# Patient Record
Sex: Male | Born: 1940 | Race: White | Hispanic: No | Marital: Married | Smoking: Former smoker
Health system: Southern US, Community
[De-identification: ages and names within clinical notes are randomized; demographics above are authoritative.]

## PROBLEM LIST (undated history)

## (undated) DIAGNOSIS — J309 Allergic rhinitis, unspecified: Secondary | ICD-10-CM

## (undated) DIAGNOSIS — N200 Calculus of kidney: Secondary | ICD-10-CM

## (undated) DIAGNOSIS — G4700 Insomnia, unspecified: Secondary | ICD-10-CM

## (undated) DIAGNOSIS — I219 Acute myocardial infarction, unspecified: Secondary | ICD-10-CM

## (undated) DIAGNOSIS — G4730 Sleep apnea, unspecified: Secondary | ICD-10-CM

## (undated) DIAGNOSIS — D472 Monoclonal gammopathy: Secondary | ICD-10-CM

## (undated) DIAGNOSIS — I25810 Atherosclerosis of coronary artery bypass graft(s) without angina pectoris: Secondary | ICD-10-CM

## (undated) DIAGNOSIS — K219 Gastro-esophageal reflux disease without esophagitis: Secondary | ICD-10-CM

## (undated) DIAGNOSIS — T7840XA Allergy, unspecified, initial encounter: Secondary | ICD-10-CM

## (undated) DIAGNOSIS — G40909 Epilepsy, unspecified, not intractable, without status epilepticus: Secondary | ICD-10-CM

## (undated) DIAGNOSIS — M810 Age-related osteoporosis without current pathological fracture: Secondary | ICD-10-CM

## (undated) DIAGNOSIS — K2270 Barrett's esophagus without dysplasia: Secondary | ICD-10-CM

## (undated) DIAGNOSIS — Z951 Presence of aortocoronary bypass graft: Secondary | ICD-10-CM

## (undated) DIAGNOSIS — E785 Hyperlipidemia, unspecified: Secondary | ICD-10-CM

## (undated) DIAGNOSIS — H25019 Cortical age-related cataract, unspecified eye: Secondary | ICD-10-CM

## (undated) DIAGNOSIS — H409 Unspecified glaucoma: Secondary | ICD-10-CM

## (undated) DIAGNOSIS — I10 Essential (primary) hypertension: Secondary | ICD-10-CM

## (undated) HISTORY — DX: Atherosclerosis of coronary artery bypass graft(s) without angina pectoris: I25.810

## (undated) HISTORY — DX: Epilepsy, unspecified, not intractable, without status epilepticus: G40.909

## (undated) HISTORY — DX: Essential (primary) hypertension: I10

## (undated) HISTORY — PX: CARDIAC SURGERY: SHX584

## (undated) HISTORY — PX: OTHER SURGICAL HISTORY: SHX169

## (undated) HISTORY — PX: FRACTURE SURGERY: SHX138

## (undated) HISTORY — DX: Cortical age-related cataract, unspecified eye: H25.019

## (undated) HISTORY — DX: Allergic rhinitis, unspecified: J30.9

## (undated) HISTORY — PX: VASECTOMY: SHX75

## (undated) HISTORY — DX: Barrett's esophagus without dysplasia: K22.70

## (undated) HISTORY — DX: Acute myocardial infarction, unspecified: I21.9

## (undated) HISTORY — DX: Sleep apnea, unspecified: G47.30

## (undated) HISTORY — DX: Gastro-esophageal reflux disease without esophagitis: K21.9

## (undated) HISTORY — PX: CORONARY ARTERY BYPASS GRAFT: SHX141

## (undated) HISTORY — DX: Insomnia, unspecified: G47.00

## (undated) HISTORY — DX: Monoclonal gammopathy: D47.2

## (undated) HISTORY — DX: Age-related osteoporosis without current pathological fracture: M81.0

## (undated) HISTORY — PX: TONSILLECTOMY: SUR1361

## (undated) HISTORY — DX: Hyperlipidemia, unspecified: E78.5

## (undated) HISTORY — PX: ANKLE SURGERY: SHX546

## (undated) HISTORY — DX: Presence of aortocoronary bypass graft: Z95.1

## (undated) HISTORY — DX: Calculus of kidney: N20.0

## (undated) HISTORY — DX: Unspecified glaucoma: H40.9

## (undated) HISTORY — DX: Allergy, unspecified, initial encounter: T78.40XA

---

## 2006-08-28 ENCOUNTER — Ambulatory Visit: Payer: Self-pay | Admitting: Otolaryngology

## 2011-12-07 ENCOUNTER — Emergency Department: Payer: Self-pay | Admitting: Emergency Medicine

## 2011-12-11 ENCOUNTER — Ambulatory Visit: Payer: Self-pay | Admitting: Orthopedic Surgery

## 2013-06-18 DIAGNOSIS — D696 Thrombocytopenia, unspecified: Secondary | ICD-10-CM | POA: Insufficient documentation

## 2014-04-20 DIAGNOSIS — I340 Nonrheumatic mitral (valve) insufficiency: Secondary | ICD-10-CM | POA: Insufficient documentation

## 2015-01-23 NOTE — Op Note (Signed)
PATIENT NAME:  Richard Summers, Richard Summers MR#:  [id number] DATE OF BIRTH:  1940/12/26  DATE OF PROCEDURE:  12/11/2011  PREOPERATIVE DIAGNOSIS: Trimalleolar right ankle fracture with metaphyseal extension.   POSTOPERATIVE DIAGNOSIS: Trimalleolar right ankle fracture with metaphyseal extension.   PROCEDURE: Open reduction and internal fixation medial and lateral malleoli.   SURGEON: Laurene Footman, MD  ANESTHESIA: Spinal.  DESCRIPTION OF PROCEDURE: The patient was brought to the Operating Room. The wound was checked, and there was a small fracture blister laterally that was not going to interfere with the incision, and so the patient was then given a spinal anesthetic. The right leg was prepped and draped in the usual sterile fashion with a tourniquet applied to the upper thigh and a bump underneath the right buttock. The tourniquet was inflated at the start of the case. The distal fibula was exposed and subcutaneous tissue elevated. A plate was contoured from the [employer] ALPS system with three locking screws placed in the distal fibula. The proximal plate was then exposed with a proximal incision getting the plate over the proximal fibular shaft. A K-wire was used to hold it in position. After getting the plate in the appropriate position, three nonlocking screws were placed proximally and the three locking screws distally into the lateral malleolus. This gave anatomic alignment to the fibula on AP and lateral projections. An anteromedial incision was then made to the distal tibia and medial malleolus. After exposure, a distal medial plate from the ALPS system was placed subcutaneously and a nonlocking screw inserted to lock the plate down. At the distal metaphysis, approximately three nonlocking screws were inserted to hold the plate, three nonlocking screws proximally to get the plate down to the bone. Multiple locking screws were then inserted distally to provide adequate buttress to the medial and metaphyseal  fractures. Essentially anatomic alignment appeared to have been obtained on both AP and lateral projections. The wounds were thoroughly irrigated and closed with 2-0 Vicryl subcutaneously and skin staples. Xeroform, 4 x 4s, Webril and stirrup splint were applied and a new bandage applied to a previous toe laceration. Condition to the recovery room  was stable.   ESTIMATED BLOOD LOSS: Minimal.    TOURNIQUET TIME: 65 minutes at 300 mmHg.   ____________________________ Laurene Footman, MD mjm:cbb D: 12/11/2011 16:15:08 ET T: 12/11/2011 17:49:25 ET JOB#: [id number]  cc: Laurene Footman, MD, <Dictator> Laurene Footman MD ELECTRONICALLY SIGNED 12/11/2011 18:30

## 2015-04-09 DIAGNOSIS — G4733 Obstructive sleep apnea (adult) (pediatric): Secondary | ICD-10-CM | POA: Insufficient documentation

## 2015-04-09 DIAGNOSIS — R7303 Prediabetes: Secondary | ICD-10-CM | POA: Insufficient documentation

## 2017-02-14 DIAGNOSIS — M79672 Pain in left foot: Secondary | ICD-10-CM | POA: Insufficient documentation

## 2017-07-25 DIAGNOSIS — M48061 Spinal stenosis, lumbar region without neurogenic claudication: Secondary | ICD-10-CM | POA: Insufficient documentation

## 2018-06-24 ENCOUNTER — Ambulatory Visit: Admit: 2018-06-24 | Payer: Self-pay | Admitting: Internal Medicine

## 2018-06-24 SURGERY — COLONOSCOPY WITH PROPOFOL
Anesthesia: General

## 2018-10-02 ENCOUNTER — Other Ambulatory Visit
Admission: RE | Admit: 2018-10-02 | Discharge: 2018-10-02 | Disposition: A | Discharge status: Home / Self Care | Payer: Medicare Other | Source: Ambulatory Visit | Attending: Internal Medicine | Admitting: Internal Medicine

## 2018-10-02 DIAGNOSIS — Z0000 Encounter for general adult medical examination without abnormal findings: Secondary | ICD-10-CM | POA: Diagnosis present

## 2018-10-02 DIAGNOSIS — M791 Myalgia, unspecified site: Secondary | ICD-10-CM | POA: Diagnosis present

## 2018-10-02 LAB — CK: CK TOTAL: 88 U/L (ref 49–397)

## 2019-03-18 DIAGNOSIS — I25810 Atherosclerosis of coronary artery bypass graft(s) without angina pectoris: Secondary | ICD-10-CM | POA: Insufficient documentation

## 2019-04-27 DIAGNOSIS — I255 Ischemic cardiomyopathy: Secondary | ICD-10-CM | POA: Insufficient documentation

## 2019-05-28 ENCOUNTER — Other Ambulatory Visit: Payer: Self-pay

## 2019-05-29 ENCOUNTER — Encounter: Payer: Self-pay | Admitting: Oncology

## 2019-05-29 ENCOUNTER — Inpatient Hospital Stay: Payer: Medicare Other

## 2019-05-29 ENCOUNTER — Inpatient Hospital Stay: Payer: Medicare Other | Attending: Oncology | Admitting: Oncology

## 2019-05-29 ENCOUNTER — Other Ambulatory Visit: Payer: Self-pay

## 2019-05-29 VITALS — BP 163/81 | HR 56 | Temp 98.0°F | Resp 16 | Wt 169.9 lb

## 2019-05-29 DIAGNOSIS — E785 Hyperlipidemia, unspecified: Secondary | ICD-10-CM | POA: Diagnosis not present

## 2019-05-29 DIAGNOSIS — G40909 Epilepsy, unspecified, not intractable, without status epilepticus: Secondary | ICD-10-CM

## 2019-05-29 DIAGNOSIS — Z79899 Other long term (current) drug therapy: Secondary | ICD-10-CM | POA: Diagnosis not present

## 2019-05-29 DIAGNOSIS — Z951 Presence of aortocoronary bypass graft: Secondary | ICD-10-CM | POA: Diagnosis not present

## 2019-05-29 DIAGNOSIS — Z87891 Personal history of nicotine dependence: Secondary | ICD-10-CM | POA: Diagnosis not present

## 2019-05-29 DIAGNOSIS — I10 Essential (primary) hypertension: Secondary | ICD-10-CM | POA: Diagnosis not present

## 2019-05-29 DIAGNOSIS — M810 Age-related osteoporosis without current pathological fracture: Secondary | ICD-10-CM | POA: Diagnosis not present

## 2019-05-29 DIAGNOSIS — D472 Monoclonal gammopathy: Secondary | ICD-10-CM | POA: Diagnosis present

## 2019-05-29 DIAGNOSIS — Z7982 Long term (current) use of aspirin: Secondary | ICD-10-CM

## 2019-05-29 DIAGNOSIS — I2510 Atherosclerotic heart disease of native coronary artery without angina pectoris: Secondary | ICD-10-CM | POA: Insufficient documentation

## 2019-05-29 DIAGNOSIS — I252 Old myocardial infarction: Secondary | ICD-10-CM | POA: Insufficient documentation

## 2019-05-29 LAB — CBC WITH DIFFERENTIAL/PLATELET
Abs Immature Granulocytes: 0.02 10*3/uL (ref 0.00–0.07)
Basophils Absolute: 0 10*3/uL (ref 0.0–0.1)
Basophils Relative: 1 %
Eosinophils Absolute: 0.2 10*3/uL (ref 0.0–0.5)
Eosinophils Relative: 4 %
HCT: 46.2 % (ref 39.0–52.0)
Hemoglobin: 16.1 g/dL (ref 13.0–17.0)
Immature Granulocytes: 0 %
Lymphocytes Relative: 16 %
Lymphs Abs: 1 10*3/uL (ref 0.7–4.0)
MCH: 32.9 pg (ref 26.0–34.0)
MCHC: 34.8 g/dL (ref 30.0–36.0)
MCV: 94.5 fL (ref 80.0–100.0)
Monocytes Absolute: 0.6 10*3/uL (ref 0.1–1.0)
Monocytes Relative: 10 %
Neutro Abs: 4.2 10*3/uL (ref 1.7–7.7)
Neutrophils Relative %: 69 %
Platelets: 146 10*3/uL — ABNORMAL LOW (ref 150–400)
RBC: 4.89 MIL/uL (ref 4.22–5.81)
RDW: 12.1 % (ref 11.5–15.5)
WBC: 6.1 10*3/uL (ref 4.0–10.5)
nRBC: 0 % (ref 0.0–0.2)

## 2019-05-29 LAB — COMPREHENSIVE METABOLIC PANEL
ALT: 22 U/L (ref 0–44)
AST: 18 U/L (ref 15–41)
Albumin: 4.6 g/dL (ref 3.5–5.0)
Alkaline Phosphatase: 74 U/L (ref 38–126)
Anion gap: 9 (ref 5–15)
BUN: 29 mg/dL — ABNORMAL HIGH (ref 8–23)
CO2: 26 mmol/L (ref 22–32)
Calcium: 9.8 mg/dL (ref 8.9–10.3)
Chloride: 107 mmol/L (ref 98–111)
Creatinine, Ser: 0.84 mg/dL (ref 0.61–1.24)
GFR calc Af Amer: >60 mL/min (ref >60)
GFR calc non Af Amer: >60 mL/min (ref >60)
Glucose, Bld: 122 mg/dL — ABNORMAL HIGH (ref 70–99)
Potassium: 4.3 mmol/L (ref 3.5–5.1)
Sodium: 142 mmol/L (ref 135–145)
Total Bilirubin: 1.1 mg/dL (ref 0.3–1.2)
Total Protein: 7.9 g/dL (ref 6.5–8.1)

## 2019-05-29 NOTE — Progress Notes (Addendum)
Hematology/Oncology Consult note [facility] Telephone:([phone] [phone]  Patient Care Team: Harrel Lemon, MD as PCP - General (Internal Medicine)   Name of the patient: Richard Summers  [id number]  1941-03-17    Reason for referral-abnormal M protein found on SPEP   Referring physician-Dr. Manuella Ghazi  Date of visit: 05/29/19   History of presenting illness- Patient is a 78 year old male with a past medical history significant for hyperlipidemia who has been having right lower extremity weakness and was recently seen by neurology.  Patient states that he was very active up until a year ago when he used to exercise in an apartment bike for 3 hours but presently is barely able to do it for 10 minutes.  He still ambulates independently but after he stands up for a while he feels as if his legs are giving way.  He did undergo nerve conduction studies with Dr. Manuella Ghazi as well.  As a work-up of his right lower extremity weakness patient had an SPEP checked which showed a monoclonal protein of 0.6 g IgM kappa.  Of note patient has a normal CBC and a CMP in April 2020.  His calcium levels in the past have also been normal.  He has been referred to [place] for abnormal SPEP  ECOG PS- 1  Pain scale- 0   Review of systems- Review of Systems  Constitutional: Positive for malaise/fatigue. Negative for chills, fever and weight loss.  HENT: Negative for congestion, ear discharge and nosebleeds.   Eyes: Negative for blurred vision.  Respiratory: Negative for cough, hemoptysis, sputum production, shortness of breath and wheezing.   Cardiovascular: Negative for chest pain, palpitations, orthopnea and claudication.  Gastrointestinal: Negative for abdominal pain, blood in stool, constipation, diarrhea, heartburn, melena, nausea and vomiting.  Genitourinary: Negative for dysuria, flank pain, frequency, hematuria and urgency.  Musculoskeletal: Negative for back pain, joint pain  and myalgias.  Skin: Negative for rash.  Neurological: Positive for weakness (Bilateral lower extremity weakness). Negative for dizziness, tingling, focal weakness, seizures and headaches.  Endo/Heme/Allergies: Does not bruise/bleed easily.  Psychiatric/Behavioral: Negative for depression and suicidal ideas. The patient does not have insomnia.     Allergies  Allergen Reactions  . Penicillins Anaphylaxis, Hives and Other (See Comments)  . Sulfa Antibiotics Anaphylaxis, Hives and Other (See Comments)  . Ezetimibe Other (See Comments)  . Rosuvastatin Other (See Comments)  . Statins     Other reaction(s): Other (See Comments) Confusion and mania     There are no active problems to display for this patient.    Past Medical History:  Diagnosis Date  . Allergic rhinitis   . Allergy   . Barrett's esophagus   . Cataract cortical, senile   . 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 Epilepsy ([place])   . GERD (gastroesophageal reflux disease)   . Glaucoma   . Hyperlipidemia   . Hypertension   . Insomnia   . Myocardial infarction ([place])   . Nephrolithiasis   . Osteoporosis   . S/P triple vessel bypass   . Sleep apnea      Past Surgical History:  Procedure Laterality Date  . ANKLE SURGERY    . broken leg    . CARDIAC SURGERY    . CORONARY ARTERY BYPASS GRAFT     triple  . FRACTURE SURGERY     Fibula and Tibia. Plates and screws installed  . TONSILLECTOMY    . triple bypass    .  VASECTOMY      Social History   Socioeconomic History  . Marital status: Married    Spouse name: Not on file  . Number of children: Not on file  . Years of education: Not on file  . Highest education level: Not on file  Occupational History  . Not on file  Social Needs  . Financial resource strain: Not on file  . Food insecurity    Worry: Not on file    Inability: Not on file  . Transportation needs    Medical: Not on file    Non-medical: Not on file   Tobacco Use  . Smoking status: Former Smoker    Types: 64, Pipe    Quit date: 10/29/2007    Years since quitting: 11.5  . Smokeless tobacco: Never Used  Substance and Sexual Activity  . Alcohol use: Yes  . Drug use: Never  . Sexual activity: Not Currently  Lifestyle  . Physical activity    Days per week: Not on file    Minutes per session: Not on file  . Stress: Not on file  Relationships  . Social Herbalist on phone: Not on file    Gets together: Not on file    Attends religious service: Not on file    Active member of club or organization: Not on file    Attends meetings of clubs or organizations: Not on file    Relationship status: Not on file  . Intimate partner violence    Fear of current or ex partner: Not on file    Emotionally abused: Not on file    Physically abused: Not on file    Forced sexual activity: Not on file  Other Topics Concern  . Not on file  Social History Narrative  . Not on file     Family History  Problem Relation Age of Onset  . Kidney cancer Father      Current Outpatient Medications:  .  aspirin EC 81 MG tablet, Take 81 mg by mouth daily., Disp: , Rfl:  .  azelastine (ASTELIN) 0.1 % nasal spray, Place into the nose., Disp: , Rfl:  .  cholestyramine (QUESTRAN) 4 GM/DOSE powder, Take 27 grams by mouth twice a day before meals. Do not take more than 54 grams per 24 hours., Disp: , Rfl:  .  hydrochlorothiazide (HYDRODIURIL) 25 MG tablet, Take by mouth., Disp: , Rfl:  .  latanoprost (XALATAN) 0.005 % ophthalmic solution, , Disp: , Rfl:  .  levETIRAcetam (KEPPRA XR) 500 MG 24 hr tablet, Take by mouth., Disp: , Rfl:  .  Loratadine 10 MG CAPS, Take by mouth., Disp: , Rfl:  .  losartan (COZAAR) 100 MG tablet, Take by mouth., Disp: , Rfl:  .  montelukast (SINGULAIR) 10 MG tablet, Take by mouth., Disp: , Rfl:  .  niacin (NIASPAN) 500 MG CR tablet, Take by mouth., Disp: , Rfl:  .  pantoprazole (PROTONIX) 40 MG tablet, Take by mouth.,  Disp: , Rfl:  .  Vitamin D, Ergocalciferol, (DRISDOL) 1.25 MG (50000 UT) CAPS capsule, Take by mouth., Disp: , Rfl:  .  clindamycin (CLEOCIN) 150 MG capsule, as needed For dental PRN, Disp: , Rfl:  .  clotrimazole (LOTRIMIN) 1 % cream, , Disp: , Rfl:    Physical exam:  Vitals:   05/29/19 1049  BP: (!) 163/81  Pulse: (!) 56  Resp: 16  Temp: 98 F (36.7 C)  TempSrc: Tympanic  SpO2: 96%  Weight: 169 lb 14.4 oz (77.1 kg)   Physical Exam Constitutional:      General: He is not in acute distress. HENT:     Head: Normocephalic and atraumatic.  Eyes:     Pupils: Pupils are equal, round, and reactive to light.  Neck:     Musculoskeletal: Normal range of motion.  Cardiovascular:     Rate and Rhythm: Normal rate and regular rhythm.     Heart sounds: Normal heart sounds.  Pulmonary:     Effort: Pulmonary effort is normal.     Breath sounds: Normal breath sounds.  Abdominal:     General: Bowel sounds are normal.     Palpations: Abdomen is soft.  Skin:    General: Skin is warm and dry.  Neurological:     Mental Status: He is alert and oriented to person, place, and time.     Sensory: No sensory deficit.     Motor: No weakness.     Gait: Gait normal.        CMP Latest Ref Rng & Units 05/29/2019  Glucose 70 - 99 mg/dL 122(H)  BUN 8 - 23 mg/dL 29(H)  Creatinine 0.61 - 1.24 mg/dL 0.84  Sodium 135 - 145 mmol/L 142  Potassium 3.5 - 5.1 mmol/L 4.3  Chloride 98 - 111 mmol/L 107  CO2 22 - 32 mmol/L 26  Calcium 8.9 - 10.3 mg/dL 9.8  Total Protein 6.5 - 8.1 g/dL 7.9  Total Bilirubin 0.3 - 1.2 mg/dL 1.1  Alkaline Phos 38 - 126 U/L 74  AST 15 - 41 U/L 18  ALT 0 - 44 U/L 22   CBC Latest Ref Rng & Units 05/29/2019  WBC 4.0 - 10.5 K/uL 6.1  Hemoglobin 13.0 - 17.0 g/dL 16.1  Hematocrit 39.0 - 52.0 % 46.2  Platelets 150 - 400 K/uL 146(L)   Assessment and plan- Patient is a 78 y.o. male referred for abnormal SPEP  Patient noted to have a small M protein of 0.6 g IgM kappa when  SPEP was checked as a part of work-up for his bilateral lower extremity weakness.  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  I do not feel that his MGUS is related to his bilateral lower extremity weakness.  He does not have any focal neurological deficit.  He has not had any spine imaging yet and that may be worth pursuing if his symptoms persist but I will defer those decisions to Dr. Edwina Barth and Dr. Manuella Ghazi.  I do not think that his MGUS is contributing to his symptoms presently.  However to work-up his MGUS I will check a CBC with differential, CMP, myeloma panel, serum free light chains and random urine protein electrophoresis.  I will see him back in 2 weeks time to discuss the results of his blood work and further management   Thank you for this kind referral and the opportunity to participate in the care of this patient   Visit Diagnosis 1. MGUS (monoclonal gammopathy of unknown significance)     Dr. Randa Evens, MD, MPH [facility] [id number] 05/29/2019 1:37 PM

## 2019-05-30 ENCOUNTER — Encounter: Payer: Self-pay | Admitting: Oncology

## 2019-06-01 LAB — KAPPA/LAMBDA LIGHT CHAINS
Kappa free light chain: 64.9 mg/L — ABNORMAL HIGH (ref 3.3–19.4)
Kappa, lambda light chain ratio: 6.12 — ABNORMAL HIGH (ref 0.26–1.65)
Lambda free light chains: 10.6 mg/L (ref 5.7–26.3)

## 2019-06-04 ENCOUNTER — Encounter: Payer: Self-pay | Admitting: Oncology

## 2019-06-11 NOTE — Progress Notes (Signed)
Patient coming in for follow up, he mentions he did the echeck in. He is fine no complaints.

## 2019-06-12 ENCOUNTER — Other Ambulatory Visit: Payer: Self-pay

## 2019-06-12 ENCOUNTER — Inpatient Hospital Stay: Payer: Medicare Other | Attending: Oncology | Admitting: Oncology

## 2019-06-12 VITALS — BP 146/82 | HR 61 | Temp 97.0°F | Resp 16 | Ht 63.0 in | Wt 169.5 lb

## 2019-06-12 DIAGNOSIS — E785 Hyperlipidemia, unspecified: Secondary | ICD-10-CM | POA: Diagnosis not present

## 2019-06-12 DIAGNOSIS — Z7982 Long term (current) use of aspirin: Secondary | ICD-10-CM | POA: Diagnosis not present

## 2019-06-12 DIAGNOSIS — Z87891 Personal history of nicotine dependence: Secondary | ICD-10-CM | POA: Insufficient documentation

## 2019-06-12 DIAGNOSIS — D472 Monoclonal gammopathy: Secondary | ICD-10-CM | POA: Insufficient documentation

## 2019-06-12 DIAGNOSIS — Z79899 Other long term (current) drug therapy: Secondary | ICD-10-CM | POA: Diagnosis not present

## 2019-06-12 NOTE — Progress Notes (Signed)
Pt here today to get blood results and determine what might be causing his kappa/lambda to be elevated

## 2019-06-15 LAB — PROTEIN ELECTRO, RANDOM URINE
Albumin ELP, Urine: 14.4 %
Alpha-1-Globulin, U: 7.3 %
Alpha-2-Globulin, U: 16.7 %
Beta Globulin, U: 25.6 %
Gamma Globulin, U: 36 %
M Component, Ur: 14.8 % — ABNORMAL HIGH
Total Protein, Urine: 13.3 mg/dL

## 2019-06-15 NOTE — Progress Notes (Signed)
Hematology/Oncology Consult note [name]  Telephone:([phone] [phone]  Patient Care Team: Harrel Lemon, MD as PCP - General (Internal Medicine)   Name of the patient: Richard Summers  [id number]  [birth year]   Date of visit: 06/15/19  Diagnosis- IgM MGUS  Chief complaint/ Reason for visit-discuss results of blood work  Heme/Onc history: Patient is a 78 year old male with a past medical history significant for hyperlipidemia who has been having right lower extremity weakness and was recently seen by neurology.  Patient states that he was very active up until a year ago when he used to exercise in an apartment bike for 3 hours but presently is barely able to do it for 10 minutes.  He still ambulates independently but after he stands up for a while he feels as if his legs are giving way.  He did undergo nerve conduction studies with Dr. Manuella Ghazi as well.  As a work-up of his right lower extremity weakness patient had an SPEP checked which showed a monoclonal protein of 0.6 g IgM kappa.  Of note patient has a normal CBC and a CMP in April 2020.  His calcium levels in the past have also been normal.  He has been referred to [place] for abnormal SPEP   Interval history-he still has ongoing problems with bilateral lower extremity weakness.  Denies any new complaints at this time  ECOG PS- 1 Pain scale- 0   Review of systems- Review of Systems  Constitutional: Positive for malaise/fatigue. Negative for chills, fever and weight loss.  HENT: Negative for congestion, ear discharge and nosebleeds.   Eyes: Negative for blurred vision.  Respiratory: Negative for cough, hemoptysis, sputum production, shortness of breath and wheezing.   Cardiovascular: Negative for chest pain, palpitations, orthopnea and claudication.  Gastrointestinal: Negative for abdominal pain, blood in stool, constipation, diarrhea, heartburn, melena, nausea and vomiting.  Genitourinary:  Negative for dysuria, flank pain, frequency, hematuria and urgency.  Musculoskeletal: Negative for back pain, joint pain and myalgias.  Skin: Negative for rash.  Neurological: Positive for weakness (Bilateral lower extremity weakness). Negative for dizziness, tingling, focal weakness, seizures and headaches.  Endo/Heme/Allergies: Does not bruise/bleed easily.  Psychiatric/Behavioral: Negative for depression and suicidal ideas. The patient does not have insomnia.        Allergies  Allergen Reactions  . Penicillins Anaphylaxis, Hives and Other (See Comments)  . Sulfa Antibiotics Anaphylaxis, Hives and Other (See Comments)  . Ezetimibe Other (See Comments)  . Rosuvastatin Other (See Comments)  . Statins     Other reaction(s): Other (See Comments) Confusion and mania      Past Medical History:  Diagnosis Date  . Allergic rhinitis   . Allergy   . Barrett's esophagus   . Cataract cortical, senile   . 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 Epilepsy ([place])   . GERD (gastroesophageal reflux disease)   . Glaucoma   . Hyperlipidemia   . Hypertension   . Insomnia   . Myocardial infarction ([place])   . Nephrolithiasis   . Osteoporosis   . S/P triple vessel bypass   . Sleep apnea      Past Surgical History:  Procedure Laterality Date  . ANKLE SURGERY    . broken leg    . CARDIAC SURGERY    . CORONARY ARTERY BYPASS GRAFT     triple  . FRACTURE SURGERY     Fibula and Tibia. Plates and screws installed  .  TONSILLECTOMY    . triple bypass    . VASECTOMY      Social History   Socioeconomic History  . Marital status: Married    Spouse name: Not on file  . Number of children: Not on file  . Years of education: Not on file  . Highest education level: Not on file  Occupational History  . Not on file  Social Needs  . Financial resource strain: Not on file  . Food insecurity    Worry: Not on file    Inability: Not on file  .  Transportation needs    Medical: Not on file    Non-medical: Not on file  Tobacco Use  . Smoking status: Former Smoker    Types: 71, Pipe    Quit date: 10/29/2007    Years since quitting: 11.6  . Smokeless tobacco: Never Used  Substance and Sexual Activity  . Alcohol use: Yes  . Drug use: Never  . Sexual activity: Not Currently  Lifestyle  . Physical activity    Days per week: Not on file    Minutes per session: Not on file  . Stress: Not on file  Relationships  . Social Herbalist on phone: Not on file    Gets together: Not on file    Attends religious service: Not on file    Active member of club or organization: Not on file    Attends meetings of clubs or organizations: Not on file    Relationship status: Not on file  . Intimate partner violence    Fear of current or ex partner: Not on file    Emotionally abused: Not on file    Physically abused: Not on file    Forced sexual activity: Not on file  Other Topics Concern  . Not on file  Social History Narrative  . Not on file    Family History  Problem Relation Age of Onset  . Seizures Mother   . Kidney cancer Father      Current Outpatient Medications:  .  aspirin EC 81 MG tablet, Take 81 mg by mouth daily., Disp: , Rfl:  .  azelastine (ASTELIN) 0.1 % nasal spray, Place into the nose., Disp: , Rfl:  .  cholestyramine (QUESTRAN) 4 GM/DOSE powder, Take 27 grams by mouth twice a day before meals. Do not take more than 54 grams per 24 hours., Disp: , Rfl:  .  clindamycin (CLEOCIN) 150 MG capsule, as needed For dental PRN, Disp: , Rfl:  .  clotrimazole (LOTRIMIN) 1 % cream, , Disp: , Rfl:  .  hydrochlorothiazide (HYDRODIURIL) 25 MG tablet, Take by mouth., Disp: , Rfl:  .  latanoprost (XALATAN) 0.005 % ophthalmic solution, , Disp: , Rfl:  .  levETIRAcetam (KEPPRA XR) 500 MG 24 hr tablet, Take by mouth., Disp: , Rfl:  .  Loratadine 10 MG CAPS, Take by mouth., Disp: , Rfl:  .  losartan (COZAAR) 100 MG  tablet, Take by mouth., Disp: , Rfl:  .  montelukast (SINGULAIR) 10 MG tablet, Take by mouth., Disp: , Rfl:  .  niacin (NIASPAN) 500 MG CR tablet, Take by mouth., Disp: , Rfl:  .  pantoprazole (PROTONIX) 40 MG tablet, Take by mouth., Disp: , Rfl:  .  Vitamin D, Ergocalciferol, (DRISDOL) 1.25 MG (50000 UT) CAPS capsule, Take by mouth., Disp: , Rfl:   Physical exam:  Vitals:   06/12/19 1120  BP: (!) 146/82  Pulse: 61  Resp: 16  Temp: (!) 97 F (36.1 C)  TempSrc: Tympanic  Weight: 169 lb 8 oz (76.9 kg)  Height: 5' 3"  (1.6 m)   Physical Exam Constitutional:      Comments: Elderly gentleman who ambulates with a cane.  Appears in no acute distress  HENT:     Head: Normocephalic and atraumatic.  Eyes:     Pupils: Pupils are equal, round, and reactive to light.  Neck:     Musculoskeletal: Normal range of motion.  Cardiovascular:     Rate and Rhythm: Normal rate and regular rhythm.     Heart sounds: Normal heart sounds.  Pulmonary:     Effort: Pulmonary effort is normal.     Breath sounds: Normal breath sounds.  Abdominal:     General: Bowel sounds are normal.     Palpations: Abdomen is soft.  Skin:    General: Skin is warm and dry.  Neurological:     General: No focal deficit present.     Mental Status: He is alert and oriented to person, place, and time.      CMP Latest Ref Rng & Units 05/29/2019  Glucose 70 - 99 mg/dL 122(H)  BUN 8 - 23 mg/dL 29(H)  Creatinine 0.61 - 1.24 mg/dL 0.84  Sodium 135 - 145 mmol/L 142  Potassium 3.5 - 5.1 mmol/L 4.3  Chloride 98 - 111 mmol/L 107  CO2 22 - 32 mmol/L 26  Calcium 8.9 - 10.3 mg/dL 9.8  Total Protein 6.5 - 8.1 g/dL 7.9  Total Bilirubin 0.3 - 1.2 mg/dL 1.1  Alkaline Phos 38 - 126 U/L 74  AST 15 - 41 U/L 18  ALT 0 - 44 U/L 22   CBC Latest Ref Rng & Units 05/29/2019  WBC 4.0 - 10.5 K/uL 6.1  Hemoglobin 13.0 - 17.0 g/dL 16.1  Hematocrit 39.0 - 52.0 % 46.2  Platelets 150 - 400 K/uL 146(L)     Assessment and plan- Patient is  a 78 y.o. male referred for abnormal SPEP consistent with MGUS  I reviewed the results of blood work done on 05/29/2019 which did not reveal any evidence of anemia. He does not have any chronic kidney disease and his calcium levels are normal.  Serum free light chain ratio is elevated at 6.1 with elevated kappa of 64.  He was found to have a 0.6 g IgM kappa light chain on his recent SPEP done at [facility] clinic.  This is consistent with IgM MGUS.  Given that his free light chain ratio is less than 8, I will hold off on bone marrow biopsy at this time.  Patient is yet to give [place] a urine sample to assess for urine protein.  He has a small amount of M protein and I do not think that this would be contributing to his neuropathy at this time.  I will hold off on bone marrow biopsy unless there is an increase in his free light chain ratio to greater than 8.  Repeat CBC with differential, CMP, myeloma panel and free light chains in 3 months in 6 months and I will see him back in 6 months.  Discussed natural history of MGUS and risk of progression to overt multiple myeloma.  Typically IgM myeloma is rare and there may be a concern for progression to [place]'s macroglobulinemia in the future. Visit Diagnosis 1. MGUS (monoclonal gammopathy of unknown significance)      Dr. Randa Evens, MD, MPH [facility] [phone] 06/15/2019 10:59 AM

## 2019-07-06 DIAGNOSIS — D472 Monoclonal gammopathy: Secondary | ICD-10-CM | POA: Insufficient documentation

## 2019-08-06 DIAGNOSIS — C4492 Squamous cell carcinoma of skin, unspecified: Secondary | ICD-10-CM

## 2019-08-06 HISTORY — DX: Squamous cell carcinoma of skin, unspecified: C44.92

## 2019-09-10 ENCOUNTER — Other Ambulatory Visit: Payer: Self-pay

## 2019-09-11 ENCOUNTER — Inpatient Hospital Stay: Payer: Medicare Other | Attending: Oncology

## 2019-09-11 DIAGNOSIS — D472 Monoclonal gammopathy: Secondary | ICD-10-CM | POA: Insufficient documentation

## 2019-09-11 LAB — COMPREHENSIVE METABOLIC PANEL
ALT: 18 U/L (ref 0–44)
AST: 18 U/L (ref 15–41)
Albumin: 4.4 g/dL (ref 3.5–5.0)
Alkaline Phosphatase: 64 U/L (ref 38–126)
Anion gap: 9 (ref 5–15)
BUN: 31 mg/dL — ABNORMAL HIGH (ref 8–23)
CO2: 27 mmol/L (ref 22–32)
Calcium: 9.7 mg/dL (ref 8.9–10.3)
Chloride: 107 mmol/L (ref 98–111)
Creatinine, Ser: 0.77 mg/dL (ref 0.61–1.24)
GFR calc Af Amer: >60 mL/min (ref >60)
GFR calc non Af Amer: >60 mL/min (ref >60)
Glucose, Bld: 131 mg/dL — ABNORMAL HIGH (ref 70–99)
Potassium: 4.3 mmol/L (ref 3.5–5.1)
Sodium: 143 mmol/L (ref 135–145)
Total Bilirubin: 1.5 mg/dL — ABNORMAL HIGH (ref 0.3–1.2)
Total Protein: 7.5 g/dL (ref 6.5–8.1)

## 2019-09-11 LAB — CBC WITH DIFFERENTIAL/PLATELET
Abs Immature Granulocytes: 0.02 10*3/uL (ref 0.00–0.07)
Basophils Absolute: 0 10*3/uL (ref 0.0–0.1)
Basophils Relative: 1 %
Eosinophils Absolute: 0.2 10*3/uL (ref 0.0–0.5)
Eosinophils Relative: 3 %
HCT: 45.3 % (ref 39.0–52.0)
Hemoglobin: 15.4 g/dL (ref 13.0–17.0)
Immature Granulocytes: 0 %
Lymphocytes Relative: 24 %
Lymphs Abs: 1.4 10*3/uL (ref 0.7–4.0)
MCH: 32.9 pg (ref 26.0–34.0)
MCHC: 34 g/dL (ref 30.0–36.0)
MCV: 96.8 fL (ref 80.0–100.0)
Monocytes Absolute: 0.6 10*3/uL (ref 0.1–1.0)
Monocytes Relative: 11 %
Neutro Abs: 3.5 10*3/uL (ref 1.7–7.7)
Neutrophils Relative %: 61 %
Platelets: 133 10*3/uL — ABNORMAL LOW (ref 150–400)
RBC: 4.68 MIL/uL (ref 4.22–5.81)
RDW: 12.9 % (ref 11.5–15.5)
WBC: 5.8 10*3/uL (ref 4.0–10.5)
nRBC: 0 % (ref 0.0–0.2)

## 2019-09-14 LAB — KAPPA/LAMBDA LIGHT CHAINS
Kappa free light chain: 73.4 mg/L — ABNORMAL HIGH (ref 3.3–19.4)
Kappa, lambda light chain ratio: 9.06 — ABNORMAL HIGH (ref 0.26–1.65)
Lambda free light chains: 8.1 mg/L (ref 5.7–26.3)

## 2019-09-16 LAB — MULTIPLE MYELOMA PANEL, SERUM
Albumin SerPl Elph-Mcnc: 3.9 g/dL (ref 2.9–4.4)
Albumin/Glob SerPl: 1.4 (ref 0.7–1.7)
Alpha 1: 0.2 g/dL (ref 0.0–0.4)
Alpha2 Glob SerPl Elph-Mcnc: 0.7 g/dL (ref 0.4–1.0)
B-Globulin SerPl Elph-Mcnc: 0.9 g/dL (ref 0.7–1.3)
Gamma Glob SerPl Elph-Mcnc: 1.1 g/dL (ref 0.4–1.8)
Globulin, Total: 2.9 g/dL (ref 2.2–3.9)
IgA: 52 mg/dL — ABNORMAL LOW (ref 61–437)
IgG (Immunoglobin G), Serum: 608 mg/dL (ref 603–1613)
IgM (Immunoglobulin M), Srm: 774 mg/dL — ABNORMAL HIGH (ref 15–143)
M Protein SerPl Elph-Mcnc: 0.6 g/dL — ABNORMAL HIGH
Total Protein ELP: 6.8 g/dL (ref 6.0–8.5)

## 2019-11-04 ENCOUNTER — Telehealth: Payer: Self-pay | Admitting: Oncology

## 2019-11-04 NOTE — Telephone Encounter (Signed)
MD will not be in the office on 12-11-19. Lab and MD appts rescheduled for 12-18-19. Patient is aware.

## 2019-12-11 ENCOUNTER — Ambulatory Visit: Payer: Medicare Other | Admitting: Oncology

## 2019-12-11 ENCOUNTER — Other Ambulatory Visit: Payer: Medicare Other

## 2019-12-18 ENCOUNTER — Inpatient Hospital Stay: Payer: Medicare Other | Attending: Oncology

## 2019-12-18 ENCOUNTER — Inpatient Hospital Stay (HOSPITAL_BASED_OUTPATIENT_CLINIC_OR_DEPARTMENT_OTHER): Payer: Medicare Other | Admitting: Oncology

## 2019-12-18 ENCOUNTER — Encounter: Payer: Self-pay | Admitting: Oncology

## 2019-12-18 ENCOUNTER — Other Ambulatory Visit: Payer: Self-pay

## 2019-12-18 VITALS — BP 178/89 | HR 68 | Temp 98.7°F | Ht 63.0 in | Wt 169.0 lb

## 2019-12-18 DIAGNOSIS — G40319 Generalized idiopathic epilepsy and epileptic syndromes, intractable, without status epilepticus: Secondary | ICD-10-CM | POA: Insufficient documentation

## 2019-12-18 DIAGNOSIS — R768 Other specified abnormal immunological findings in serum: Secondary | ICD-10-CM

## 2019-12-18 DIAGNOSIS — Z87891 Personal history of nicotine dependence: Secondary | ICD-10-CM | POA: Insufficient documentation

## 2019-12-18 DIAGNOSIS — Z7982 Long term (current) use of aspirin: Secondary | ICD-10-CM | POA: Insufficient documentation

## 2019-12-18 DIAGNOSIS — E785 Hyperlipidemia, unspecified: Secondary | ICD-10-CM | POA: Insufficient documentation

## 2019-12-18 DIAGNOSIS — J309 Allergic rhinitis, unspecified: Secondary | ICD-10-CM | POA: Insufficient documentation

## 2019-12-18 DIAGNOSIS — D472 Monoclonal gammopathy: Secondary | ICD-10-CM

## 2019-12-18 DIAGNOSIS — H409 Unspecified glaucoma: Secondary | ICD-10-CM | POA: Insufficient documentation

## 2019-12-18 DIAGNOSIS — I10 Essential (primary) hypertension: Secondary | ICD-10-CM | POA: Insufficient documentation

## 2019-12-18 DIAGNOSIS — G4700 Insomnia, unspecified: Secondary | ICD-10-CM | POA: Insufficient documentation

## 2019-12-18 DIAGNOSIS — Z79899 Other long term (current) drug therapy: Secondary | ICD-10-CM | POA: Insufficient documentation

## 2019-12-18 DIAGNOSIS — K2270 Barrett's esophagus without dysplasia: Secondary | ICD-10-CM | POA: Insufficient documentation

## 2019-12-18 DIAGNOSIS — E782 Mixed hyperlipidemia: Secondary | ICD-10-CM | POA: Insufficient documentation

## 2019-12-18 DIAGNOSIS — M810 Age-related osteoporosis without current pathological fracture: Secondary | ICD-10-CM | POA: Insufficient documentation

## 2019-12-18 LAB — COMPREHENSIVE METABOLIC PANEL
ALT: 18 U/L (ref 0–44)
AST: 20 U/L (ref 15–41)
Albumin: 4.8 g/dL (ref 3.5–5.0)
Alkaline Phosphatase: 81 U/L (ref 38–126)
Anion gap: 10 (ref 5–15)
BUN: 26 mg/dL — ABNORMAL HIGH (ref 8–23)
CO2: 25 mmol/L (ref 22–32)
Calcium: 9.6 mg/dL (ref 8.9–10.3)
Chloride: 104 mmol/L (ref 98–111)
Creatinine, Ser: 0.96 mg/dL (ref 0.61–1.24)
GFR calc Af Amer: >60 mL/min (ref >60)
GFR calc non Af Amer: >60 mL/min (ref >60)
Glucose, Bld: 131 mg/dL — ABNORMAL HIGH (ref 70–99)
Potassium: 3.8 mmol/L (ref 3.5–5.1)
Sodium: 139 mmol/L (ref 135–145)
Total Bilirubin: 1.3 mg/dL — ABNORMAL HIGH (ref 0.3–1.2)
Total Protein: 8.2 g/dL — ABNORMAL HIGH (ref 6.5–8.1)

## 2019-12-18 LAB — CBC WITH DIFFERENTIAL/PLATELET
Abs Immature Granulocytes: 0.03 10*3/uL (ref 0.00–0.07)
Basophils Absolute: 0 10*3/uL (ref 0.0–0.1)
Basophils Relative: 1 %
Eosinophils Absolute: 0.2 10*3/uL (ref 0.0–0.5)
Eosinophils Relative: 4 %
HCT: 46.3 % (ref 39.0–52.0)
Hemoglobin: 16.1 g/dL (ref 13.0–17.0)
Immature Granulocytes: 1 %
Lymphocytes Relative: 24 %
Lymphs Abs: 1.4 10*3/uL (ref 0.7–4.0)
MCH: 33.2 pg (ref 26.0–34.0)
MCHC: 34.8 g/dL (ref 30.0–36.0)
MCV: 95.5 fL (ref 80.0–100.0)
Monocytes Absolute: 0.6 10*3/uL (ref 0.1–1.0)
Monocytes Relative: 10 %
Neutro Abs: 3.4 10*3/uL (ref 1.7–7.7)
Neutrophils Relative %: 60 %
Platelets: 137 10*3/uL — ABNORMAL LOW (ref 150–400)
RBC: 4.85 MIL/uL (ref 4.22–5.81)
RDW: 12.8 % (ref 11.5–15.5)
WBC: 5.7 10*3/uL (ref 4.0–10.5)
nRBC: 0 % (ref 0.0–0.2)

## 2019-12-18 NOTE — Progress Notes (Signed)
Patient stated that she had been doing well with no complaints. 

## 2019-12-21 NOTE — Progress Notes (Signed)
Hematology/Oncology Consult note [facility]  [phone] [phone]  Patient Care Team: Harrel Lemon, MD as PCP - General (Internal Medicine) Sindy Guadeloupe, MD as Consulting Physician (Oncology)   Name of the patient: Richard Summers  [id number]  1941-01-21   Date of visit: 12/21/19  Diagnosis- IgM MGUS  Chief complaint/ Reason for visit-routine follow-up of IgM MGUS  Heme/Onc history: Patient is a 79 year old male with a past medical history significant for hyperlipidemia who has been having right lower extremity weakness and was recently seen by neurology. Patient states that he was very active up until a year ago when he used to exercise in an apartment bike for 3 hours but presently is barely able to do it for 10 minutes. He still ambulates independently but after he stands up for a while he feels as if his legs are giving way. He did undergo nerve conduction studies with Dr. Manuella Ghazi as well. As a work-up of his right lower extremity weakness patient had an SPEP checked which showed a monoclonal protein of 0.6 g IgM kappa. Of note patient has a normal CBC and a CMP in April 2020. His calcium levels in the past have also been normal.  Results of blood work from August 2020 showed normal CBC with a mild thrombocytopenia of 146.  Serum creatinine and calcium were normal.  Serum free light chain was elevated at 6.1.  Interval history-no significant changes in his health over the last 6 months.  He is trying to get his Covid vaccine.  He cares for his wife who is essentially bedridden.  He has not had any recent falls.  ECOG PS- 1 Pain scale- 0   Review of systems- Review of Systems  Constitutional: Positive for malaise/fatigue. Negative for chills, fever and weight loss.  HENT: Negative for congestion, ear discharge and nosebleeds.   Eyes: Negative for blurred vision.  Respiratory: Negative for cough, hemoptysis, sputum  production, shortness of breath and wheezing.   Cardiovascular: Negative for chest pain, palpitations, orthopnea and claudication.  Gastrointestinal: Negative for abdominal pain, blood in stool, constipation, diarrhea, heartburn, melena, nausea and vomiting.  Genitourinary: Negative for dysuria, flank pain, frequency, hematuria and urgency.  Musculoskeletal: Negative for back pain, joint pain and myalgias.  Skin: Negative for rash.  Neurological: Negative for dizziness, tingling, focal weakness, seizures, weakness and headaches.  Endo/Heme/Allergies: Does not bruise/bleed easily.  Psychiatric/Behavioral: Negative for depression and suicidal ideas. The patient does not have insomnia.       Allergies  Allergen Reactions  . Penicillins Anaphylaxis, Hives and Other (See Comments)  . Sulfa Antibiotics Anaphylaxis, Hives and Other (See Comments)  . Ezetimibe Other (See Comments)  . Rosuvastatin Other (See Comments)  . Statins     Other reaction(s): Other (See Comments) Confusion and mania      Past Medical History:  Diagnosis Date  . Allergic rhinitis   . Allergy   . Barrett's esophagus   . Cataract cortical, senile   . 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 Epilepsy ([place])   . GERD (gastroesophageal reflux disease)   . Glaucoma   . Hyperlipidemia   . Hypertension   . Insomnia   . Myocardial infarction ([place])   . Nephrolithiasis   . Osteoporosis   . S/P triple vessel bypass   . Sleep apnea      Past Surgical History:  Procedure Laterality Date  . ANKLE SURGERY    .  broken leg    . CARDIAC SURGERY    . CORONARY ARTERY BYPASS GRAFT     triple  . FRACTURE SURGERY     Fibula and Tibia. Plates and screws installed  . TONSILLECTOMY    . triple bypass    . VASECTOMY      Social History   Socioeconomic History  . Marital status: Married    Spouse name: Not on file  . Number of children: Not on file  . Years of education: Not on  file  . Highest education level: Not on file  Occupational History  . Not on file  Tobacco Use  . Smoking status: Former Smoker    Types: 6, Pipe    Quit date: 10/29/2007    Years since quitting: 12.1  . Smokeless tobacco: Never Used  Substance and Sexual Activity  . Alcohol use: Yes  . Drug use: Never  . Sexual activity: Not Currently  Other Topics Concern  . Not on file  Social History Narrative  . Not on file   Social Determinants of Health   Financial Resource Strain:   . Difficulty of Paying Living Expenses:   Food Insecurity:   . Worried About Charity fundraiser in the Last Year:   . Arboriculturist in the Last Year:   Transportation Needs:   . Film/video editor (Medical):   Marland Kitchen Lack of Transportation (Non-Medical):   Physical Activity:   . Days of Exercise per Week:   . Minutes of Exercise per Session:   Stress:   . Feeling of Stress :   Social Connections:   . Frequency of Communication with Friends and Family:   . Frequency of Social Gatherings with Friends and Family:   . Attends Religious Services:   . Active Member of Clubs or Organizations:   . Attends Archivist Meetings:   Marland Kitchen Marital Status:   Intimate Partner Violence:   . Fear of Current or Ex-Partner:   . Emotionally Abused:   Marland Kitchen Physically Abused:   . Sexually Abused:     Family History  Problem Relation Age of Onset  . Seizures Mother   . Kidney cancer Father      Current Outpatient Medications:  .  aspirin EC 81 MG tablet, Take 81 mg by mouth daily., Disp: , Rfl:  .  azelastine (ASTELIN) 0.1 % nasal spray, Place into the nose., Disp: , Rfl:  .  cholestyramine (QUESTRAN) 4 GM/DOSE powder, Take 27 grams by mouth twice a day before meals. Do not take more than 54 grams per 24 hours., Disp: , Rfl:  .  clindamycin (CLEOCIN) 150 MG capsule, as needed For dental PRN, Disp: , Rfl:  .  hydrochlorothiazide (HYDRODIURIL) 25 MG tablet, Take by mouth., Disp: , Rfl:  .  latanoprost  (XALATAN) 0.005 % ophthalmic solution, , Disp: , Rfl:  .  levETIRAcetam (KEPPRA XR) 500 MG 24 hr tablet, Take by mouth., Disp: , Rfl:  .  Loratadine 10 MG CAPS, Take by mouth., Disp: , Rfl:  .  losartan (COZAAR) 100 MG tablet, Take by mouth., Disp: , Rfl:  .  montelukast (SINGULAIR) 10 MG tablet, Take by mouth., Disp: , Rfl:  .  niacin (NIASPAN) 500 MG CR tablet, Take by mouth., Disp: , Rfl:  .  pantoprazole (PROTONIX) 40 MG tablet, Take by mouth., Disp: , Rfl:   Physical exam:  Vitals:   12/18/19 1123  BP: (!) 178/89  Pulse: 68  Temp:  98.7 F (37.1 C)  TempSrc: Tympanic  Weight: 169 lb (76.7 kg)  Height: 5' 3" (1.6 m)   Physical Exam Constitutional:      Comments: Ambulates with a cane  HENT:     Head: Normocephalic and atraumatic.  Cardiovascular:     Rate and Rhythm: Normal rate and regular rhythm.     Heart sounds: Normal heart sounds.  Pulmonary:     Effort: Pulmonary effort is normal.     Breath sounds: Normal breath sounds.  Abdominal:     General: Bowel sounds are normal.     Palpations: Abdomen is soft.  Skin:    General: Skin is warm and dry.  Neurological:     Mental Status: He is alert and oriented to person, place, and time.      CMP Latest Ref Rng & Units 12/18/2019  Glucose 70 - 99 mg/dL 131(H)  BUN 8 - 23 mg/dL 26(H)  Creatinine 0.61 - 1.24 mg/dL 0.96  Sodium 135 - 145 mmol/L 139  Potassium 3.5 - 5.1 mmol/L 3.8  Chloride 98 - 111 mmol/L 104  CO2 22 - 32 mmol/L 25  Calcium 8.9 - 10.3 mg/dL 9.6  Total Protein 6.5 - 8.1 g/dL 8.2(H)  Total Bilirubin 0.3 - 1.2 mg/dL 1.3(H)  Alkaline Phos 38 - 126 U/L 81  AST 15 - 41 U/L 20  ALT 0 - 44 U/L 18   CBC Latest Ref Rng & Units 12/18/2019  WBC 4.0 - 10.5 K/uL 5.7  Hemoglobin 13.0 - 17.0 g/dL 16.1  Hematocrit 39.0 - 52.0 % 46.3  Platelets 150 - 400 K/uL 137(L)     Assessment and plan- Patient is a 79 y.o. male with IgM MGUS here for routine follow-up  Comparing his labs between August and December 2020  patient's free light chain ratio did increase from 6 to 9.  Serum free kappa light chain was elevated at 73.  Myeloma panel in December 2020 showed IgM kappa light chain specificity of 0.6.  Today patient CBC and CMP is unremarkable.  Myeloma panel and serum free light chain is still pending.  Given that his free light chain ratio is greater than 8 I would ideally like to proceed with a bone marrow biopsy at this time to rule out any underlying plasma cell dyscrasia such as l Walden Strom's macroglobulinemia given his IgM paraprotein..  I will wait for his labs from today to be back and we will call the patient with those results and arrange for a bone marrow biopsy at that time.  Follow-up to be determined based on bone marrow biopsy findings   Visit Diagnosis 1. MGUS (monoclonal gammopathy of unknown significance)   2. Elevated serum immunoglobulin free light chain level      Dr. Randa Evens, MD, MPH [facility] [phone] 12/21/2019 4:09 PM

## 2019-12-22 LAB — KAPPA/LAMBDA LIGHT CHAINS
Kappa free light chain: 71.5 mg/L — ABNORMAL HIGH (ref 3.3–19.4)
Kappa, lambda light chain ratio: 5.3 — ABNORMAL HIGH (ref 0.26–1.65)
Lambda free light chains: 13.5 mg/L (ref 5.7–26.3)

## 2019-12-22 LAB — MULTIPLE MYELOMA PANEL, SERUM
Albumin SerPl Elph-Mcnc: 4 g/dL (ref 2.9–4.4)
Albumin/Glob SerPl: 1.3 (ref 0.7–1.7)
Alpha 1: 0.2 g/dL (ref 0.0–0.4)
Alpha2 Glob SerPl Elph-Mcnc: 0.8 g/dL (ref 0.4–1.0)
B-Globulin SerPl Elph-Mcnc: 0.9 g/dL (ref 0.7–1.3)
Gamma Glob SerPl Elph-Mcnc: 1.2 g/dL (ref 0.4–1.8)
Globulin, Total: 3.1 g/dL (ref 2.2–3.9)
IgA: 59 mg/dL — ABNORMAL LOW (ref 61–437)
IgG (Immunoglobin G), Serum: 643 mg/dL (ref 603–1613)
IgM (Immunoglobulin M), Srm: 834 mg/dL — ABNORMAL HIGH (ref 15–143)
M Protein SerPl Elph-Mcnc: 0.7 g/dL — ABNORMAL HIGH
Total Protein ELP: 7.1 g/dL (ref 6.0–8.5)

## 2019-12-23 ENCOUNTER — Encounter: Payer: Self-pay | Admitting: Oncology

## 2019-12-24 NOTE — Telephone Encounter (Signed)
Yes that's correct. Continue to monitor. Labs in 3 and 6 months. See me in 6 months

## 2019-12-31 ENCOUNTER — Ambulatory Visit: Payer: Medicare Other | Attending: Internal Medicine

## 2019-12-31 ENCOUNTER — Ambulatory Visit: Payer: Medicare Other

## 2019-12-31 DIAGNOSIS — Z23 Encounter for immunization: Secondary | ICD-10-CM

## 2019-12-31 NOTE — Progress Notes (Signed)
   Covid-19 Vaccination Clinic  Name:  Richard Summers    MRN: [id number] DOB: 26-Oct-1940  12/31/2019  [name] was observed post Covid-19 immunization for 15 minutes without incident. He was provided with Vaccine Information Sheet and instruction to access the V-Safe system.   [name] was instructed to call 911 with any severe reactions post vaccine: Marland Kitchen Difficulty breathing  . Swelling of face and throat  . A fast heartbeat  . A bad rash all over body  . Dizziness and weakness   Immunizations Administered    Name Date Dose VIS Date Route   Pfizer COVID-19 Vaccine 12/31/2019 11:43 AM 0.3 mL 09/11/2019 Intramuscular   Manufacturer: [place]   Lot: [id number]   [place]: [id number]

## 2020-01-27 ENCOUNTER — Ambulatory Visit: Payer: Medicare Other | Attending: Internal Medicine

## 2020-01-27 DIAGNOSIS — Z23 Encounter for immunization: Secondary | ICD-10-CM

## 2020-01-27 NOTE — Progress Notes (Signed)
   Covid-19 Vaccination Clinic  Name:  Richard Summers    MRN: [id number] DOB: Apr 19, 1941  01/27/2020  [name] was observed post Covid-19 immunization for 15 minutes without incident. He was provided with Vaccine Information Sheet and instruction to access the V-Safe system.   [name] was instructed to call 911 with any severe reactions post vaccine: Marland Kitchen Difficulty breathing  . Swelling of face and throat  . A fast heartbeat  . A bad rash all over body  . Dizziness and weakness   Immunizations Administered    Name Date Dose VIS Date Route   Pfizer COVID-19 Vaccine 01/27/2020 12:03 PM 0.3 mL 11/25/2018 Intramuscular   Manufacturer: [place]   Lot: [id number]   [place]: [id number]

## 2020-02-08 ENCOUNTER — Encounter: Payer: Self-pay | Admitting: Dermatology

## 2020-02-08 ENCOUNTER — Other Ambulatory Visit: Payer: Self-pay

## 2020-02-08 ENCOUNTER — Ambulatory Visit (INDEPENDENT_AMBULATORY_CARE_PROVIDER_SITE_OTHER): Payer: Medicare Other | Admitting: Dermatology

## 2020-02-08 DIAGNOSIS — D692 Other nonthrombocytopenic purpura: Secondary | ICD-10-CM

## 2020-02-08 DIAGNOSIS — L304 Erythema intertrigo: Secondary | ICD-10-CM

## 2020-02-08 DIAGNOSIS — L570 Actinic keratosis: Secondary | ICD-10-CM

## 2020-02-08 DIAGNOSIS — L578 Other skin changes due to chronic exposure to nonionizing radiation: Secondary | ICD-10-CM | POA: Diagnosis not present

## 2020-02-08 MED ORDER — FLUCONAZOLE 200 MG PO TABS: 200.0000 mg | ORAL_TABLET | ORAL | 0 refills | Status: DC

## 2020-02-08 NOTE — Patient Instructions (Signed)

## 2020-02-08 NOTE — Progress Notes (Signed)
   Follow-Up Visit   Subjective  Richard Summers is a 79 y.o. male who presents for the following: Follow-up (AK folow up of scalp treated with LN2 x 12  11/2019) and Other (Jock itch - still has some after being treated by PCP).    The following portions of the chart were reviewed this encounter and updated as appropriate:  Tobacco  Allergies  Meds  Problems  Med Hx  Surg Hx  Fam Hx      Review of Systems:  No other skin or systemic complaints except as noted in HPI or Assessment and Plan.  Objective  Well appearing patient in no apparent distress; mood and affect are within normal limits.  A focused examination was performed including scalp. Relevant physical exam findings are noted in the Assessment and Plan.  Objective  Scalp x 15, face x 1 (16): Erythematous thin papules/macules with gritty scale.   Objective  groin: Erythema of groin crease   Assessment & Plan    Actinic Damage - diffuse scaly erythematous macules with underlying dyspigmentation - Recommend daily broad spectrum sunscreen SPF 30+ to sun-exposed areas, reapply every 2 hours as needed.  - Call for new or changing lesions.  Purpura - Violaceous macules and patches - Benign - Related to age, sun damage and/or use of blood thinners - Observe - Can use OTC arnica containing moisturizer such as Dermend Bruise Formula if desired - Call for worsening or other concerns   AK (actinic keratosis) (16) Scalp x 15, face x 1  Destruction of lesion - Scalp x 15, face x 1 Complexity: simple   Destruction method: cryotherapy   Informed consent: discussed and consent obtained   Timeout:  patient name, date of birth, surgical site, and procedure verified Lesion destroyed using liquid nitrogen: Yes   Region frozen until ice ball extended beyond lesion: Yes   Outcome: patient tolerated procedure well with no complications   Post-procedure details: wound care instructions given    Erythema  intertrigo groin  Intertrigo with Candida -   Start Iodoquinol1%/Hydrocortisone 2.5%/Niacinamide 2% cream qd. 30g 1RF sent to [facility].  fluconazole (DIFLUCAN) 200 MG tablet - groin  Return in about 3 months (around 05/10/2020).  I, Ashok Cordia, CMA, am acting as scribe for Sarina Ser, MD .  Documentation: I have reviewed the above documentation for accuracy and completeness, and I agree with the above.  Sarina Ser, MD

## 2020-02-09 ENCOUNTER — Encounter: Payer: Self-pay | Admitting: Dermatology

## 2020-03-03 ENCOUNTER — Encounter: Payer: Self-pay | Admitting: Dermatology

## 2020-03-23 ENCOUNTER — Other Ambulatory Visit: Payer: Self-pay

## 2020-03-23 DIAGNOSIS — D472 Monoclonal gammopathy: Secondary | ICD-10-CM

## 2020-03-23 DIAGNOSIS — R768 Other specified abnormal immunological findings in serum: Secondary | ICD-10-CM

## 2020-03-25 ENCOUNTER — Inpatient Hospital Stay: Payer: Medicare Other | Attending: Oncology

## 2020-03-25 ENCOUNTER — Other Ambulatory Visit: Payer: Self-pay

## 2020-03-25 DIAGNOSIS — Z79899 Other long term (current) drug therapy: Secondary | ICD-10-CM | POA: Insufficient documentation

## 2020-03-25 DIAGNOSIS — Z87891 Personal history of nicotine dependence: Secondary | ICD-10-CM | POA: Diagnosis not present

## 2020-03-25 DIAGNOSIS — R768 Other specified abnormal immunological findings in serum: Secondary | ICD-10-CM

## 2020-03-25 DIAGNOSIS — D472 Monoclonal gammopathy: Secondary | ICD-10-CM | POA: Insufficient documentation

## 2020-03-25 DIAGNOSIS — Z7982 Long term (current) use of aspirin: Secondary | ICD-10-CM | POA: Insufficient documentation

## 2020-03-25 DIAGNOSIS — I10 Essential (primary) hypertension: Secondary | ICD-10-CM | POA: Insufficient documentation

## 2020-03-25 LAB — COMPREHENSIVE METABOLIC PANEL
ALT: 18 U/L (ref 0–44)
AST: 17 U/L (ref 15–41)
Albumin: 4.4 g/dL (ref 3.5–5.0)
Alkaline Phosphatase: 70 U/L (ref 38–126)
Anion gap: 9 (ref 5–15)
BUN: 31 mg/dL — ABNORMAL HIGH (ref 8–23)
CO2: 29 mmol/L (ref 22–32)
Calcium: 9.2 mg/dL (ref 8.9–10.3)
Chloride: 104 mmol/L (ref 98–111)
Creatinine, Ser: 0.78 mg/dL (ref 0.61–1.24)
GFR calc Af Amer: >60 mL/min (ref >60)
GFR calc non Af Amer: >60 mL/min (ref >60)
Glucose, Bld: 115 mg/dL — ABNORMAL HIGH (ref 70–99)
Potassium: 3.9 mmol/L (ref 3.5–5.1)
Sodium: 142 mmol/L (ref 135–145)
Total Bilirubin: 1.4 mg/dL — ABNORMAL HIGH (ref 0.3–1.2)
Total Protein: 7.5 g/dL (ref 6.5–8.1)

## 2020-03-25 LAB — CBC WITH DIFFERENTIAL/PLATELET
Abs Immature Granulocytes: 0.03 10*3/uL (ref 0.00–0.07)
Basophils Absolute: 0 10*3/uL (ref 0.0–0.1)
Basophils Relative: 1 %
Eosinophils Absolute: 0.2 10*3/uL (ref 0.0–0.5)
Eosinophils Relative: 3 %
HCT: 43.9 % (ref 39.0–52.0)
Hemoglobin: 15.4 g/dL (ref 13.0–17.0)
Immature Granulocytes: 1 %
Lymphocytes Relative: 25 %
Lymphs Abs: 1.4 10*3/uL (ref 0.7–4.0)
MCH: 32.8 pg (ref 26.0–34.0)
MCHC: 35.1 g/dL (ref 30.0–36.0)
MCV: 93.6 fL (ref 80.0–100.0)
Monocytes Absolute: 0.6 10*3/uL (ref 0.1–1.0)
Monocytes Relative: 10 %
Neutro Abs: 3.5 10*3/uL (ref 1.7–7.7)
Neutrophils Relative %: 60 %
Platelets: 139 10*3/uL — ABNORMAL LOW (ref 150–400)
RBC: 4.69 MIL/uL (ref 4.22–5.81)
RDW: 12.2 % (ref 11.5–15.5)
WBC: 5.7 10*3/uL (ref 4.0–10.5)
nRBC: 0 % (ref 0.0–0.2)

## 2020-03-28 LAB — MULTIPLE MYELOMA PANEL, SERUM
Albumin SerPl Elph-Mcnc: 3.7 g/dL (ref 2.9–4.4)
Albumin/Glob SerPl: 1.2 (ref 0.7–1.7)
Alpha 1: 0.2 g/dL (ref 0.0–0.4)
Alpha2 Glob SerPl Elph-Mcnc: 0.7 g/dL (ref 0.4–1.0)
B-Globulin SerPl Elph-Mcnc: 0.9 g/dL (ref 0.7–1.3)
Gamma Glob SerPl Elph-Mcnc: 1.5 g/dL (ref 0.4–1.8)
Globulin, Total: 3.3 g/dL (ref 2.2–3.9)
IgA: 47 mg/dL — ABNORMAL LOW (ref 61–437)
IgG (Immunoglobin G), Serum: 565 mg/dL — ABNORMAL LOW (ref 603–1613)
IgM (Immunoglobulin M), Srm: 717 mg/dL — ABNORMAL HIGH (ref 15–143)
M Protein SerPl Elph-Mcnc: 0.7 g/dL — ABNORMAL HIGH
Total Protein ELP: 7 g/dL (ref 6.0–8.5)

## 2020-03-28 LAB — KAPPA/LAMBDA LIGHT CHAINS
Kappa free light chain: 67 mg/L — ABNORMAL HIGH (ref 3.3–19.4)
Kappa, lambda light chain ratio: 7.28 — ABNORMAL HIGH (ref 0.26–1.65)
Lambda free light chains: 9.2 mg/L (ref 5.7–26.3)

## 2020-05-19 ENCOUNTER — Ambulatory Visit: Payer: Medicare Other | Admitting: Dermatology

## 2020-05-25 ENCOUNTER — Ambulatory Visit (INDEPENDENT_AMBULATORY_CARE_PROVIDER_SITE_OTHER): Payer: Medicare Other | Admitting: Dermatology

## 2020-05-25 ENCOUNTER — Other Ambulatory Visit: Payer: Self-pay

## 2020-05-25 DIAGNOSIS — L820 Inflamed seborrheic keratosis: Secondary | ICD-10-CM | POA: Diagnosis not present

## 2020-05-25 DIAGNOSIS — L304 Erythema intertrigo: Secondary | ICD-10-CM

## 2020-05-25 DIAGNOSIS — L578 Other skin changes due to chronic exposure to nonionizing radiation: Secondary | ICD-10-CM | POA: Diagnosis not present

## 2020-05-25 DIAGNOSIS — L821 Other seborrheic keratosis: Secondary | ICD-10-CM

## 2020-05-25 DIAGNOSIS — L570 Actinic keratosis: Secondary | ICD-10-CM

## 2020-05-25 NOTE — Progress Notes (Signed)
   Follow-Up Visit   Subjective  Richard Summers is a 79 y.o. male who presents for the following: Actinic Keratosis (of the face and scalp - check for any persistent new or changing lesions).  The following portions of the chart were reviewed this encounter and updated as appropriate:  Tobacco  Allergies  Meds  Problems  Med Hx  Surg Hx  Fam Hx     Review of Systems:  No other skin or systemic complaints except as noted in HPI or Assessment and Plan.  Objective  Well appearing patient in no apparent distress; mood and affect are within normal limits.  A focused examination was performed including the face and scalp. Relevant physical exam findings are noted in the Assessment and Plan.  Objective  Scalp x 5 (5): Erythematous thin papules/macules with gritty scale.   Objective  Scalp x 2, R cheek x 1 (3): Erythematous keratotic or waxy stuck-on papule or plaque.   Objective  Groin: Mild pinkness  Assessment & Plan  AK (actinic keratosis) (5) Scalp x 5  Destruction of lesion - Scalp x 5 Complexity: simple   Destruction method: cryotherapy   Informed consent: discussed and consent obtained   Timeout:  patient name, date of birth, surgical site, and procedure verified Lesion destroyed using liquid nitrogen: Yes   Region frozen until ice ball extended beyond lesion: Yes   Outcome: patient tolerated procedure well with no complications   Post-procedure details: wound care instructions given    Inflamed seborrheic keratosis (3) Scalp x 2, R cheek x 1  Destruction of lesion - Scalp x 2, R cheek x 1 Complexity: simple   Destruction method: cryotherapy   Informed consent: discussed and consent obtained   Timeout:  patient name, date of birth, surgical site, and procedure verified Lesion destroyed using liquid nitrogen: Yes   Region frozen until ice ball extended beyond lesion: Yes   Outcome: patient tolerated procedure well with no complications   Post-procedure  details: wound care instructions given    Erythema intertrigo Groin  Continue [facility] mix QD-BID PRN flares  Other Related Medications fluconazole (DIFLUCAN) 200 MG tablet   Actinic Damage - diffuse scaly erythematous macules with underlying dyspigmentation - Recommend daily broad spectrum sunscreen SPF 30+ to sun-exposed areas, reapply every 2 hours as needed.  - Call for new or changing lesions.  Seborrheic Keratoses - Stuck-on, waxy, tan-brown papules and plaques  - Discussed benign etiology and prognosis. - Observe - Call for any changes  Return in about 6 months (around 11/25/2020).  [name], CMA, am acting as scribe for Sarina Ser, MD .  Documentation: I have reviewed the above documentation for accuracy and completeness, and I agree with the above.  Sarina Ser, MD

## 2020-06-01 ENCOUNTER — Encounter: Payer: Self-pay | Admitting: Dermatology

## 2020-06-27 ENCOUNTER — Inpatient Hospital Stay (HOSPITAL_BASED_OUTPATIENT_CLINIC_OR_DEPARTMENT_OTHER): Payer: Medicare Other | Admitting: Oncology

## 2020-06-27 ENCOUNTER — Encounter: Payer: Self-pay | Admitting: Oncology

## 2020-06-27 ENCOUNTER — Other Ambulatory Visit: Payer: Self-pay

## 2020-06-27 ENCOUNTER — Other Ambulatory Visit: Payer: Self-pay | Admitting: Oncology

## 2020-06-27 ENCOUNTER — Inpatient Hospital Stay: Payer: Medicare Other | Attending: Oncology

## 2020-06-27 VITALS — BP 162/84 | HR 68 | Temp 97.9°F | Resp 16 | Wt 162.8 lb

## 2020-06-27 DIAGNOSIS — D472 Monoclonal gammopathy: Secondary | ICD-10-CM | POA: Diagnosis present

## 2020-06-27 DIAGNOSIS — R768 Other specified abnormal immunological findings in serum: Secondary | ICD-10-CM

## 2020-06-27 DIAGNOSIS — Z7982 Long term (current) use of aspirin: Secondary | ICD-10-CM | POA: Diagnosis not present

## 2020-06-27 DIAGNOSIS — Z79899 Other long term (current) drug therapy: Secondary | ICD-10-CM | POA: Diagnosis not present

## 2020-06-27 DIAGNOSIS — E785 Hyperlipidemia, unspecified: Secondary | ICD-10-CM | POA: Diagnosis not present

## 2020-06-27 DIAGNOSIS — Z951 Presence of aortocoronary bypass graft: Secondary | ICD-10-CM | POA: Diagnosis not present

## 2020-06-27 DIAGNOSIS — Z87891 Personal history of nicotine dependence: Secondary | ICD-10-CM | POA: Diagnosis not present

## 2020-06-27 DIAGNOSIS — D696 Thrombocytopenia, unspecified: Secondary | ICD-10-CM | POA: Diagnosis not present

## 2020-06-27 LAB — CBC WITH DIFFERENTIAL/PLATELET
Abs Immature Granulocytes: 0.03 10*3/uL (ref 0.00–0.07)
Basophils Absolute: 0.1 10*3/uL (ref 0.0–0.1)
Basophils Relative: 1 %
Eosinophils Absolute: 0.3 10*3/uL (ref 0.0–0.5)
Eosinophils Relative: 4 %
HCT: 42.3 % (ref 39.0–52.0)
Hemoglobin: 15.3 g/dL (ref 13.0–17.0)
Immature Granulocytes: 0 %
Lymphocytes Relative: 22 %
Lymphs Abs: 1.5 10*3/uL (ref 0.7–4.0)
MCH: 33.7 pg (ref 26.0–34.0)
MCHC: 36.2 g/dL — ABNORMAL HIGH (ref 30.0–36.0)
MCV: 93.2 fL (ref 80.0–100.0)
Monocytes Absolute: 0.7 10*3/uL (ref 0.1–1.0)
Monocytes Relative: 10 %
Neutro Abs: 4.2 10*3/uL (ref 1.7–7.7)
Neutrophils Relative %: 63 %
Platelets: 137 10*3/uL — ABNORMAL LOW (ref 150–400)
RBC: 4.54 MIL/uL (ref 4.22–5.81)
RDW: 12.2 % (ref 11.5–15.5)
WBC: 6.7 10*3/uL (ref 4.0–10.5)
nRBC: 0 % (ref 0.0–0.2)

## 2020-06-27 LAB — COMPREHENSIVE METABOLIC PANEL
ALT: 18 U/L (ref 0–44)
AST: 18 U/L (ref 15–41)
Albumin: 4.3 g/dL (ref 3.5–5.0)
Alkaline Phosphatase: 59 U/L (ref 38–126)
Anion gap: 9 (ref 5–15)
BUN: 29 mg/dL — ABNORMAL HIGH (ref 8–23)
CO2: 25 mmol/L (ref 22–32)
Calcium: 8.9 mg/dL (ref 8.9–10.3)
Chloride: 107 mmol/L (ref 98–111)
Creatinine, Ser: 0.95 mg/dL (ref 0.61–1.24)
GFR calc Af Amer: >60 mL/min (ref >60)
GFR calc non Af Amer: >60 mL/min (ref >60)
Glucose, Bld: 111 mg/dL — ABNORMAL HIGH (ref 70–99)
Potassium: 3.7 mmol/L (ref 3.5–5.1)
Sodium: 141 mmol/L (ref 135–145)
Total Bilirubin: 1.4 mg/dL — ABNORMAL HIGH (ref 0.3–1.2)
Total Protein: 7.2 g/dL (ref 6.5–8.1)

## 2020-06-27 NOTE — Progress Notes (Signed)
mgus

## 2020-06-27 NOTE — Progress Notes (Signed)
Pt doing great, no longer has neuropathy. Eating and drinking good, no concerns with bowels. 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 No concerns today and will wait for his labs for MGUS to determine what will be plan for him in the future

## 2020-06-28 LAB — MULTIPLE MYELOMA PANEL, SERUM
Albumin SerPl Elph-Mcnc: 3.9 g/dL (ref 2.9–4.4)
Albumin/Glob SerPl: 1.4 (ref 0.7–1.7)
Alpha 1: 0.2 g/dL (ref 0.0–0.4)
Alpha2 Glob SerPl Elph-Mcnc: 0.7 g/dL (ref 0.4–1.0)
B-Globulin SerPl Elph-Mcnc: 0.9 g/dL (ref 0.7–1.3)
Gamma Glob SerPl Elph-Mcnc: 1.2 g/dL (ref 0.4–1.8)
Globulin, Total: 3 g/dL (ref 2.2–3.9)
IgA: 57 mg/dL — ABNORMAL LOW (ref 61–437)
IgG (Immunoglobin G), Serum: 592 mg/dL — ABNORMAL LOW (ref 603–1613)
IgM (Immunoglobulin M), Srm: 828 mg/dL — ABNORMAL HIGH (ref 15–143)
M Protein SerPl Elph-Mcnc: 0.7 g/dL — ABNORMAL HIGH
Total Protein ELP: 6.9 g/dL (ref 6.0–8.5)

## 2020-06-28 LAB — KAPPA/LAMBDA LIGHT CHAINS
Kappa free light chain: 76.2 mg/L — ABNORMAL HIGH (ref 3.3–19.4)
Kappa, lambda light chain ratio: 9.65 — ABNORMAL HIGH (ref 0.26–1.65)
Lambda free light chains: 7.9 mg/L (ref 5.7–26.3)

## 2020-06-29 ENCOUNTER — Telehealth: Payer: Self-pay | Admitting: *Deleted

## 2020-06-29 NOTE — Progress Notes (Signed)
Hematology/Oncology Consult note [facility]  Telephone:([phone] [phone]  Patient Care Team: Harrel Lemon, MD as PCP - General (Internal Medicine) Sindy Guadeloupe, MD as Consulting Physician (Oncology)   Name of the patient: Richard Summers  [id number]  1941-05-03   Date of visit: 06/29/20  Diagnosis- IGM MGUS  Chief complaint/ Reason for visit-routine follow-up of IgM MGUS  Heme/Onc history: Patient is a 79 year old male with a past medical history significant for hyperlipidemia who has been having right lower extremity weakness and was recently seen by neurology. Patient states that he was very active up until a year ago when he used to exercise in an apartment bike for 3 hours but presently is barely able to do it for 10 minutes. He still ambulates independently but after he stands up for a while he feels as if his legs are giving way. He did undergo nerve conduction studies with Dr. Manuella Ghazi as well. As a work-up of his right lower extremity weakness patient had an SPEP checked which showed a monoclonal protein of 0.6 g IgM kappa. Of note patient has a normal CBC and a CMP in April 2020. His calcium levels in the past have also been normal.  Results of blood work from August 2020 showed normal CBC with a mild thrombocytopenia of 146.  Serum creatinine and calcium were normal.  Serum free light chain was elevated at 6.1.  Interval history-patient reports that his neuropathy symptoms have resolved and he is feeling better.  Appetite is good although he has lost 7 pounds in the last 6 months.  Denies any new aches and pains anywhere.  ECOG PS- 1 Pain scale- 0   Review of systems- Review of Systems  Constitutional: Positive for malaise/fatigue. Negative for chills, fever and weight loss.  HENT: Negative for congestion, ear discharge and nosebleeds.   Eyes: Negative for blurred vision.  Respiratory: Negative for cough, hemoptysis, sputum  production, shortness of breath and wheezing.   Cardiovascular: Negative for chest pain, palpitations, orthopnea and claudication.  Gastrointestinal: Negative for abdominal pain, blood in stool, constipation, diarrhea, heartburn, melena, nausea and vomiting.  Genitourinary: Negative for dysuria, flank pain, frequency, hematuria and urgency.  Musculoskeletal: Negative for back pain, joint pain and myalgias.  Skin: Negative for rash.  Neurological: Negative for dizziness, tingling, focal weakness, seizures, weakness and headaches.  Endo/Heme/Allergies: Does not bruise/bleed easily.  Psychiatric/Behavioral: Negative for depression and suicidal ideas. The patient does not have insomnia.       Allergies  Allergen Reactions  . Penicillins Anaphylaxis, Hives and Other (See Comments)  . Sulfa Antibiotics Anaphylaxis, Hives and Other (See Comments)  . Ezetimibe Other (See Comments)  . Rosuvastatin Other (See Comments)  . Statins     Other reaction(s): Other (See Comments) Confusion and mania      Past Medical History:  Diagnosis Date  . Allergic rhinitis   . Allergy   . Barrett's esophagus   . Cataract cortical, senile   . 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 Epilepsy ([place])   . GERD (gastroesophageal reflux disease)   . Glaucoma   . Hyperlipidemia   . Hypertension   . Insomnia   . MGUS (monoclonal gammopathy of unknown significance)   . Myocardial infarction ([place])   . Nephrolithiasis   . Osteoporosis   . S/P triple vessel bypass   . Sleep apnea   . 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 ANKLE SURGERY    . broken leg    . CARDIAC SURGERY    . CORONARY ARTERY BYPASS GRAFT     triple  . FRACTURE SURGERY     Fibula and Tibia. Plates and screws installed  . TONSILLECTOMY    . triple bypass    . VASECTOMY      Social History   Socioeconomic History    . Marital status: Married    Spouse name: Not on file  . Number of children: Not on file  . Years of education: Not on file  . Highest education level: Not on file  Occupational History  . Not on file  Tobacco Use  . Smoking status: Former Smoker    Types: 48, Pipe    Quit date: 10/29/2007    Years since quitting: 12.6  . Smokeless tobacco: Never Used  Vaping Use  . Vaping Use: Never used  Substance and Sexual Activity  . Alcohol use: Yes    Alcohol/week: 7.0 standard drinks    Types: 7 Shots of liquor per week    Comment: vodka in evening  . Drug use: Never  . Sexual activity: Not Currently  Other Topics Concern  . Not on file  Social History Narrative  . Not on file   Social Determinants of Health   Financial Resource Strain:   . Difficulty of Paying Living Expenses: Not on file  Food Insecurity:   . Worried About Charity fundraiser in the Last Year: Not on file  . Ran Out of Food in the Last Year: Not on file  Transportation Needs:   . Lack of Transportation (Medical): Not on file  . Lack of Transportation (Non-Medical): Not on file  Physical Activity:   . Days of Exercise per Week: Not on file  . Minutes of Exercise per Session: Not on file  Stress:   . Feeling of Stress : Not on file  Social Connections:   . Frequency of Communication with Friends and Family: Not on file  . Frequency of Social Gatherings with Friends and Family: Not on file  . Attends Religious Services: Not on file  . Active Member of Clubs or Organizations: Not on file  . Attends Archivist Meetings: Not on file  . Marital Status: Not on file  Intimate Partner Violence:   . Fear of Current or Ex-Partner: Not on file  . Emotionally Abused: Not on file  . Physically Abused: Not on file  . Sexually Abused: Not on file    Family History  Problem Relation Age of Onset  . Seizures Mother   . Kidney cancer Father      Current Outpatient Medications:  .  aspirin EC 81 MG  tablet, Take 81 mg by mouth daily., Disp: , Rfl:  .  cholestyramine (QUESTRAN) 4 GM/DOSE powder, Take 27 grams by mouth twice a day before meals. Do not take more than 54 grams per 24 hours., Disp: , Rfl:  .  hydrochlorothiazide (HYDRODIURIL) 25 MG tablet, Take 25 mg by mouth daily. , Disp: , Rfl:  .  latanoprost (XALATAN) 0.005 % ophthalmic solution, Place 1 drop into both eyes at bedtime. , Disp: , Rfl:  .  levETIRAcetam (KEPPRA XR) 500 MG 24 hr tablet, Take 2,000 mg by mouth daily. , Disp: , Rfl:  .  Loratadine 10 MG CAPS, Take 10 mg by mouth daily. , Disp: ,  Rfl:  .  losartan (COZAAR) 100 MG tablet, Take 100 mg by mouth daily. , Disp: , Rfl:  .  montelukast (SINGULAIR) 10 MG tablet, Take 10 mg by mouth daily. , Disp: , Rfl:  .  niacin (NIASPAN) 500 MG CR tablet, Take 500 mg by mouth in the morning and at bedtime. , Disp: , Rfl:  .  pantoprazole (PROTONIX) 40 MG tablet, Take 40 mg by mouth daily. , Disp: , Rfl:  .  azelastine (ASTELIN) 0.1 % nasal spray, Place 1 spray into the nose daily. , Disp: , Rfl:  .  clindamycin (CLEOCIN) 150 MG capsule, as needed For dental PRN (Patient not taking: Reported on 06/27/2020), Disp: , Rfl:  .  losartan (COZAAR) 50 MG tablet, Take 50 mg by mouth 2 (two) times daily., Disp: , Rfl:   Physical exam:  Vitals:   06/27/20 1152  BP: (!) 162/84  Pulse: 68  Resp: 16  Temp: 97.9 F (36.6 C)  TempSrc: Oral  Weight: 162 lb 12.8 oz (73.8 kg)   Physical Exam Constitutional:      General: He is not in acute distress.    Comments: He is ambulating without a cane today  Cardiovascular:     Rate and Rhythm: Normal rate and regular rhythm.     Heart sounds: Normal heart sounds.  Pulmonary:     Effort: Pulmonary effort is normal.     Breath sounds: Normal breath sounds.  Abdominal:     General: Bowel sounds are normal.     Palpations: Abdomen is soft.  Skin:    General: Skin is warm and dry.  Neurological:     Mental Status: He is alert and oriented to  person, place, and time.      CMP Latest Ref Rng & Units 06/27/2020  Glucose 70 - 99 mg/dL 111(H)  BUN 8 - 23 mg/dL 29(H)  Creatinine 0.61 - 1.24 mg/dL 0.95  Sodium 135 - 145 mmol/L 141  Potassium 3.5 - 5.1 mmol/L 3.7  Chloride 98 - 111 mmol/L 107  CO2 22 - 32 mmol/L 25  Calcium 8.9 - 10.3 mg/dL 8.9  Total Protein 6.5 - 8.1 g/dL 7.2  Total Bilirubin 0.3 - 1.2 mg/dL 1.4(H)  Alkaline Phos 38 - 126 U/L 59  AST 15 - 41 U/L 18  ALT 0 - 44 U/L 18   CBC Latest Ref Rng & Units 06/27/2020  WBC 4.0 - 10.5 K/uL 6.7  Hemoglobin 13.0 - 17.0 g/dL 15.3  Hematocrit 39 - 52 % 42.3  Platelets 150 - 400 K/uL 137(L)    Assessment and plan- Patient is a 79 y.o. male with IgM MGUS here for routine follow-up  Based on his present labs patient is not anemic and he has a mild thrombocytopenia with a platelet count of 137.  CMP is otherwise unremarkable except for mildly chronically elevated total bilirubin which has remained stable overall.  Serum calcium and kidney numbers are normal.  Myeloma panel shows elevated IgM level which has fluctuated between 700-800.  M protein remained stable around 0.7.  However his serum free light chain ratio is now elevated to 9.6 and over the last 6 months it has gone up from 5.3-7 0.2-9.6 presently.  Given that the ratio is greater than 7 I would like to get a bone marrow biopsy to rule out overt IgM myeloma.  Usually IgM is suggestive of [employer].  Patient is agreeable to a bone marrow biopsy and I will see him following  that   Visit Diagnosis 1. Elevated serum immunoglobulin free light chain level   2. MGUS (monoclonal gammopathy of unknown significance)      Dr. Randa Evens, MD, MPH [facility] at [facility] [phone] 06/29/2020 3:57 PM

## 2020-06-29 NOTE — Telephone Encounter (Signed)
Called patient to let him know that his light chains 6 months ago were 5, 3 months ago was 7, and now it is 9.  Due to that increased Dr. Rao would prefer if he would get a bone marrow biopsy to determine whether or not there is any kind of cancer such as multiple myeloma.  Patient was not home and this information was left on his voicemail and I asked him to give us a call back at [phone].  

## 2020-07-14 ENCOUNTER — Encounter: Payer: Self-pay | Admitting: Oncology

## 2020-07-14 ENCOUNTER — Other Ambulatory Visit: Payer: Self-pay | Admitting: *Deleted

## 2020-07-14 DIAGNOSIS — R768 Other specified abnormal immunological findings in serum: Secondary | ICD-10-CM

## 2020-07-14 DIAGNOSIS — D472 Monoclonal gammopathy: Secondary | ICD-10-CM

## 2020-07-20 ENCOUNTER — Telehealth: Payer: Self-pay | Admitting: *Deleted

## 2020-07-20 NOTE — Telephone Encounter (Signed)
Patient called back and told us that he has everything worked out and he can make the 1025 appointment for a bone marrow biopsy.  Patient has all instructions for the procedure

## 2020-07-22 ENCOUNTER — Other Ambulatory Visit: Payer: Self-pay | Admitting: Student

## 2020-07-22 NOTE — Progress Notes (Signed)
Patient on schedule for BMB 07/25/2020. Spoke with patient on phone and made aware to be Here @ 0800, NPO after MN, and driver for discharge after procedure/recovery. Stated understanding.

## 2020-07-25 ENCOUNTER — Other Ambulatory Visit: Payer: Self-pay

## 2020-07-25 ENCOUNTER — Ambulatory Visit
Admission: RE | Admit: 2020-07-25 | Discharge: 2020-07-25 | Disposition: A | Discharge status: Home / Self Care | Payer: Medicare Other | Source: Ambulatory Visit | Attending: Oncology | Admitting: Oncology

## 2020-07-25 DIAGNOSIS — E785 Hyperlipidemia, unspecified: Secondary | ICD-10-CM | POA: Diagnosis not present

## 2020-07-25 DIAGNOSIS — Z87891 Personal history of nicotine dependence: Secondary | ICD-10-CM | POA: Insufficient documentation

## 2020-07-25 DIAGNOSIS — R768 Other specified abnormal immunological findings in serum: Secondary | ICD-10-CM

## 2020-07-25 DIAGNOSIS — Z8051 Family history of malignant neoplasm of kidney: Secondary | ICD-10-CM | POA: Insufficient documentation

## 2020-07-25 DIAGNOSIS — I10 Essential (primary) hypertension: Secondary | ICD-10-CM | POA: Insufficient documentation

## 2020-07-25 DIAGNOSIS — G40909 Epilepsy, unspecified, not intractable, without status epilepticus: Secondary | ICD-10-CM | POA: Insufficient documentation

## 2020-07-25 DIAGNOSIS — Z7982 Long term (current) use of aspirin: Secondary | ICD-10-CM | POA: Insufficient documentation

## 2020-07-25 DIAGNOSIS — C9000 Multiple myeloma not having achieved remission: Secondary | ICD-10-CM | POA: Diagnosis present

## 2020-07-25 DIAGNOSIS — I252 Old myocardial infarction: Secondary | ICD-10-CM | POA: Insufficient documentation

## 2020-07-25 DIAGNOSIS — Z85828 Personal history of other malignant neoplasm of skin: Secondary | ICD-10-CM | POA: Diagnosis not present

## 2020-07-25 DIAGNOSIS — Z951 Presence of aortocoronary bypass graft: Secondary | ICD-10-CM | POA: Insufficient documentation

## 2020-07-25 DIAGNOSIS — Z79899 Other long term (current) drug therapy: Secondary | ICD-10-CM | POA: Diagnosis not present

## 2020-07-25 DIAGNOSIS — D696 Thrombocytopenia, unspecified: Secondary | ICD-10-CM | POA: Insufficient documentation

## 2020-07-25 DIAGNOSIS — K219 Gastro-esophageal reflux disease without esophagitis: Secondary | ICD-10-CM | POA: Diagnosis not present

## 2020-07-25 DIAGNOSIS — D472 Monoclonal gammopathy: Secondary | ICD-10-CM | POA: Diagnosis not present

## 2020-07-25 DIAGNOSIS — K2270 Barrett's esophagus without dysplasia: Secondary | ICD-10-CM | POA: Diagnosis not present

## 2020-07-25 DIAGNOSIS — C9030 Solitary plasmacytoma not having achieved remission: Secondary | ICD-10-CM | POA: Insufficient documentation

## 2020-07-25 LAB — CBC WITH DIFFERENTIAL/PLATELET
Abs Immature Granulocytes: 0.02 10*3/uL (ref 0.00–0.07)
Basophils Absolute: 0 10*3/uL (ref 0.0–0.1)
Basophils Relative: 1 %
Eosinophils Absolute: 0.2 10*3/uL (ref 0.0–0.5)
Eosinophils Relative: 5 %
HCT: 41.4 % (ref 39.0–52.0)
Hemoglobin: 14.4 g/dL (ref 13.0–17.0)
Immature Granulocytes: 0 %
Lymphocytes Relative: 24 %
Lymphs Abs: 1.1 10*3/uL (ref 0.7–4.0)
MCH: 32.9 pg (ref 26.0–34.0)
MCHC: 34.8 g/dL (ref 30.0–36.0)
MCV: 94.5 fL (ref 80.0–100.0)
Monocytes Absolute: 0.6 10*3/uL (ref 0.1–1.0)
Monocytes Relative: 13 %
Neutro Abs: 2.7 10*3/uL (ref 1.7–7.7)
Neutrophils Relative %: 57 %
Platelets: 133 10*3/uL — ABNORMAL LOW (ref 150–400)
RBC: 4.38 MIL/uL (ref 4.22–5.81)
RDW: 12.3 % (ref 11.5–15.5)
WBC: 4.8 10*3/uL (ref 4.0–10.5)
nRBC: 0 % (ref 0.0–0.2)

## 2020-07-25 MED ORDER — SODIUM CHLORIDE 0.9 % IV SOLN: INTRAVENOUS | Status: DC

## 2020-07-25 MED ORDER — HEPARIN SOD (PORK) LOCK FLUSH 100 UNIT/ML IV SOLN
INTRAVENOUS | Status: AC
  Filled 2020-07-25: qty 5

## 2020-07-25 MED ORDER — FENTANYL CITRATE (PF) 100 MCG/2ML IJ SOLN
INTRAMUSCULAR | Status: AC
  Filled 2020-07-25: qty 2

## 2020-07-25 MED ORDER — FENTANYL CITRATE (PF) 100 MCG/2ML IJ SOLN
INTRAMUSCULAR | Status: AC | PRN
Start: 2020-07-25 — End: 2020-07-25
  Administered 2020-07-25: 25 ug via INTRAVENOUS
  Administered 2020-07-25: 50 ug via INTRAVENOUS

## 2020-07-25 MED ORDER — MIDAZOLAM HCL 5 MG/5ML IJ SOLN
INTRAMUSCULAR | Status: AC | PRN
  Administered 2020-07-25 (×2): 1 mg via INTRAVENOUS

## 2020-07-25 MED ORDER — MIDAZOLAM HCL 5 MG/5ML IJ SOLN
INTRAMUSCULAR | Status: AC
  Filled 2020-07-25: qty 5

## 2020-07-25 NOTE — Discharge Instructions (Signed)
Bone Marrow Aspiration and Bone Marrow Biopsy, Adult, Care After This sheet gives you information about how to care for yourself after your procedure. Your health care provider may also give you more specific instructions. If you have problems or questions, contact your health care provider. What can I expect after the procedure? After the procedure, it is common to have:  Mild pain and tenderness.  Swelling.  Bruising. Follow these instructions at home: Puncture site care   Follow instructions from your health care provider about how to take care of the puncture site. Make sure you: ? Wash your hands with soap and water before and after you change your bandage (dressing). If soap and water are not available, use hand sanitizer. ? Change your dressing as told by your health care provider.  Check your puncture site every day for signs of infection. Check for: ? More redness, swelling, or pain. ? Fluid or blood. ? Warmth. ? Pus or a bad smell. Activity  Return to your normal activities as told by your health care provider. Ask your health care provider what activities are safe for you.  Do not lift anything that is heavier than 10 lb (4.5 kg), or the limit that you are told, until your health care provider says that it is safe.  Do not drive for 24 hours if you were given a sedative during your procedure. General instructions   Take over-the-counter and prescription medicines only as told by your health care provider.  Do not take baths, swim, or use a hot tub until your health care provider approves. Ask your health care provider if you may take showers. You may only be allowed to take sponge baths.  If directed, put ice on the affected area. To do this: ? Put ice in a plastic bag. ? Place a towel between your skin and the bag. ? Leave the ice on for 20 minutes, 2-3 times a day.  Keep all follow-up visits as told by your health care provider. This is important. Contact a  health care provider if:  Your pain is not controlled with medicine.  You have a fever.  You have more redness, swelling, or pain around the puncture site.  You have fluid or blood coming from the puncture site.  Your puncture site feels warm to the touch.  You have pus or a bad smell coming from the puncture site. Summary  After the procedure, it is common to have mild pain, tenderness, swelling, and bruising.  Follow instructions from your health care provider about how to take care of the puncture site and what activities are safe for you.  Take over-the-counter and prescription medicines only as told by your health care provider.  Contact a health care provider if you have any signs of infection, such as fluid or blood coming from the puncture site. This information is not intended to replace advice given to you by your health care provider. Make sure you discuss any questions you have with your health care provider. Document Revised: 02/03/2019 Document Reviewed: 02/03/2019 Elsevier Patient Education  [place]. Moderate Conscious Sedation, Adult, Care After These instructions provide you with information about caring for yourself after your procedure. Your health care provider may also give you more specific instructions. Your treatment has been planned according to current medical practices, but problems sometimes occur. Call your health care provider if you have any problems or questions after your procedure. What can I expect after the procedure? After your procedure,  it is common:  To feel sleepy for several hours.  To feel clumsy and have poor balance for several hours.  To have poor judgment for several hours.  To vomit if you eat too soon. Follow these instructions at home: For at least 24 hours after the procedure:   Do not: ? Participate in activities where you could fall or become injured. ? Drive. ? Use heavy machinery. ? Drink alcohol. ? Take  sleeping pills or medicines that cause drowsiness. ? Make important decisions or sign legal documents. ? Take care of children on your own.  Rest. Eating and drinking  Follow the diet recommended by your health care provider.  If you vomit: ? Drink water, juice, or soup when you can drink without vomiting. ? Make sure you have little or no nausea before eating solid foods. General instructions  Have a responsible adult stay with you until you are awake and alert.  Take over-the-counter and prescription medicines only as told by your health care provider.  If you smoke, do not smoke without supervision.  Keep all follow-up visits as told by your health care provider. This is important. Contact a health care provider if:  You keep feeling nauseous or you keep vomiting.  You feel light-headed.  You develop a rash.  You have a fever. Get help right away if:  You have trouble breathing. This information is not intended to replace advice given to you by your health care provider. Make sure you discuss any questions you have with your health care provider. Document Revised: 08/30/2017 Document Reviewed: 01/07/2016 Elsevier Patient Education  2020 [employer].

## 2020-07-25 NOTE — Progress Notes (Deleted)
Pt asking to walk to the bathroom to urinate. This RN has explained to pt in depth that he has had sedating medication and per policy and his best interest of care and safety he needs to remain in bed and on the monitor. Pt advised that at 1105 am he could walk to the bathroom as he is d/c. Pt given a urinal, the bed is lowered and the rail is down should he need to urinate.

## 2020-07-25 NOTE — Progress Notes (Signed)
Pt asking to walk to the bathroom to urinate. This RN has explained to pt in depth that he has had sedating medication and per policy and his best interest of care and safety he needs to remain in bed and on the monitor. Pt advised that at 1105 am he could walk to the bathroom as he is d/c. Pt given a urinal, the bed is lowered and the rail is down should he need to urinate.

## 2020-07-25 NOTE — H&P (Signed)
Chief Complaint: Patient was seen in consultation today for an image guided bone marrow biopsy.  Referring Physician(s): Sindy Guadeloupe  Supervising Physician: Aletta Edouard  Patient Status: [facility] - Out-pt  History of Present Illness: Richard Summers is a 79 y.o. 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 Richard Summers began to have bilateral lower extremity weakness in early 2020 and was noted to have a small M protein of 0.6 g IgM kappa when SPEP was checked which was consistent with MGUS. He was referred to heme/onc for further evaluation and has been followed since 05/29/2019 by Dr. Janese Banks. 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 He has been referred to IR for an image guided bone marrow biopsy for further evaluation.  Richard Summers denies any complaints today, he tells me that he has been researching a lot about multiple myeloma and bone marrow biopsies so he feels very prepared for today. He also tells me he had heart surgery previously with a Da Vinc[employer] robot and still has a video from this procedure that he watches occasionally. He is not really interested in eating after the procedure as he usually only eats a banana for breakfast, some grapes for lunch and then a larger meal for dinner - but if he has to eat something he will try.  Past Medical History:  Diagnosis Date  . Allergic rhinitis   . Allergy   . Barrett's esophagus   . Cataract cortical, senile   . 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 Epilepsy ([place])   . GERD (gastroesophageal reflux disease)   . Glaucoma   . Hyperlipidemia   . Hypertension   . Insomnia   . MGUS (monoclonal gammopathy of unknown significance)   .  Myocardial infarction ([place])   . Nephrolithiasis   . Osteoporosis   . S/P triple vessel bypass   . Sleep apnea   . 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 ANKLE SURGERY    . broken leg    . CARDIAC SURGERY    . CORONARY ARTERY BYPASS GRAFT     triple  . FRACTURE SURGERY     Fibula and Tibia. Plates and screws installed  . TONSILLECTOMY    . triple bypass    . VASECTOMY      Allergies: Penicillins, Sulfa antibiotics, Ezetimibe, Rosuvastatin, and Statins  Medications: Prior to Admission medications   Medication Sig Start Date End Date Taking? Authorizing Provider  aspirin EC 81 MG tablet Take 81 mg by mouth daily.    [provider]  azelastine (ASTELIN) 0.1 % nasal spray Place 1 spray into the nose daily.  03/30/19 03/29/20  [provider]  cholestyramine Lucrezia Starch) 4 GM/DOSE powder Take 27 grams by mouth twice a day before meals. Do not take more than 54 grams per 24 hours. 12/01/18   [provider]  clindamycin (CLEOCIN) 150 MG capsule as needed For dental PRN Patient not taking: Reported on 06/27/2020 03/28/17   [provider]  hydrochlorothiazide (HYDRODIURIL) 25 MG tablet Take 25 mg by mouth daily.  10/06/18   [provider]  latanoprost (XALATAN) 0.005 % ophthalmic solution Place 1 drop into both eyes at bedtime.  02/23/19   [provider]  levETIRAcetam (KEPPRA XR) 500 MG 24 hr tablet Take 2,000 mg by mouth daily.     [provider]  Loratadine 10 MG CAPS Take 10 mg by mouth daily.     [provider]  losartan (COZAAR) 100 MG tablet Take 100 mg by mouth daily.  02/09/19   [provider]  losartan (COZAAR) 50 MG tablet Take 50 mg by mouth 2 (two) times daily. 11/26/19   [provider]  montelukast (SINGULAIR) 10 MG tablet Take 10 mg by mouth daily.  02/09/19   [provider]  niacin (NIASPAN) 500 MG  CR tablet Take 500 mg by mouth in the morning and at bedtime.  12/01/18 06/27/20  [provider]  pantoprazole (PROTONIX) 40 MG tablet Take 40 mg by mouth daily.  10/06/18   [provider]     Family History  Problem Relation Age of Onset  . Seizures Mother   . Kidney cancer Father     Social History   Socioeconomic History  . Marital status: Married    Spouse name: Not on file  . Number of children: Not on file  . Years of education: Not on file  . Highest education level: Not on file  Occupational History  . Not on file  Tobacco Use  . Smoking status: Former Smoker    Types: 56, Pipe    Quit date: 10/29/2007    Years since quitting: 12.7  . Smokeless tobacco: Never Used  Vaping Use  . Vaping Use: Never used  Substance and Sexual Activity  . Alcohol use: Yes    Alcohol/week: 7.0 standard drinks    Types: 7 Shots of liquor per week    Comment: vodka in evening  . Drug use: Never  . Sexual activity: Not Currently  Other Topics Concern  . Not on file  Social History Narrative  . Not on file   Social Determinants of Health   Financial Resource Strain:   . Difficulty of Paying Living Expenses: Not on file  Food Insecurity:   . Worried About Charity fundraiser in the Last Year: Not on file  . Ran Out of Food in the Last Year: Not on file  Transportation Needs:   . Lack of Transportation (Medical): Not on file  . Lack of Transportation (Non-Medical): Not on file  Physical Activity:   . Days of Exercise per Week: Not on file  . Minutes of Exercise per Session: Not on file  Stress:   . Feeling of Stress : Not on file  Social Connections:   . Frequency of Communication with Friends and Family: Not on file  . Frequency of Social Gatherings with Friends and Family: Not on file  . Attends Religious Services: Not on file  . Active Member of Clubs or Organizations: Not on file  . Attends Archivist Meetings: Not on file  . Marital Status:  Not on file     Review of Systems: A 12 point ROS discussed and pertinent positives are indicated in the HPI above.  All other systems are negative.  Review of Systems  Constitutional: Negative for appetite change, chills and fever.  Respiratory: Negative for cough and shortness of breath.   Cardiovascular: Negative for chest pain.  Gastrointestinal: Negative for abdominal pain, diarrhea, nausea and vomiting.  Musculoskeletal: Negative for back pain.  Skin:  Negative for rash.  Neurological: Negative for dizziness and headaches.  Hematological: Does not bruise/bleed easily.    Vital Signs: There were no vitals taken for this visit.  Physical Exam Vitals reviewed.  Constitutional:      General: He is not in acute distress. HENT:     Head: Normocephalic.     Mouth/Throat:     Mouth: Mucous membranes are moist.     Pharynx: Oropharynx is clear. No oropharyngeal exudate or posterior oropharyngeal erythema.  Cardiovascular:     Rate and Rhythm: Normal rate and regular rhythm.  Pulmonary:     Effort: Pulmonary effort is normal.     Breath sounds: Normal breath sounds.  Abdominal:     General: There is no distension.     Palpations: Abdomen is soft.     Tenderness: There is no abdominal tenderness.  Skin:    General: Skin is warm and dry.  Neurological:     Mental Status: He is alert and oriented to person, place, and time.  Psychiatric:        Mood and Affect: Mood normal.        Behavior: Behavior normal.        Thought Content: Thought content normal.        Judgment: Judgment normal.      MD Evaluation Airway: WNL Heart: WNL Abdomen: WNL Chest/ Lungs: WNL ASA  Classification: 2 Mallampati/Airway Score: Two   Imaging: No results found.  Labs:  CBC: Recent Labs    09/11/19 1136 12/18/19 1044 03/25/20 1136 06/27/20 1103  WBC 5.8 5.7 5.7 6.7  HGB 15.4 16.1 15.4 15.3  HCT 45.3 46.3 43.9 42.3  PLT 133* 137* 139* 137*    COAGS: No results for  input(s): INR, APTT in the last 8760 hours.  BMP: Recent Labs    09/11/19 1136 12/18/19 1044 03/25/20 1136 06/27/20 1103  NA 143 139 142 141  K 4.3 3.8 3.9 3.7  CL 107 104 104 107  CO2 _0 GLUCOSE 131* 131* 115* 111*  BUN 31* 26* 31* 29*  CALCIUM 9.7 9.6 9.2 8.9  CREATININE 0.77 0.96 0.78 0.95  GFRNONAA >60 >60 >60 >60  GFRAA >60 >60 >60 >60    LIVER FUNCTION TESTS: Recent Labs    09/11/19 1136 12/18/19 1044 03/25/20 1136 06/27/20 1103  BILITOT 1.5* 1.3* 1.4* 1.4*  AST _1 ALT _2 ALKPHOS 64 81 70 59  PROT 7.5 8.2* 7.5 7.2  ALBUMIN 4.4 4.8 4.4 4.3    TUMOR MARKERS: No results for input(s): AFPTM, CEA, CA199, CHROMGRNA in the last 8760 hours.  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  Patient has been NPO since midnight, afebrile, WBC 4.8, hgb 14.4, plt 133.  Risks and benefits of bone marrow biopsy was discussed with the patient and/or patient's family including, but not limited to bleeding, infection, damage to adjacent structures or low yield requiring additional tests.  All of the questions were answered and there is agreement to proceed.  Consent signed and in chart.   Thank you for this interesting consult.  I greatly enjoyed meeting Richard Summers and look forward to participating in their care.  A copy of this report was sent to the requesting  provider on this date.  Electronically Signed: Joaquim Nam, PA-C 07/25/2020, 8:16 AM   I spent a total of  30 Minutes in face to face in clinical consultation, greater than 50% of which was counseling/coordinating care for bone marrow biopsy.

## 2020-07-25 NOTE — Procedures (Signed)
Interventional Radiology Procedure Note  Procedure: CT guided bone marrow aspiration and biopsy  Complications: None  EBL: < 10 mL  Findings: Aspirate and core biopsy performed of bone marrow in right iliac bone.  Plan: Bedrest supine x 1 hrs  Princes Finger T. Chenise Mulvihill, M.D Pager:  [phone]   

## 2020-07-26 ENCOUNTER — Telehealth: Payer: Self-pay | Admitting: *Deleted

## 2020-07-26 NOTE — Telephone Encounter (Signed)
Patient called reporting that he is still having effects from the Versed and Fentanyl he got yesterday for his BM biopsy. He is feeling weak and tired. Any recommendations? I advised him to take it easy today which he said he is doing

## 2020-07-26 NOTE — Telephone Encounter (Signed)
Yes that's all and take plenty of fluids. It should be out of his system by now

## 2020-07-26 NOTE — Telephone Encounter (Signed)
Patient advised to drink plenty of fluids which he reports that he has already had 3 pints of water this morning. He states he will continue to push fluid and let us know if anything else happens

## 2020-07-27 LAB — SURGICAL PATHOLOGY

## 2020-08-03 ENCOUNTER — Encounter (HOSPITAL_COMMUNITY): Payer: Self-pay | Admitting: Oncology

## 2020-08-05 ENCOUNTER — Encounter (HOSPITAL_COMMUNITY): Payer: Self-pay | Admitting: Oncology

## 2020-08-07 LAB — SURGICAL PATHOLOGY

## 2020-08-12 ENCOUNTER — Encounter: Payer: Self-pay | Admitting: *Deleted

## 2020-08-12 ENCOUNTER — Encounter: Payer: Self-pay | Admitting: Oncology

## 2020-08-12 ENCOUNTER — Inpatient Hospital Stay: Payer: Medicare Other | Attending: Oncology | Admitting: Oncology

## 2020-08-12 VITALS — BP 186/90 | HR 72 | Temp 97.5°F | Resp 16 | Wt 163.0 lb

## 2020-08-12 DIAGNOSIS — Z79899 Other long term (current) drug therapy: Secondary | ICD-10-CM | POA: Diagnosis not present

## 2020-08-12 DIAGNOSIS — R768 Other specified abnormal immunological findings in serum: Secondary | ICD-10-CM

## 2020-08-12 DIAGNOSIS — E785 Hyperlipidemia, unspecified: Secondary | ICD-10-CM | POA: Diagnosis not present

## 2020-08-12 DIAGNOSIS — Z7982 Long term (current) use of aspirin: Secondary | ICD-10-CM | POA: Insufficient documentation

## 2020-08-12 DIAGNOSIS — Z87891 Personal history of nicotine dependence: Secondary | ICD-10-CM | POA: Diagnosis not present

## 2020-08-12 DIAGNOSIS — D472 Monoclonal gammopathy: Secondary | ICD-10-CM | POA: Insufficient documentation

## 2020-08-17 NOTE — Progress Notes (Signed)
Hematology/Oncology Consult note [facility]  Telephone:([phone] [phone]  Patient Care Team: Harrel Lemon, MD as PCP - General (Internal Medicine) Sindy Guadeloupe, MD as Consulting Physician (Oncology)   Name of the patient: Richard Summers  [id number]  21-Nov-1940   Date of visit: 08/17/20  Diagnosis- IgM MGUS  Chief complaint/ Reason for visit- discuss bone marrow biopsy results  Heme/Onc history: Patient is a 79 year old male with a past medical history significant for hyperlipidemia who has been having right lower extremity weakness and was recently seen by neurology. Patient states that he was very active up until a year ago when he used to exercise in an apartment bike for 3 hours but presently is barely able to do it for 10 minutes. He still ambulates independently but after he stands up for a while he feels as if his legs are giving way. He did undergo nerve conduction studies with Dr. Manuella Ghazi as well. As a work-up of his right lower extremity weakness patient had an SPEP checked which showed a monoclonal protein of 0.6 g IgM kappa. Of note patient has a normal CBC and a CMP in April 2020. His calcium levels in the past have also been normal.  Results of blood work from August 2020 showed normal CBC with a mild thrombocytopenia of 146. Serum creatinine and calcium were normal. Serum free light chain was elevated at 6.1.  Bone marrow biopsy showed a normocellular bone marrow with mild monotypic plasmacytosis 1% on aspirate and 5% by IHC.  Abnormal male karyotype with loss of Y chromosome which could be seen in older males FISH panel for myeloma was normal  Interval history-patient reports feeling well and denies any complaints at this time other than fatigue which is mild  ECOG PS- 1 Pain scale- 0   Review of systems- Review of Systems  Constitutional: Positive for malaise/fatigue. Negative for chills, fever and weight loss.    HENT: Negative for congestion, ear discharge and nosebleeds.   Eyes: Negative for blurred vision.  Respiratory: Negative for cough, hemoptysis, sputum production, shortness of breath and wheezing.   Cardiovascular: Negative for chest pain, palpitations, orthopnea and claudication.  Gastrointestinal: Negative for abdominal pain, blood in stool, constipation, diarrhea, heartburn, melena, nausea and vomiting.  Genitourinary: Negative for dysuria, flank pain, frequency, hematuria and urgency.  Musculoskeletal: Negative for back pain, joint pain and myalgias.  Skin: Negative for rash.  Neurological: Negative for dizziness, tingling, focal weakness, seizures, weakness and headaches.  Endo/Heme/Allergies: Does not bruise/bleed easily.  Psychiatric/Behavioral: Negative for depression and suicidal ideas. The patient does not have insomnia.      Allergies  Allergen Reactions  . Penicillins Anaphylaxis, Hives and Other (See Comments)  . Sulfa Antibiotics Anaphylaxis, Hives and Other (See Comments)  . Ezetimibe Other (See Comments)  . Rosuvastatin Other (See Comments)  . Statins     Other reaction(s): Other (See Comments) Confusion and mania      Past Medical History:  Diagnosis Date  . Allergic rhinitis   . Allergy   . Barrett's esophagus   . Cataract cortical, senile   . 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 Epilepsy ([place])   . GERD (gastroesophageal reflux disease)   . Glaucoma   . Hyperlipidemia   . Hypertension   . Insomnia   . MGUS (monoclonal gammopathy of unknown significance)   . Myocardial infarction ([place])   . Nephrolithiasis   . Osteoporosis   .  S/P triple vessel bypass   . Sleep apnea   . 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 ANKLE SURGERY    . broken leg    . CARDIAC SURGERY    . CORONARY ARTERY BYPASS GRAFT     triple  . FRACTURE SURGERY      Fibula and Tibia. Plates and screws installed  . TONSILLECTOMY    . triple bypass    . VASECTOMY      Social History   Socioeconomic History  . Marital status: Married    Spouse name: Not on file  . Number of children: Not on file  . Years of education: Not on file  . Highest education level: Not on file  Occupational History  . Not on file  Tobacco Use  . Smoking status: Former Smoker    Types: 83, Pipe    Quit date: 10/29/2007    Years since quitting: 12.8  . Smokeless tobacco: Never Used  Vaping Use  . Vaping Use: Never used  Substance and Sexual Activity  . Alcohol use: Yes    Alcohol/week: 7.0 standard drinks    Types: 7 Shots of liquor per week    Comment: daily  . Drug use: Never  . Sexual activity: Not Currently  Other Topics Concern  . Not on file  Social History Narrative  . Not on file   Social Determinants of Health   Financial Resource Strain:   . Difficulty of Paying Living Expenses: Not on file  Food Insecurity:   . Worried About Charity fundraiser in the Last Year: Not on file  . Ran Out of Food in the Last Year: Not on file  Transportation Needs:   . Lack of Transportation (Medical): Not on file  . Lack of Transportation (Non-Medical): Not on file  Physical Activity:   . Days of Exercise per Week: Not on file  . Minutes of Exercise per Session: Not on file  Stress:   . Feeling of Stress : Not on file  Social Connections:   . Frequency of Communication with Friends and Family: Not on file  . Frequency of Social Gatherings with Friends and Family: Not on file  . Attends Religious Services: Not on file  . Active Member of Clubs or Organizations: Not on file  . Attends Archivist Meetings: Not on file  . Marital Status: Not on file  Intimate Partner Violence:   . Fear of Current or Ex-Partner: Not on file  . Emotionally Abused: Not on file  . Physically Abused: Not on file  . Sexually Abused: Not on file    Family History   Problem Relation Age of Onset  . Seizures Mother   . Kidney cancer Father      Current Outpatient Medications:  .  aspirin EC 81 MG tablet, Take 81 mg by mouth daily., Disp: , Rfl:  .  cholestyramine (QUESTRAN) 4 GM/DOSE powder, Take 27 grams by mouth twice a day before meals. Do not take more than 54 grams per 24 hours., Disp: , Rfl:  .  clindamycin (CLEOCIN) 150 MG capsule, as needed For dental PRN, Disp: , Rfl:  .  hydrochlorothiazide (HYDRODIURIL) 25 MG tablet, Take 25 mg by mouth daily. , Disp: , Rfl:  .  latanoprost (XALATAN) 0.005 % ophthalmic solution, Place 1 drop into both eyes at bedtime. , Disp: , Rfl:  .  levETIRAcetam (KEPPRA XR) 500 MG 24 hr tablet, Take 2,000 mg by mouth daily. , Disp: , Rfl:  .  Loratadine 10 MG CAPS, Take 10 mg by mouth daily. , Disp: , Rfl:  .  losartan (COZAAR) 100 MG tablet, Take 100 mg by mouth daily. , Disp: , Rfl:  .  losartan (COZAAR) 50 MG tablet, Take 50 mg by mouth 2 (two) times daily., Disp: , Rfl:  .  montelukast (SINGULAIR) 10 MG tablet, Take 10 mg by mouth daily. , Disp: , Rfl:  .  pantoprazole (PROTONIX) 40 MG tablet, Take 40 mg by mouth daily. , Disp: , Rfl:  .  SYSTANE COMPLETE 0.6 % SOLN, Apply to eye., Disp: , Rfl:  .  VASCEPA 0.5 g CAPS, Take by mouth., Disp: , Rfl:  .  azelastine (ASTELIN) 0.1 % nasal spray, Place 1 spray into the nose daily. , Disp: , Rfl:  .  niacin (NIASPAN) 500 MG CR tablet, Take 500 mg by mouth in the morning and at bedtime. , Disp: , Rfl:   Physical exam:  Vitals:   08/12/20 1121  BP: (!) 186/90  Pulse: 72  Resp: 16  Temp: (!) 97.5 F (36.4 C)  TempSrc: Tympanic  SpO2: 96%  Weight: 163 lb (73.9 kg)   Physical Exam Constitutional:      General: He is not in acute distress. Eyes:     Pupils: Pupils are equal, round, and reactive to light.  Cardiovascular:     Rate and Rhythm: Normal rate and regular rhythm.  Pulmonary:     Effort: Pulmonary effort is normal.  Skin:    General: Skin is warm and  dry.  Neurological:     Mental Status: He is alert and oriented to person, place, and time.      CMP Latest Ref Rng & Units 06/27/2020  Glucose 70 - 99 mg/dL 111(H)  BUN 8 - 23 mg/dL 29(H)  Creatinine 0.61 - 1.24 mg/dL 0.95  Sodium 135 - 145 mmol/L 141  Potassium 3.5 - 5.1 mmol/L 3.7  Chloride 98 - 111 mmol/L 107  CO2 22 - 32 mmol/L 25  Calcium 8.9 - 10.3 mg/dL 8.9  Total Protein 6.5 - 8.1 g/dL 7.2  Total Bilirubin 0.3 - 1.2 mg/dL 1.4(H)  Alkaline Phos 38 - 126 U/L 59  AST 15 - 41 U/L 18  ALT 0 - 44 U/L 18   CBC Latest Ref Rng & Units 07/25/2020  WBC 4.0 - 10.5 K/uL 4.8  Hemoglobin 13.0 - 17.0 g/dL 14.4  Hematocrit 39 - 52 % 41.4  Platelets 150 - 400 K/uL 133(L)    No images are attached to the encounter.  CT BONE MARROW BIOPSY & ASPIRATION  Result Date: 07/25/2020 CLINICAL DATA:  Monoclonal gammopathy of unknown significance and need for bone marrow biopsy. EXAM: CT GUIDED BONE MARROW ASPIRATION AND BIOPSY ANESTHESIA/SEDATION: Versed 2.0 mg IV, Fentanyl 75 mcg IV Total Moderate Sedation Time:   16 minutes. The patient's level of consciousness and physiologic status were continuously monitored during the procedure by Radiology nursing. PROCEDURE: The procedure risks, benefits, and alternatives were explained to the patient. Questions regarding the procedure were encouraged and answered. The patient understands and consents to the procedure. A time out was performed prior to initiating the procedure. The right gluteal region was prepped with chlorhexidine. Sterile gown and sterile gloves were used for the procedure. Local anesthesia was provided with 1% Lidocaine. Under CT guidance, an 11 gauge On Control bone cutting needle was  advanced from a posterior approach into the right iliac bone. Needle positioning was confirmed with CT. Initial non heparinized and heparinized aspirate samples were obtained of bone marrow. Core biopsy was performed via the On Control drill needle.  COMPLICATIONS: None FINDINGS: Inspection of initial aspirate did reveal visible particles. Intact core biopsy sample was obtained. IMPRESSION: CT guided bone marrow biopsy of right posterior iliac bone with both aspirate and core samples obtained. Electronically Signed   By: Aletta Edouard M.D.   On: 07/25/2020 10:24     Assessment and plan- Patient is a 79 y.o. 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  However bone marrow biopsy shows mild monotypic plasmacytosis 1% on aspirin 5% by IHC.  Patient therefore does not have any evidence of smoldering multiple myeloma or overt multiple myeloma.  He has IgM MGUS which can be monitored conservatively without any intervention at this time.  I will see him back in 6 months with CBC with differential CMP myeloma panel and serum free light chains   Visit Diagnosis 1. Elevated serum immunoglobulin free light chain level   2. MGUS (monoclonal gammopathy of unknown significance)      Dr. Randa Evens, MD, MPH [facility] [phone] 08/17/2020 11:40 AM

## 2020-09-26 ENCOUNTER — Other Ambulatory Visit: Payer: Medicare Other

## 2020-11-16 ENCOUNTER — Other Ambulatory Visit: Payer: Self-pay

## 2020-11-16 ENCOUNTER — Encounter: Payer: Self-pay | Admitting: Urology

## 2020-11-16 ENCOUNTER — Ambulatory Visit (INDEPENDENT_AMBULATORY_CARE_PROVIDER_SITE_OTHER): Payer: Medicare Other | Admitting: Urology

## 2020-11-16 VITALS — BP 142/80 | HR 72 | Ht 69.0 in | Wt 163.0 lb

## 2020-11-16 DIAGNOSIS — R351 Nocturia: Secondary | ICD-10-CM

## 2020-11-16 LAB — BLADDER SCAN AMB NON-IMAGING: Scan Result: 13

## 2020-11-16 MED ORDER — TROSPIUM CHLORIDE 20 MG PO TABS: 20.0000 mg | ORAL_TABLET | Freq: Two times a day (BID) | ORAL | 0 refills | Status: DC

## 2020-11-16 NOTE — Progress Notes (Signed)
11/16/2020 9:30 AM   Richard Summers 1940-11-17 [id number]  Referring provider: Baxter Hire, MD [place],  [place] [address]  Chief Complaint  Patient presents with  . Urinary Frequency    HPI: Richard Summers is a 80 y.o. male referred for urinary frequency.   Previously no bothersome lower urinary tract symptoms  4-5 weeks ago he began to experience nocturia x2 which has been bothersome  Has limited nighttime fluids  No bothersome daytime LUTS  Denies dysuria, gross hematuria  No flank, abdominal or pelvic pain  Does have sleep apnea on CPAP  Urinalysis November 2021 was normal   PMH: Past Medical History:  Diagnosis Date  . Allergic rhinitis   . Allergy   . Barrett's esophagus   . Cataract cortical, senile   . 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 Epilepsy ([place])   . GERD (gastroesophageal reflux disease)   . Glaucoma   . Hyperlipidemia   . Hypertension   . Insomnia   . MGUS (monoclonal gammopathy of unknown significance)   . Myocardial infarction ([place])   . Nephrolithiasis   . Osteoporosis   . S/P triple vessel bypass   . Sleep apnea   . 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 ANKLE SURGERY    . broken leg    . CARDIAC SURGERY    . CORONARY ARTERY BYPASS GRAFT     triple  . FRACTURE SURGERY     Fibula and Tibia. Plates and screws installed  . TONSILLECTOMY    . triple bypass    . VASECTOMY      Home Medications:  Allergies as of 11/16/2020      Reactions   Penicillins Anaphylaxis, Hives, Other (See Comments)   Sulfa Antibiotics Anaphylaxis, Hives, Other (See Comments)   Ezetimibe Other (See Comments)   Rosuvastatin Other (See Comments)   Statins    Other reaction(s): Other (See Comments) Confusion and mania      Medication List       Accurate as of November 16, 2020  9:30  AM. If you have any questions, ask your nurse or doctor.        aspirin EC 81 MG tablet Take 81 mg by mouth daily.   azelastine 0.1 % nasal spray Commonly known as: ASTELIN Place 1 spray into the nose daily.   cholestyramine 4 GM/DOSE powder Commonly known as: QUESTRAN Take 27 grams by mouth twice a day before meals. Do not take more than 54 grams per 24 hours.   clindamycin 150 MG capsule Commonly known as: CLEOCIN as needed For dental PRN   hydrochlorothiazide 25 MG tablet Commonly known as: HYDRODIURIL Take 25 mg by mouth daily.   latanoprost 0.005 % ophthalmic solution Commonly known as: XALATAN Place 1 drop into both eyes at bedtime.   levETIRAcetam 500 MG 24 hr tablet Commonly known as: KEPPRA XR Take 2,000 mg by mouth daily.   Loratadine 10 MG Caps Take 10 mg by mouth daily.   losartan 100 MG tablet Commonly known as: COZAAR Take 100 mg by mouth daily.   losartan 50 MG tablet Commonly known as: COZAAR Take 50 mg by mouth 2 (two) times daily.   montelukast 10 MG tablet Commonly known as: SINGULAIR Take 10 mg by mouth daily.   niacin 500 MG CR tablet Commonly known as: NIASPAN  Take 500 mg by mouth in the morning and at bedtime.   pantoprazole 40 MG tablet Commonly known as: PROTONIX Take 40 mg by mouth daily.   Systane Complete 0.6 % Soln Generic drug: Propylene Glycol Apply to eye.   Vascepa 0.5 g Caps Generic drug: Icosapent Ethyl Take by mouth.       Allergies:  Allergies  Allergen Reactions  . Penicillins Anaphylaxis, Hives and Other (See Comments)  . Sulfa Antibiotics Anaphylaxis, Hives and Other (See Comments)  . Ezetimibe Other (See Comments)  . Rosuvastatin Other (See Comments)  . Statins     Other reaction(s): Other (See Comments) Confusion and mania     Family History: Family History  Problem Relation Age of Onset  . Seizures Mother   . Kidney cancer Father     Social History:  reports that he quit smoking about 13  years ago. His smoking use included cigars and pipe. He has never used smokeless tobacco. He reports current alcohol use of about 7.0 standard drinks of alcohol per week. He reports that he does not use drugs.   Physical Exam: BP (!) 142/80   Pulse 72   Ht 5\' 9"  (1.753 m)   Wt 163 lb (73.9 kg)   BMI 24.07 kg/m   Constitutional:  Alert and oriented, No acute distress. HEENT: [place] AT, moist mucus membranes.  Trachea midline, no masses. Cardiovascular: No clubbing, cyanosis, or edema. Respiratory: Normal respiratory effort, no increased work of breathing. GU: Prostate 40 g, smooth without nodules    Assessment & Plan:    1.  Nocturia  Bladder scan PVR 13 mL  We discussed nocturia can be a difficult problem to treat  He is on CPAP though his machine recently broke and states he cannot get a replacement for 1 year  Was interested in a trial of medical management and Rx trospium 20 mg 1 hour prior to bedtime was sent to pharmacy  Will call back regarding efficacy   Abbie Sons, [place] [address] [place], [place] [address] [phone]

## 2020-11-28 ENCOUNTER — Other Ambulatory Visit: Payer: Self-pay

## 2020-11-28 ENCOUNTER — Ambulatory Visit (INDEPENDENT_AMBULATORY_CARE_PROVIDER_SITE_OTHER): Payer: Medicare Other | Admitting: Dermatology

## 2020-11-28 DIAGNOSIS — L570 Actinic keratosis: Secondary | ICD-10-CM | POA: Diagnosis not present

## 2020-11-28 DIAGNOSIS — L578 Other skin changes due to chronic exposure to nonionizing radiation: Secondary | ICD-10-CM

## 2020-11-28 NOTE — Progress Notes (Signed)
   Follow-Up Visit   Subjective  Richard Summers is a 80 y.o. male who presents for the following: Follow-up (Patient here today for 6 month follow up for actinic keratosis on scalp. ).  The following portions of the chart were reviewed this encounter and updated as appropriate:  Tobacco  Allergies  Meds  Problems  Med Hx  Surg Hx  Fam Hx      Objective  Well appearing patient in no apparent distress; mood and affect are within normal limits.  A focused examination was performed including scalp, face . Relevant physical exam findings are noted in the Assessment and Plan.  Objective  Scalp x 5 (5): Erythematous thin papules/macules with gritty scale.   Assessment & Plan    Actinic Damage - chronic, secondary to cumulative UV radiation exposure/sun exposure over time - diffuse scaly erythematous macules with underlying dyspigmentation - Recommend daily broad spectrum sunscreen SPF 30+ to sun-exposed areas, reapply every 2 hours as needed.  - Call for new or changing lesions.  Actinic keratosis Scalp x 5 Hyperkeratotic  Prior to procedure, discussed risks of blister formation, small wound, skin dyspigmentation, or rare scar following cryotherapy.  Destruction of lesion - Scalp x 5 Complexity: simple   Destruction method: cryotherapy   Informed consent: discussed and consent obtained   Timeout:  patient name, date of birth, surgical site, and procedure verified Lesion destroyed using liquid nitrogen: Yes   Region frozen until ice ball extended beyond lesion: Yes   Outcome: patient tolerated procedure well with no complications   Post-procedure details: wound care instructions given    Return in about 3 months (around 02/25/2021) for ak followup.  IRuthell Rummage, CMA, am acting as scribe for Sarina Ser, MD.  Documentation: I have reviewed the above documentation for accuracy and completeness, and I agree with the above.  Sarina Ser, MD

## 2020-11-28 NOTE — Patient Instructions (Signed)
Cryotherapy Aftercare  . Wash gently with soap and water everyday.   . Apply Vaseline and Band-Aid daily until healed.  

## 2020-11-29 ENCOUNTER — Encounter: Payer: Self-pay | Admitting: Dermatology

## 2020-12-09 ENCOUNTER — Other Ambulatory Visit: Payer: Self-pay | Admitting: *Deleted

## 2020-12-09 ENCOUNTER — Encounter: Payer: Self-pay | Admitting: Urology

## 2020-12-09 MED ORDER — TROSPIUM CHLORIDE 20 MG PO TABS
20.0000 mg | ORAL_TABLET | Freq: Two times a day (BID) | ORAL | 2 refills | Status: DC
Start: 2020-12-09 — End: 2021-02-13

## 2020-12-27 ENCOUNTER — Ambulatory Visit: Payer: Medicare Other | Admitting: Oncology

## 2020-12-27 ENCOUNTER — Other Ambulatory Visit: Payer: Medicare Other

## 2021-02-06 ENCOUNTER — Inpatient Hospital Stay: Payer: Medicare Other

## 2021-02-08 ENCOUNTER — Other Ambulatory Visit: Payer: Self-pay

## 2021-02-08 ENCOUNTER — Inpatient Hospital Stay: Payer: Medicare Other | Attending: Oncology

## 2021-02-08 ENCOUNTER — Encounter: Payer: Self-pay | Admitting: Oncology

## 2021-02-08 DIAGNOSIS — R768 Other specified abnormal immunological findings in serum: Secondary | ICD-10-CM

## 2021-02-08 DIAGNOSIS — D472 Monoclonal gammopathy: Secondary | ICD-10-CM

## 2021-02-08 DIAGNOSIS — D696 Thrombocytopenia, unspecified: Secondary | ICD-10-CM | POA: Insufficient documentation

## 2021-02-08 DIAGNOSIS — Z79899 Other long term (current) drug therapy: Secondary | ICD-10-CM | POA: Diagnosis not present

## 2021-02-08 DIAGNOSIS — Z87891 Personal history of nicotine dependence: Secondary | ICD-10-CM | POA: Diagnosis not present

## 2021-02-08 LAB — CBC WITH DIFFERENTIAL/PLATELET
Abs Immature Granulocytes: 0.04 10*3/uL (ref 0.00–0.07)
Basophils Absolute: 0 10*3/uL (ref 0.0–0.1)
Basophils Relative: 1 %
Eosinophils Absolute: 0.2 10*3/uL (ref 0.0–0.5)
Eosinophils Relative: 3 %
HCT: 45.7 % (ref 39.0–52.0)
Hemoglobin: 16 g/dL (ref 13.0–17.0)
Immature Granulocytes: 1 %
Lymphocytes Relative: 24 %
Lymphs Abs: 1.4 10*3/uL (ref 0.7–4.0)
MCH: 32.5 pg (ref 26.0–34.0)
MCHC: 35 g/dL (ref 30.0–36.0)
MCV: 92.9 fL (ref 80.0–100.0)
Monocytes Absolute: 0.7 10*3/uL (ref 0.1–1.0)
Monocytes Relative: 12 %
Neutro Abs: 3.6 10*3/uL (ref 1.7–7.7)
Neutrophils Relative %: 59 %
Platelets: 144 10*3/uL — ABNORMAL LOW (ref 150–400)
RBC: 4.92 MIL/uL (ref 4.22–5.81)
RDW: 12.1 % (ref 11.5–15.5)
WBC: 6 10*3/uL (ref 4.0–10.5)
nRBC: 0 % (ref 0.0–0.2)

## 2021-02-08 LAB — COMPREHENSIVE METABOLIC PANEL
ALT: 18 U/L (ref 0–44)
AST: 19 U/L (ref 15–41)
Albumin: 4.3 g/dL (ref 3.5–5.0)
Alkaline Phosphatase: 58 U/L (ref 38–126)
Anion gap: 10 (ref 5–15)
BUN: 32 mg/dL — ABNORMAL HIGH (ref 8–23)
CO2: 27 mmol/L (ref 22–32)
Calcium: 9.1 mg/dL (ref 8.9–10.3)
Chloride: 103 mmol/L (ref 98–111)
Creatinine, Ser: 0.93 mg/dL (ref 0.61–1.24)
GFR, Estimated: >60 mL/min (ref >60)
Glucose, Bld: 116 mg/dL — ABNORMAL HIGH (ref 70–99)
Potassium: 3.9 mmol/L (ref 3.5–5.1)
Sodium: 140 mmol/L (ref 135–145)
Total Bilirubin: 1.2 mg/dL (ref 0.3–1.2)
Total Protein: 7.4 g/dL (ref 6.5–8.1)

## 2021-02-09 LAB — KAPPA/LAMBDA LIGHT CHAINS
Kappa free light chain: 80.1 mg/L — ABNORMAL HIGH (ref 3.3–19.4)
Kappa, lambda light chain ratio: 8.43 — ABNORMAL HIGH (ref 0.26–1.65)
Lambda free light chains: 9.5 mg/L (ref 5.7–26.3)

## 2021-02-13 ENCOUNTER — Encounter: Payer: Self-pay | Admitting: Oncology

## 2021-02-13 ENCOUNTER — Inpatient Hospital Stay (HOSPITAL_BASED_OUTPATIENT_CLINIC_OR_DEPARTMENT_OTHER): Payer: Medicare Other | Admitting: Oncology

## 2021-02-13 VITALS — BP 150/82 | HR 70 | Temp 99.0°F | Resp 16 | Ht 69.0 in | Wt 173.5 lb

## 2021-02-13 DIAGNOSIS — D472 Monoclonal gammopathy: Secondary | ICD-10-CM | POA: Diagnosis not present

## 2021-02-13 LAB — MULTIPLE MYELOMA PANEL, SERUM
Albumin SerPl Elph-Mcnc: 3.9 g/dL (ref 2.9–4.4)
Albumin/Glob SerPl: 1.3 (ref 0.7–1.7)
Alpha 1: 0.2 g/dL (ref 0.0–0.4)
Alpha2 Glob SerPl Elph-Mcnc: 0.7 g/dL (ref 0.4–1.0)
B-Globulin SerPl Elph-Mcnc: 0.9 g/dL (ref 0.7–1.3)
Gamma Glob SerPl Elph-Mcnc: 1.3 g/dL (ref 0.4–1.8)
Globulin, Total: 3.1 g/dL (ref 2.2–3.9)
IgA: 60 mg/dL — ABNORMAL LOW (ref 61–437)
IgG (Immunoglobin G), Serum: 652 mg/dL (ref 603–1613)
IgM (Immunoglobulin M), Srm: 820 mg/dL — ABNORMAL HIGH (ref 15–143)
M Protein SerPl Elph-Mcnc: 0.7 g/dL — ABNORMAL HIGH
Total Protein ELP: 7 g/dL (ref 6.0–8.5)

## 2021-02-13 NOTE — Progress Notes (Signed)
Hematology/Oncology Consult note [facility]  [phone] [phone]  Patient Care Team: Harrel Lemon, MD as PCP - General (Internal Medicine) Sindy Guadeloupe, MD as Consulting Physician (Oncology)   Name of the patient: Richard Summers  [id number]  10/03/1940   Date of visit: 02/13/21  Diagnosis-IgM kappa MGUS  Chief complaint/ Reason for visit-routine follow-up of MGUS  Heme/Onc history: Patient is a 80 year old male with a past medical history significant for hyperlipidemia who has been having right lower extremity weakness and was recently seen by neurology. Patient states that he was very active up until a year ago when he used to exercise in an apartment bike for 3 hours but presently is barely able to do it for 10 minutes. He still ambulates independently but after he stands up for a while he feels as if his legs are giving way. He did undergo nerve conduction studies with Dr. Manuella Ghazi as well. As a work-up of his right lower extremity weakness patient had an SPEP checked which showed a monoclonal protein of 0.6 g IgM kappa. Of note patient has a normal CBC and a CMP in April 2020. His calcium levels in the past have also been normal.  Results of blood work from August 2020 showed normal CBC with a mild thrombocytopenia of 146. Serum creatinine and calcium were normal. Serum free light chain was elevated at 6.1.  Bone marrow biopsy showed a normocellular bone marrow with mild monotypic plasmacytosis 1% on aspirate and 5% by IHC.  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  Has some baseline fatigue but otherwise denies other complaints at this time.  ECOG PS- 1 Pain scale- 0   Review of systems- Review of Systems  Constitutional: Positive for malaise/fatigue. Negative for chills, fever and  weight loss.  HENT: Negative for congestion, ear discharge and nosebleeds.   Eyes: Negative for blurred vision.  Respiratory: Negative for cough, hemoptysis, sputum production, shortness of breath and wheezing.   Cardiovascular: Negative for chest pain, palpitations, orthopnea and claudication.  Gastrointestinal: Negative for abdominal pain, blood in stool, constipation, diarrhea, heartburn, melena, nausea and vomiting.  Genitourinary: Negative for dysuria, flank pain, frequency, hematuria and urgency.  Musculoskeletal: Negative for back pain, joint pain and myalgias.  Skin: Negative for rash.  Neurological: Negative for dizziness, tingling, focal weakness, seizures, weakness and headaches.  Endo/Heme/Allergies: Does not bruise/bleed easily.  Psychiatric/Behavioral: Negative for depression and suicidal ideas. The patient does not have insomnia.       Allergies  Allergen Reactions  . Penicillins Anaphylaxis, Hives and Other (See Comments)  . Sulfa Antibiotics Anaphylaxis, Hives and Other (See Comments)  . Ezetimibe Other (See Comments)  . Rosuvastatin Other (See Comments)  . Statins     Other reaction(s): Other (See Comments) Confusion and mania      Past Medical History:  Diagnosis Date  . Allergic rhinitis   . Allergy   . Barrett's esophagus   . Cataract cortical, senile   . 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 Epilepsy ([place])   . GERD (gastroesophageal reflux disease)   . Glaucoma   . Hyperlipidemia   . Hypertension   . Insomnia   . MGUS (monoclonal gammopathy of unknown significance)   . Myocardial infarction ([place])   . Nephrolithiasis   . Osteoporosis   .  S/P triple vessel bypass   . Sleep apnea   . 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 ANKLE SURGERY    . broken leg    . CARDIAC SURGERY    . CORONARY ARTERY BYPASS GRAFT     triple  .  FRACTURE SURGERY     Fibula and Tibia. Plates and screws installed  . TONSILLECTOMY    . triple bypass    . VASECTOMY      Social History   Socioeconomic History  . Marital status: Married    Spouse name: Not on file  . Number of children: Not on file  . Years of education: Not on file  . Highest education level: Not on file  Occupational History  . Not on file  Tobacco Use  . Smoking status: Former Smoker    Types: 41, Pipe    Quit date: 10/29/2007    Years since quitting: 13.3  . Smokeless tobacco: Never Used  Vaping Use  . Vaping Use: Never used  Substance and Sexual Activity  . Alcohol use: Yes    Alcohol/week: 7.0 standard drinks    Types: 7 Shots of liquor per week    Comment: daily  . Drug use: Never  . Sexual activity: Not Currently  Other Topics Concern  . Not on file  Social History Narrative  . Not on file   Social Determinants of Health   Financial Resource Strain: Not on file  Food Insecurity: Not on file  Transportation Needs: Not on file  Physical Activity: Not on file  Stress: Not on file  Social Connections: Not on file  Intimate Partner Violence: Not on file    Family History  Problem Relation Age of Onset  . Seizures Mother   . Kidney cancer Father      Current Outpatient Medications:  .  azelastine (ASTELIN) 0.1 % nasal spray, Place 1 spray into the nose daily. , Disp: , Rfl:  .  hydrochlorothiazide (HYDRODIURIL) 25 MG tablet, Take 25 mg by mouth daily. , Disp: , Rfl:  .  latanoprost (XALATAN) 0.005 % ophthalmic solution, Place 1 drop into both eyes at bedtime. , Disp: , Rfl:  .  levETIRAcetam (KEPPRA XR) 500 MG 24 hr tablet, Take 2,000 mg by mouth daily. , Disp: , Rfl:  .  Loratadine 10 MG CAPS, Take 10 mg by mouth daily. , Disp: , Rfl:  .  losartan (COZAAR) 100 MG tablet, Take 100 mg by mouth daily. , Disp: , Rfl:  .  montelukast (SINGULAIR) 10 MG tablet, Take 10 mg by mouth daily. , Disp: , Rfl:  .  pantoprazole (PROTONIX) 40 MG  tablet, Take 40 mg by mouth daily. , Disp: , Rfl:  .  SYSTANE COMPLETE 0.6 % SOLN, Apply 1 drop to eye at bedtime., Disp: , Rfl:  .  clindamycin (CLEOCIN) 150 MG capsule, as needed For dental PRN (Patient not taking: Reported on 02/13/2021), Disp: , Rfl:  .  VASCEPA 0.5 g CAPS, Take 2 capsules by mouth in the morning and at bedtime., Disp: , Rfl:   Physical exam:  Vitals:   02/13/21 1124  BP: (S) (!) 150/82  Pulse: 70  Resp: 16  Temp: 99 F (37.2 C)  TempSrc: Oral  Weight: 173 lb 8 oz (78.7 kg)  Height: 5' 9"  (1.753 m)   Physical Exam Constitutional:      General:  He is not in acute distress. Cardiovascular:     Rate and Rhythm: Normal rate and regular rhythm.     Heart sounds: Normal heart sounds.  Pulmonary:     Effort: Pulmonary effort is normal.     Breath sounds: Normal breath sounds.  Abdominal:     General: Bowel sounds are normal.     Palpations: Abdomen is soft.  Skin:    General: Skin is warm and dry.  Neurological:     Mental Status: He is alert and oriented to person, place, and time.      CMP Latest Ref Rng & Units 02/08/2021  Glucose 70 - 99 mg/dL 116(H)  BUN 8 - 23 mg/dL 32(H)  Creatinine 0.61 - 1.24 mg/dL 0.93  Sodium 135 - 145 mmol/L 140  Potassium 3.5 - 5.1 mmol/L 3.9  Chloride 98 - 111 mmol/L 103  CO2 22 - 32 mmol/L 27  Calcium 8.9 - 10.3 mg/dL 9.1  Total Protein 6.5 - 8.1 g/dL 7.4  Total Bilirubin 0.3 - 1.2 mg/dL 1.2  Alkaline Phos 38 - 126 U/L 58  AST 15 - 41 U/L 19  ALT 0 - 44 U/L 18   CBC Latest Ref Rng & Units 02/08/2021  WBC 4.0 - 10.5 K/uL 6.0  Hemoglobin 13.0 - 17.0 g/dL 16.0  Hematocrit 39.0 - 52.0 % 45.7  Platelets 150 - 400 K/uL 144(L)      Assessment and plan- Patient is a 80 y.o. male with IgM kappa MGUS here for routine follow-up  Myeloma panels are presently pending.  His free light chain ratio has remained stable fluctuating between 5-9 over the last 2 years.  Bone marrow biopsy in the past did not show any overt  progression to multiple myeloma.  No evidence of anemia or hypercalcemia or CKD.  He has mild baseline thrombocytopenia which is essentially stable.  Overall there presently appears to be no concern for progression to overt multiple myeloma.  Repeat CBC with differential CMP myeloma panel and serum free light chains in 6 months in 1 year and I will see him back in 1 year   Visit Diagnosis 1. MGUS (monoclonal gammopathy of unknown significance)      Dr. Randa Evens, MD, MPH [facility] at [facility] [phone] 02/13/2021 1:11 PM

## 2021-02-13 NOTE — Progress Notes (Signed)
Pt has no concerns today. He hopes that he will not need to come back for 1 year

## 2021-02-20 ENCOUNTER — Ambulatory Visit (INDEPENDENT_AMBULATORY_CARE_PROVIDER_SITE_OTHER): Payer: Medicare Other | Admitting: Dermatology

## 2021-02-20 ENCOUNTER — Other Ambulatory Visit: Payer: Self-pay

## 2021-02-20 DIAGNOSIS — L821 Other seborrheic keratosis: Secondary | ICD-10-CM

## 2021-02-20 DIAGNOSIS — L820 Inflamed seborrheic keratosis: Secondary | ICD-10-CM | POA: Diagnosis not present

## 2021-02-20 DIAGNOSIS — L578 Other skin changes due to chronic exposure to nonionizing radiation: Secondary | ICD-10-CM

## 2021-02-20 DIAGNOSIS — L570 Actinic keratosis: Secondary | ICD-10-CM | POA: Diagnosis not present

## 2021-02-20 DIAGNOSIS — L219 Seborrheic dermatitis, unspecified: Secondary | ICD-10-CM

## 2021-02-20 NOTE — Patient Instructions (Signed)

## 2021-02-20 NOTE — Progress Notes (Signed)
   Follow-Up Visit   Subjective  Richard Summers is a 80 y.o. male who presents for the following: Actinic Keratosis (Of the scalp x 5 - patient thinks that lesions have resolved because he hasn't felt anything scaly on his scalp).  The following portions of the chart were reviewed this encounter and updated as appropriate:   Tobacco  Allergies  Meds  Problems  Med Hx  Surg Hx  Fam Hx     Review of Systems:  No other skin or systemic complaints except as noted in HPI or Assessment and Plan.  Objective  Well appearing patient in no apparent distress; mood and affect are within normal limits.  A focused examination was performed including the scalp. Relevant physical exam findings are noted in the Assessment and Plan.  Objective  Face and scalp: Pink patches with greasy scale.   Objective  Scalp x 1: Erythematous thin papules/macules with gritty scale.   Objective  Scalp x 2: Erythematous keratotic or waxy stuck-on papule or plaque.   Assessment & Plan  Seborrheic dermatitis Face and scalp  Seborrheic Dermatitis  -  is a chronic persistent rash characterized by pinkness and scaling most commonly of the mid face but also can occur on the scalp (dandruff), ears; mid chest and mid back. It tends to be exacerbated by stress and cooler weather.  People who have neurologic disease may experience new onset or exacerbation of existing seborrheic dermatitis.  The condition is not curable but treatable and can be controlled.  Not bothersome to patient - he declines tx today. May use ketoconazole cream alternating with hydrocortisone 2 and half percent cream in the future if desired.  AK (actinic keratosis) Scalp x 1  Destruction of lesion - Scalp x 1 Complexity: simple   Destruction method: cryotherapy   Informed consent: discussed and consent obtained   Timeout:  patient name, date of birth, surgical site, and procedure verified Lesion destroyed using liquid nitrogen: Yes    Region frozen until ice ball extended beyond lesion: Yes   Outcome: patient tolerated procedure well with no complications   Post-procedure details: wound care instructions given    Inflamed seborrheic keratosis Scalp x 2  Destruction of lesion - Scalp x 2 Complexity: simple   Destruction method: cryotherapy   Informed consent: discussed and consent obtained   Timeout:  patient name, date of birth, surgical site, and procedure verified Lesion destroyed using liquid nitrogen: Yes   Region frozen until ice ball extended beyond lesion: Yes   Outcome: patient tolerated procedure well with no complications   Post-procedure details: wound care instructions given     Actinic Damage - chronic, secondary to cumulative UV radiation exposure/sun exposure over time - diffuse scaly erythematous macules with underlying dyspigmentation - Recommend daily broad spectrum sunscreen SPF 30+ to sun-exposed areas, reapply every 2 hours as needed.  - Recommend staying in the shade or wearing long sleeves, sun glasses (UVA+UVB protection) and wide brim hats (4-inch brim around the entire circumference of the hat). - Call for new or changing lesions.  Seborrheic Keratoses - Stuck-on, waxy, tan-brown papules and/or plaques  - Benign-appearing - Discussed benign etiology and prognosis. - Observe - Call for any changes  Return in about 1 year (around 02/20/2022).  [name], CMA, am acting as scribe for Sarina Ser, MD .  Documentation: I have reviewed the above documentation for accuracy and completeness, and I agree with the above.  Sarina Ser, MD

## 2021-02-25 ENCOUNTER — Encounter: Payer: Self-pay | Admitting: Dermatology

## 2021-03-31 ENCOUNTER — Other Ambulatory Visit: Payer: Self-pay | Admitting: Neurology

## 2021-03-31 ENCOUNTER — Other Ambulatory Visit (HOSPITAL_COMMUNITY): Payer: Self-pay | Admitting: Neurology

## 2021-03-31 DIAGNOSIS — R413 Other amnesia: Secondary | ICD-10-CM

## 2021-03-31 DIAGNOSIS — M6281 Muscle weakness (generalized): Secondary | ICD-10-CM

## 2021-04-11 ENCOUNTER — Emergency Department (HOSPITAL_COMMUNITY): Payer: Medicare Other

## 2021-04-11 ENCOUNTER — Encounter (HOSPITAL_COMMUNITY): Payer: Self-pay | Admitting: Neurology

## 2021-04-11 ENCOUNTER — Other Ambulatory Visit: Payer: Self-pay

## 2021-04-11 ENCOUNTER — Inpatient Hospital Stay (HOSPITAL_COMMUNITY)
Admission: EM | Admit: 2021-04-11 | Discharge: 2021-05-01 | DRG: 064 | Disposition: E | Payer: Medicare Other | Attending: Neurology | Admitting: Neurology

## 2021-04-11 DIAGNOSIS — J9611 Chronic respiratory failure with hypoxia: Secondary | ICD-10-CM | POA: Diagnosis not present

## 2021-04-11 DIAGNOSIS — N179 Acute kidney failure, unspecified: Secondary | ICD-10-CM | POA: Diagnosis present

## 2021-04-11 DIAGNOSIS — R414 Neurologic neglect syndrome: Secondary | ICD-10-CM | POA: Diagnosis present

## 2021-04-11 DIAGNOSIS — I340 Nonrheumatic mitral (valve) insufficiency: Secondary | ICD-10-CM | POA: Diagnosis present

## 2021-04-11 DIAGNOSIS — G935 Compression of brain: Secondary | ICD-10-CM | POA: Diagnosis not present

## 2021-04-11 DIAGNOSIS — Z6826 Body mass index (BMI) 26.0-26.9, adult: Secondary | ICD-10-CM

## 2021-04-11 DIAGNOSIS — E876 Hypokalemia: Secondary | ICD-10-CM | POA: Diagnosis present

## 2021-04-11 DIAGNOSIS — R739 Hyperglycemia, unspecified: Secondary | ICD-10-CM | POA: Diagnosis present

## 2021-04-11 DIAGNOSIS — I25810 Atherosclerosis of coronary artery bypass graft(s) without angina pectoris: Secondary | ICD-10-CM | POA: Diagnosis present

## 2021-04-11 DIAGNOSIS — I480 Paroxysmal atrial fibrillation: Secondary | ICD-10-CM | POA: Diagnosis not present

## 2021-04-11 DIAGNOSIS — Z888 Allergy status to other drugs, medicaments and biological substances status: Secondary | ICD-10-CM

## 2021-04-11 DIAGNOSIS — E878 Other disorders of electrolyte and fluid balance, not elsewhere classified: Secondary | ICD-10-CM | POA: Diagnosis present

## 2021-04-11 DIAGNOSIS — R1312 Dysphagia, oropharyngeal phase: Secondary | ICD-10-CM | POA: Diagnosis present

## 2021-04-11 DIAGNOSIS — I610 Nontraumatic intracerebral hemorrhage in hemisphere, subcortical: Secondary | ICD-10-CM | POA: Diagnosis not present

## 2021-04-11 DIAGNOSIS — I10 Essential (primary) hypertension: Secondary | ICD-10-CM

## 2021-04-11 DIAGNOSIS — G8194 Hemiplegia, unspecified affecting left nondominant side: Secondary | ICD-10-CM | POA: Diagnosis present

## 2021-04-11 DIAGNOSIS — H409 Unspecified glaucoma: Secondary | ICD-10-CM | POA: Diagnosis present

## 2021-04-11 DIAGNOSIS — Z87898 Personal history of other specified conditions: Secondary | ICD-10-CM | POA: Diagnosis not present

## 2021-04-11 DIAGNOSIS — E669 Obesity, unspecified: Secondary | ICD-10-CM | POA: Diagnosis present

## 2021-04-11 DIAGNOSIS — H518 Other specified disorders of binocular movement: Secondary | ICD-10-CM | POA: Diagnosis present

## 2021-04-11 DIAGNOSIS — G931 Anoxic brain damage, not elsewhere classified: Secondary | ICD-10-CM | POA: Diagnosis present

## 2021-04-11 DIAGNOSIS — J9601 Acute respiratory failure with hypoxia: Secondary | ICD-10-CM | POA: Diagnosis present

## 2021-04-11 DIAGNOSIS — R6521 Severe sepsis with septic shock: Secondary | ICD-10-CM | POA: Diagnosis not present

## 2021-04-11 DIAGNOSIS — Z79899 Other long term (current) drug therapy: Secondary | ICD-10-CM

## 2021-04-11 DIAGNOSIS — R29716 NIHSS score 16: Secondary | ICD-10-CM | POA: Diagnosis present

## 2021-04-11 DIAGNOSIS — Z87891 Personal history of nicotine dependence: Secondary | ICD-10-CM

## 2021-04-11 DIAGNOSIS — A419 Sepsis, unspecified organism: Secondary | ICD-10-CM | POA: Diagnosis not present

## 2021-04-11 DIAGNOSIS — I255 Ischemic cardiomyopathy: Secondary | ICD-10-CM | POA: Diagnosis present

## 2021-04-11 DIAGNOSIS — I161 Hypertensive emergency: Secondary | ICD-10-CM | POA: Diagnosis present

## 2021-04-11 DIAGNOSIS — G4733 Obstructive sleep apnea (adult) (pediatric): Secondary | ICD-10-CM

## 2021-04-11 DIAGNOSIS — K219 Gastro-esophageal reflux disease without esophagitis: Secondary | ICD-10-CM | POA: Diagnosis present

## 2021-04-11 DIAGNOSIS — Z66 Do not resuscitate: Secondary | ICD-10-CM | POA: Diagnosis present

## 2021-04-11 DIAGNOSIS — R0609 Other forms of dyspnea: Secondary | ICD-10-CM | POA: Diagnosis not present

## 2021-04-11 DIAGNOSIS — E440 Moderate protein-calorie malnutrition: Secondary | ICD-10-CM | POA: Insufficient documentation

## 2021-04-11 DIAGNOSIS — E7800 Pure hypercholesterolemia, unspecified: Secondary | ICD-10-CM | POA: Diagnosis not present

## 2021-04-11 DIAGNOSIS — Z515 Encounter for palliative care: Secondary | ICD-10-CM | POA: Diagnosis not present

## 2021-04-11 DIAGNOSIS — G936 Cerebral edema: Secondary | ICD-10-CM | POA: Diagnosis present

## 2021-04-11 DIAGNOSIS — I5022 Chronic systolic (congestive) heart failure: Secondary | ICD-10-CM | POA: Diagnosis present

## 2021-04-11 DIAGNOSIS — I619 Nontraumatic intracerebral hemorrhage, unspecified: Secondary | ICD-10-CM

## 2021-04-11 DIAGNOSIS — K2270 Barrett's esophagus without dysplasia: Secondary | ICD-10-CM | POA: Diagnosis present

## 2021-04-11 DIAGNOSIS — Z7189 Other specified counseling: Secondary | ICD-10-CM | POA: Diagnosis not present

## 2021-04-11 DIAGNOSIS — Z978 Presence of other specified devices: Secondary | ICD-10-CM

## 2021-04-11 DIAGNOSIS — R29810 Facial weakness: Secondary | ICD-10-CM | POA: Diagnosis present

## 2021-04-11 DIAGNOSIS — G40909 Epilepsy, unspecified, not intractable, without status epilepticus: Secondary | ICD-10-CM | POA: Diagnosis present

## 2021-04-11 DIAGNOSIS — J690 Pneumonitis due to inhalation of food and vomit: Secondary | ICD-10-CM | POA: Diagnosis not present

## 2021-04-11 DIAGNOSIS — R471 Dysarthria and anarthria: Secondary | ICD-10-CM | POA: Diagnosis present

## 2021-04-11 DIAGNOSIS — J309 Allergic rhinitis, unspecified: Secondary | ICD-10-CM | POA: Diagnosis present

## 2021-04-11 DIAGNOSIS — R4701 Aphasia: Secondary | ICD-10-CM | POA: Diagnosis present

## 2021-04-11 DIAGNOSIS — I4819 Other persistent atrial fibrillation: Secondary | ICD-10-CM | POA: Diagnosis present

## 2021-04-11 DIAGNOSIS — Z9989 Dependence on other enabling machines and devices: Secondary | ICD-10-CM

## 2021-04-11 DIAGNOSIS — G40409 Other generalized epilepsy and epileptic syndromes, not intractable, without status epilepticus: Secondary | ICD-10-CM | POA: Diagnosis present

## 2021-04-11 DIAGNOSIS — I110 Hypertensive heart disease with heart failure: Secondary | ICD-10-CM | POA: Diagnosis present

## 2021-04-11 DIAGNOSIS — I6389 Other cerebral infarction: Secondary | ICD-10-CM | POA: Diagnosis not present

## 2021-04-11 DIAGNOSIS — R339 Retention of urine, unspecified: Secondary | ICD-10-CM | POA: Diagnosis present

## 2021-04-11 DIAGNOSIS — M810 Age-related osteoporosis without current pathological fracture: Secondary | ICD-10-CM | POA: Diagnosis present

## 2021-04-11 DIAGNOSIS — E872 Acidosis: Secondary | ICD-10-CM | POA: Diagnosis present

## 2021-04-11 DIAGNOSIS — Z882 Allergy status to sulfonamides status: Secondary | ICD-10-CM

## 2021-04-11 DIAGNOSIS — T17908A Unspecified foreign body in respiratory tract, part unspecified causing other injury, initial encounter: Secondary | ICD-10-CM | POA: Diagnosis not present

## 2021-04-11 DIAGNOSIS — E785 Hyperlipidemia, unspecified: Secondary | ICD-10-CM | POA: Diagnosis present

## 2021-04-11 DIAGNOSIS — I959 Hypotension, unspecified: Secondary | ICD-10-CM | POA: Diagnosis not present

## 2021-04-11 DIAGNOSIS — R001 Bradycardia, unspecified: Secondary | ICD-10-CM | POA: Diagnosis present

## 2021-04-11 DIAGNOSIS — W182XXA Fall in (into) shower or empty bathtub, initial encounter: Secondary | ICD-10-CM | POA: Diagnosis present

## 2021-04-11 DIAGNOSIS — Z20822 Contact with and (suspected) exposure to covid-19: Secondary | ICD-10-CM | POA: Diagnosis present

## 2021-04-11 DIAGNOSIS — R0902 Hypoxemia: Secondary | ICD-10-CM | POA: Diagnosis not present

## 2021-04-11 DIAGNOSIS — G4700 Insomnia, unspecified: Secondary | ICD-10-CM | POA: Diagnosis present

## 2021-04-11 DIAGNOSIS — Z85828 Personal history of other malignant neoplasm of skin: Secondary | ICD-10-CM

## 2021-04-11 DIAGNOSIS — Z880 Allergy status to penicillin: Secondary | ICD-10-CM

## 2021-04-11 DIAGNOSIS — J189 Pneumonia, unspecified organism: Secondary | ICD-10-CM

## 2021-04-11 DIAGNOSIS — D472 Monoclonal gammopathy: Secondary | ICD-10-CM | POA: Diagnosis present

## 2021-04-11 DIAGNOSIS — I252 Old myocardial infarction: Secondary | ICD-10-CM

## 2021-04-11 LAB — COMPREHENSIVE METABOLIC PANEL
ALT: 20 U/L (ref 0–44)
AST: 22 U/L (ref 15–41)
Albumin: 4.1 g/dL (ref 3.5–5.0)
Alkaline Phosphatase: 59 U/L (ref 38–126)
Anion gap: 11 (ref 5–15)
BUN: 27 mg/dL — ABNORMAL HIGH (ref 8–23)
CO2: 24 mmol/L (ref 22–32)
Calcium: 9.1 mg/dL (ref 8.9–10.3)
Chloride: 105 mmol/L (ref 98–111)
Creatinine, Ser: 1.02 mg/dL (ref 0.61–1.24)
GFR, Estimated: >60 mL/min (ref >60)
Glucose, Bld: 176 mg/dL — ABNORMAL HIGH (ref 70–99)
Potassium: 3.2 mmol/L — ABNORMAL LOW (ref 3.5–5.1)
Sodium: 140 mmol/L (ref 135–145)
Total Bilirubin: 1.8 mg/dL — ABNORMAL HIGH (ref 0.3–1.2)
Total Protein: 7 g/dL (ref 6.5–8.1)

## 2021-04-11 LAB — DIFFERENTIAL
Abs Immature Granulocytes: 0.04 10*3/uL (ref 0.00–0.07)
Basophils Absolute: 0 10*3/uL (ref 0.0–0.1)
Basophils Relative: 1 %
Eosinophils Absolute: 0.1 10*3/uL (ref 0.0–0.5)
Eosinophils Relative: 2 %
Immature Granulocytes: 1 %
Lymphocytes Relative: 8 %
Lymphs Abs: 0.6 10*3/uL — ABNORMAL LOW (ref 0.7–4.0)
Monocytes Absolute: 0.5 10*3/uL (ref 0.1–1.0)
Monocytes Relative: 7 %
Neutro Abs: 6.2 10*3/uL (ref 1.7–7.7)
Neutrophils Relative %: 81 %

## 2021-04-11 LAB — CBC
HCT: 47.4 % (ref 39.0–52.0)
Hemoglobin: 16 g/dL (ref 13.0–17.0)
MCH: 31.5 pg (ref 26.0–34.0)
MCHC: 33.8 g/dL (ref 30.0–36.0)
MCV: 93.3 fL (ref 80.0–100.0)
Platelets: 160 10*3/uL (ref 150–400)
RBC: 5.08 MIL/uL (ref 4.22–5.81)
RDW: 12.6 % (ref 11.5–15.5)
WBC: 7.5 10*3/uL (ref 4.0–10.5)
nRBC: 0 % (ref 0.0–0.2)

## 2021-04-11 LAB — I-STAT CHEM 8, ED
BUN: 29 mg/dL — ABNORMAL HIGH (ref 8–23)
Calcium, Ion: 1.11 mmol/L — ABNORMAL LOW (ref 1.15–1.40)
Chloride: 105 mmol/L (ref 98–111)
Creatinine, Ser: 0.9 mg/dL (ref 0.61–1.24)
Glucose, Bld: 173 mg/dL — ABNORMAL HIGH (ref 70–99)
HCT: 47 % (ref 39.0–52.0)
Hemoglobin: 16 g/dL (ref 13.0–17.0)
Potassium: 3.1 mmol/L — ABNORMAL LOW (ref 3.5–5.1)
Sodium: 141 mmol/L (ref 135–145)
TCO2: 23 mmol/L (ref 22–32)

## 2021-04-11 LAB — RESP PANEL BY RT-PCR (FLU A&B, COVID) ARPGX2
Influenza A by PCR: NEGATIVE
Influenza B by PCR: NEGATIVE
SARS Coronavirus 2 by RT PCR: NEGATIVE

## 2021-04-11 LAB — PROTIME-INR
INR: 1 (ref 0.8–1.2)
Prothrombin Time: 12.9 seconds (ref 11.4–15.2)

## 2021-04-11 LAB — CBG MONITORING, ED
Glucose-Capillary: 188 mg/dL — ABNORMAL HIGH (ref 70–99)
Glucose-Capillary: 132 mg/dL — ABNORMAL HIGH (ref 70–99)

## 2021-04-11 LAB — APTT: aPTT: 27 seconds (ref 24–36)

## 2021-04-11 MED ORDER — CLEVIDIPINE BUTYRATE 0.5 MG/ML IV EMUL
0.0000 mg/h | INTRAVENOUS | Status: DC
  Administered 2021-04-11: 1 mg/h via INTRAVENOUS
  Administered 2021-04-12: 10 mg/h via INTRAVENOUS
  Administered 2021-04-12: 2 mg/h via INTRAVENOUS
  Administered 2021-04-12: 11 mg/h via INTRAVENOUS
  Administered 2021-04-12: 12 mg/h via INTRAVENOUS
  Administered 2021-04-13: 19 mg/h via INTRAVENOUS
  Administered 2021-04-13: 11 mg/h via INTRAVENOUS
  Administered 2021-04-13: 18 mg/h via INTRAVENOUS
  Administered 2021-04-13: 15 mg/h via INTRAVENOUS
  Administered 2021-04-13: 21 mg/h via INTRAVENOUS
  Administered 2021-04-13: 11 mg/h via INTRAVENOUS
  Administered 2021-04-14: 8 mg/h via INTRAVENOUS
  Filled 2021-04-11 (×2): qty 50
  Filled 2021-04-11: qty 200
  Filled 2021-04-11: qty 50
  Filled 2021-04-11: qty 100
  Filled 2021-04-11: qty 50
  Filled 2021-04-11: qty 100
  Filled 2021-04-11: qty 50
  Filled 2021-04-11 (×2): qty 100
  Filled 2021-04-11: qty 200
  Filled 2021-04-11: qty 100
  Filled 2021-04-11: qty 200
  Filled 2021-04-11: qty 100

## 2021-04-11 MED ORDER — LABETALOL HCL 5 MG/ML IV SOLN
INTRAVENOUS | Status: AC
  Filled 2021-04-11: qty 4

## 2021-04-11 MED ORDER — PANTOPRAZOLE SODIUM 40 MG IV SOLR
40.0000 mg | Freq: Every day | INTRAVENOUS | Status: DC
  Administered 2021-04-11 – 2021-04-12 (×2): 40 mg via INTRAVENOUS
  Filled 2021-04-11 (×2): qty 40

## 2021-04-11 MED ORDER — STROKE: EARLY STAGES OF RECOVERY BOOK: Freq: Once | Status: AC

## 2021-04-11 MED ORDER — SENNOSIDES-DOCUSATE SODIUM 8.6-50 MG PO TABS
1.0000 | ORAL_TABLET | Freq: Two times a day (BID) | ORAL | Status: DC
  Administered 2021-04-12 – 2021-04-13 (×2): 1 via ORAL
  Filled 2021-04-11 (×3): qty 1

## 2021-04-11 MED ORDER — LEVETIRACETAM IN NACL 1000 MG/100ML IV SOLN
1000.0000 mg | Freq: Two times a day (BID) | INTRAVENOUS | Status: DC
  Administered 2021-04-11 – 2021-04-12 (×2): 1000 mg via INTRAVENOUS
  Filled 2021-04-11 (×2): qty 100

## 2021-04-11 MED ORDER — ACETAMINOPHEN 160 MG/5ML PO SOLN
650.0000 mg | ORAL | Status: DC | PRN
  Administered 2021-04-15 – 2021-04-18 (×10): 650 mg
  Filled 2021-04-11 (×10): qty 20.3

## 2021-04-11 MED ORDER — LOSARTAN POTASSIUM 50 MG PO TABS
100.0000 mg | ORAL_TABLET | Freq: Every day | ORAL | Status: DC
  Administered 2021-04-12 – 2021-04-13 (×2): 100 mg via ORAL
  Filled 2021-04-11 (×2): qty 2

## 2021-04-11 MED ORDER — ACETAMINOPHEN 325 MG PO TABS
650.0000 mg | ORAL_TABLET | ORAL | Status: DC | PRN
  Administered 2021-04-13: 650 mg via ORAL
  Filled 2021-04-11: qty 2

## 2021-04-11 MED ORDER — SODIUM CHLORIDE 0.9 % IV SOLN: INTRAVENOUS | Status: DC

## 2021-04-11 MED ORDER — POTASSIUM CHLORIDE 10 MEQ/100ML IV SOLN
10.0000 meq | INTRAVENOUS | Status: AC
  Administered 2021-04-11 – 2021-04-12 (×3): 10 meq via INTRAVENOUS
  Filled 2021-04-11 (×3): qty 100

## 2021-04-11 MED ORDER — SODIUM CHLORIDE 0.9% FLUSH
3.0000 mL | Freq: Once | INTRAVENOUS | Status: DC
Start: 2021-04-11 — End: 2021-04-20

## 2021-04-11 MED ORDER — ACETAMINOPHEN 650 MG RE SUPP
650.0000 mg | RECTAL | Status: DC | PRN
  Administered 2021-04-13: 650 mg via RECTAL
  Filled 2021-04-11: qty 1

## 2021-04-11 MED ORDER — INSULIN ASPART 100 UNIT/ML IJ SOLN
0.0000 [IU] | INTRAMUSCULAR | Status: DC
  Administered 2021-04-11 – 2021-04-12 (×7): 1 [IU] via SUBCUTANEOUS
  Administered 2021-04-13: 2 [IU] via SUBCUTANEOUS
  Administered 2021-04-13: 5 [IU] via SUBCUTANEOUS
  Administered 2021-04-13: 1 [IU] via SUBCUTANEOUS
  Administered 2021-04-13: 3 [IU] via SUBCUTANEOUS
  Administered 2021-04-13: 5 [IU] via SUBCUTANEOUS
  Administered 2021-04-14: 1 [IU] via SUBCUTANEOUS
  Administered 2021-04-14 (×2): 2 [IU] via SUBCUTANEOUS
  Administered 2021-04-14 (×2): 1 [IU] via SUBCUTANEOUS
  Administered 2021-04-15 (×3): 2 [IU] via SUBCUTANEOUS
  Administered 2021-04-15: 1 [IU] via SUBCUTANEOUS
  Administered 2021-04-15: 2 [IU] via SUBCUTANEOUS
  Administered 2021-04-16: 3 [IU] via SUBCUTANEOUS
  Administered 2021-04-16: 2 [IU] via SUBCUTANEOUS
  Administered 2021-04-16: 5 [IU] via SUBCUTANEOUS
  Administered 2021-04-16: 3 [IU] via SUBCUTANEOUS
  Administered 2021-04-16: 2 [IU] via SUBCUTANEOUS
  Administered 2021-04-17: 3 [IU] via SUBCUTANEOUS
  Administered 2021-04-17: 2 [IU] via SUBCUTANEOUS
  Administered 2021-04-17 (×4): 3 [IU] via SUBCUTANEOUS
  Administered 2021-04-18: 1 [IU] via SUBCUTANEOUS
  Administered 2021-04-18: 3 [IU] via SUBCUTANEOUS
  Administered 2021-04-18: 2 [IU] via SUBCUTANEOUS
  Administered 2021-04-18 (×2): 3 [IU] via SUBCUTANEOUS
  Administered 2021-04-18: 2 [IU] via SUBCUTANEOUS
  Administered 2021-04-19: 1 [IU] via SUBCUTANEOUS

## 2021-04-11 MED ORDER — LABETALOL HCL 5 MG/ML IV SOLN: 10.0000 mg | Freq: Once | INTRAVENOUS | Status: DC

## 2021-04-11 MED ORDER — HYDROCHLOROTHIAZIDE 25 MG PO TABS
25.0000 mg | ORAL_TABLET | Freq: Every day | ORAL | Status: DC
  Administered 2021-04-12 – 2021-04-13 (×2): 25 mg via ORAL
  Filled 2021-04-11 (×2): qty 1

## 2021-04-11 NOTE — ED Triage Notes (Signed)
1756 Patient to ED from home by EMS for evaluation of stroke like symptoms. Last known well time 1700. Patient is A/O on arrival with left side neglect.

## 2021-04-11 NOTE — ED Notes (Signed)
O2 at 2L/[place] applied for RA O2 sat 91%.

## 2021-04-11 NOTE — ED Notes (Signed)
Patient is resting with eyes closed but responds to voice. Speech is slurred but patient responds appropriately. Patient continues to cough and aspirate on his oral secretions, gurgling noted when patient talks. Cough is weaker than initially noted.

## 2021-04-11 NOTE — ED Provider Notes (Signed)
[place] EMERGENCY DEPARTMENT Provider Note   CSN: [id number] Arrival date & time: 04/03/2021  1754  An emergency department physician performed an initial assessment on this suspected stroke patient at 1756.  History Chief Complaint  Patient presents with   Altered Mental Status    Code stroke    Richard Summers is a 80 y.o. male.  Presenting to ER as code stroke.  History limited due to acuity, altered mental status.  Reportedly last known well was 5 PM.  Patient found to have profound left-sided neglect.  Is alert.  No prior history of stroke, not on blood thinners.  Level 5 caveat.  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   right crown scalp    Patient Active Problem List   Diagnosis Date Noted   ICH (intracerebral hemorrhage) ([place]) 04/20/2021   Chronic allergic rhinitis 12/18/2019   Essential hypertension 12/18/2019   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[place]) 12/18/2019   Barrett's esophagus 12/18/2019   Glaucoma 12/18/2019   Insomnia 12/18/2019   Mixed hypercholesterolemia and hypertriglyceridemia 12/18/2019   Osteoporosis 12/18/2019   MGUS (monoclonal gammopathy of unknown significance) 07/06/2019   Ischemic cardiomyopathy 04/27/2019   Coronary artery disease involving coronary bypass graft of native heart 03/18/2019   Spinal stenosis of lumbar region 07/25/2017   Pain in left foot 02/14/2017   OSA on CPAP 04/09/2015    Prediabetes 04/09/2015   MR (mitral regurgitation) 04/20/2014   Thrombocytopenia ([place]) 06/18/2013    Past Surgical History:  Procedure Laterality Date   ANKLE SURGERY     broken leg     CARDIAC SURGERY     CORONARY ARTERY BYPASS GRAFT     triple   FRACTURE SURGERY     Fibula and Tibia. Plates and screws installed   TONSILLECTOMY     triple bypass     VASECTOMY         Family History  Problem Relation Age of Onset   Seizures Mother    Kidney cancer Father     Social History   Tobacco Use   Smoking status: Former    Pack years: 0.00    Types: 19, Pipe    Quit date: 10/29/2007    Years since quitting: 13.4   Smokeless tobacco: Never  Vaping Use   Vaping Use: Never used  Substance Use Topics   Alcohol use: Yes    Alcohol/week: 7.0 standard drinks    Types: 7 Shots of liquor per week    Comment: daily   Drug use: Never    Home Medications Prior to Admission medications   Medication Sig Start Date End Date Taking? Authorizing Provider  azelastine (ASTELIN) 0.1 % nasal spray Place 1 spray into the nose daily.  03/30/19 02/13/21  [provider]  clindamycin (CLEOCIN) 150 MG capsule  03/28/17   [provider]  hydrochlorothiazide (HYDRODIURIL) 25 MG tablet Take 25 mg by mouth daily.  10/06/18   [provider]  latanoprost (XALATAN) 0.005 % ophthalmic solution Place 1 drop into both eyes at bedtime.  02/23/19   [provider]  levETIRAcetam (KEPPRA XR) 500 MG 24 hr tablet Take 2,000 mg by mouth daily.     [provider]  Loratadine 10 MG CAPS Take 10 mg by mouth daily.     [provider]  losartan (COZAAR) 100 MG tablet Take 100 mg by mouth daily.  02/09/19   [provider]  montelukast (SINGULAIR) 10 MG tablet Take 10 mg by mouth daily.  02/09/19   [provider]  pantoprazole (PROTONIX) 40 MG tablet Take 40 mg by mouth daily.  10/06/18   [provider]  SYSTANE COMPLETE 0.6 % SOLN  Apply 1 drop to eye at bedtime. 04/14/20   [provider]  VASCEPA 0.5 g CAPS Take 2 capsules by mouth in the morning and at bedtime. 06/04/20   [provider]    Allergies    Penicillins, Sulfa antibiotics, Ezetimibe, Rosuvastatin, and Statins  Review of Systems   Review of Systems  Unable to perform ROS: Acuity of condition   Physical Exam Updated Vital Signs BP (!) 144/84   Pulse 64   Temp 97.6 F (36.4 C) (Oral)   Resp 16   Ht 5\' 6"  (1.676 m)   Wt 74.8 kg   SpO2 94%   BMI 26.62 kg/m   Physical Exam Constitutional:      Comments: Altered, right gaze preference, left-sided neglect  HENT:     Head: Normocephalic.     Mouth/Throat:     Mouth: Mucous membranes are moist.  Eyes:     Pupils: Pupils are equal, round, and reactive to light.     Comments: There is right gaze preference  Cardiovascular:     Rate and Rhythm: Normal rate.     Pulses: Normal pulses.  Pulmonary:     Effort: Pulmonary effort is normal. No respiratory distress.  Abdominal:     General: Abdomen is flat.     Palpations: Abdomen is soft.  Musculoskeletal:        General: No deformity or signs of injury.  Skin:    General: Skin is warm and dry.  Neurological:     Comments: Patient is alert, somewhat confused, obvious left-sided neglect, significant weakness in left arm, left leg, does have a right gaze preference    ED Results / Procedures / Treatments   Labs (all labs ordered are listed, but only abnormal results are displayed) Labs Reviewed  DIFFERENTIAL - Abnormal; Notable for the following components:      Result Value   Lymphs Abs 0.6 (*)    All other components within normal limits  COMPREHENSIVE METABOLIC PANEL - Abnormal; Notable for the following components:   Potassium 3.2 (*)    Glucose, Bld 176 (*)    BUN 27 (*)    Total Bilirubin 1.8 (*)    All other components within normal limits  I-STAT CHEM 8, ED - Abnormal; Notable for the following components:    Potassium 3.1 (*)    BUN 29 (*)    Glucose, Bld 173 (*)    Calcium, Ion 1.11 (*)    All other components within normal limits  CBG MONITORING, ED - Abnormal; Notable for the following components:   Glucose-Capillary 188 (*)    All other components within normal limits  PROTIME-INR  APTT  CBC    EKG EKG Interpretation  Date/Time:  Tuesday April 11 2021 18:12:25 EDT Ventricular Rate:  76 PR Interval:    QRS Duration: 132 QT Interval:  439 QTC Calculation: 494 R Axis:   12 Text Interpretation: Atrial fibrillation LVH with secondary repolarization abnormality Inferior infarct, old Anterior Q waves, possibly due to LVH Confirmed by Madalyn Rob [phone]) on 04/08/2021 8:13:56 PM  Radiology CT HEAD CODE STROKE WO CONTRAST  Result Date: 04/08/2021 CLINICAL DATA:  Code stroke. EXAM: CT HEAD WITHOUT CONTRAST TECHNIQUE: Contiguous axial images were obtained from the base of the skull through the vertex without intravenous contrast. COMPARISON:  None. FINDINGS: Brain: Acute parenchymal hemorrhage is present centered in the right basal ganglia region with extension into the corona radiata superiorly and temporal lobe inferiorly. Mild surrounding edema. Regional mass effect is present with partial effacement of the right lateral ventricle. No substantial midline shift. No intraventricular extension. No hydrocephalus. Gray-white differentiation is preserved. 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 Additional patchy low-attenuation in the supratentorial white matter probably reflects chronic microvascular ischemic changes. Vascular: There is intracranial atherosclerotic calcification at the skull base. Skull: Unremarkable. Sinuses/Orbits: Ethmoid dominant mucosal thickening. Orbits are unremarkable. Other: Mastoid air cells are clear. IMPRESSION: Acute parenchymal hemorrhage centered in the right basal ganglia region with mild edema and regional mass effect. No intraventricular extension.  These results were communicated to Dr. Curly Shores at 6:11 pm on 04/30/2021 by text page via the [facility] messaging system. Electronically Signed   By: Macy Mis M.D.   On: 04/30/2021 18:15    Procedures .Critical Care  Date/Time: 03/31/2021 8:13 PM Performed by: Lucrezia Starch, MD Authorized by: Lucrezia Starch, MD   Critical care provider statement:    Critical care time (minutes):  49   Critical care was necessary to treat or prevent imminent or life-threatening deterioration of the following conditions:  CNS failure or compromise   Critical care was time spent personally by me on the following activities:  Discussions with consultants, evaluation of patient's response to treatment, examination of patient, ordering and performing treatments and interventions, ordering and review of laboratory studies, ordering and review of radiographic studies, pulse oximetry, re-evaluation of patient's condition, obtaining history from patient or surrogate and review of old charts   Medications Ordered in ED Medications  sodium chloride flush (NS) 0.9 % injection 3 mL (has no administration in time range)  labetalol (NORMODYNE) 5 MG/ML injection (has no administration in time range)  clevidipine (CLEVIPREX) infusion 0.5 mg/mL (1 mg/hr Intravenous New Bag/Given 04/29/2021 1857)    ED Course  I have reviewed the triage vital signs and the nursing notes.  Pertinent labs & imaging results that were available during my care of the patient were reviewed by me and considered in my medical decision making (see chart for details).    MDM Rules/Calculators/A&P                          80 year old male presented as a stroke alert.  Concern for left-sided weakness.  On arrival to ER patient was alert, following commands, answering some basic questions but confused.  Obvious left-sided neglect, left-sided weakness in arm and leg.  CT concerning for acute parenchymal hemorrhage in the right basal ganglia with  mild edema and regional mass-effect.  Dr. Parke Simmers will admit patient.  Given labetalol for BP control.  At present patient is able to protect airway.  Will be admitted to ICU.  Final Clinical Impression(s) / ED Diagnoses Final diagnoses:  Hemorrhagic stroke [facility])    Rx / DC Orders ED Discharge Orders     None        Lucrezia Starch, MD 04/25/2021 2014

## 2021-04-11 NOTE — H&P (Signed)
Neurology H&P  CC: Left sided weakness  History is obtained from: EMS and chart review, wife able to provide limited history   HPI: Richard Summers is a 80 y.o. 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  Reportedly he had cooked dinner for his wife around 5 PM.  She then heard him fall while he was taking a shower and activated EMS who found him cold and wet, half out of the shower, unable to move his left side, unable to recognize his left side, unable to see on the left, unaware that he was having any problems.  Code stroke was activated and head CT on arrival showed hemorrhage.  EMS noted blood pressures in the 130s over 70s, heart rates in the 70s, satting 95% on room air.  Updating the family is fairly difficult.  Wife reported that she is incapacitated and immediately handed the phone to a neighbor who explained that the patient is cared for by her husband due to on oxygen requirement and significant physical debility although she reported that mentally the patient is intact as far as she is aware.  Due to arrival of another code stroke patient, discussion was brief but wife confirmed the patient's CODE STATUS was full code  LKW: 5 PM tPA given?: No, due to [place] Premorbid modified rankin scale: 0-1     0 - No symptoms.     1 - No significant disability. Able to carry out all usual activities, despite some symptoms.  ICH Score: 1  Time performed: 6:15 PM GCS: 13-15 is 0 points Infratentorial: No.. If yes, 1 point -- 0  Volume: >30cc is 1 point -- 1 Age: 80 y.o.. >80 is 1 point -- 0  Intraventricular extension is 1 point -- 0   A Score of 1 points has a 30 day mortality of 13%. Stroke. 2001 Apr;32(4):891-7.  ROS: Unable to obtain due to altered mental status.   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 Plates and screws installed   TONSILLECTOMY     triple bypass     VASECTOMY       Family History  Problem Relation Age of Onset   Seizures Mother    Kidney cancer Father    Social History:  reports that he quit smoking about 13 years ago. His smoking use included cigars and pipe. He has never used smokeless tobacco. He reports current alcohol use of about 7.0 standard drinks of  alcohol per week. He reports that he does not use drugs.   Exam: Current vital signs: BP 130/78   Pulse 68   Temp 97.6 F (36.4 C) (Oral)   Resp 18   Ht 5\' 6"  (1.676 m)   Wt 74.8 kg   SpO2 91%   BMI 26.62 kg/m  Vital signs in last 24 hours: Temp:  [97.6 F (36.4 C)] 97.6 F (36.4 C) (07/12 1821) Pulse Rate:  [60-68] 68 (07/12 1946) Resp:  [16-24] 18 (07/12 1946) BP: (130-152)/(78-89) 130/78 (07/12 1946) SpO2:  [91 %-99 %] 91 % (07/12 1946) Weight:  [74.8 kg] 74.8 kg (07/12 1821)   Physical Exam  Constitutional: Appears well-developed and well-nourished.  Psych: Affect calm, inappropriately unconcerned Eyes: No scleral injection HENT: No oropharyngeal obstruction.  MSK: no joint deformities.  Cardiovascular: Irregularly irregular Respiratory: Effort normal, non-labored breathing, though he  has a wet cough GI: Soft.  No distension. There is no tenderness.  Skin: Warm dry and intact visible skin  Neuro: Mental Status: Patient is awake, alert, oriented to person, place, reports the month is June and that he is 80 years old initially (later correctly reports that he is 24) Left-sided neglect that is profound (does not recognize his own hand) Cranial Nerves: II: Visual Fields are notable for a left hemianopia. Pupils are equal, round, and reactive to light, but intermittently left pupil is slightly larger than the right III,IV, VI: Right gaze preference, cannot cross past midline to the left V: Facial sensation is symmetric to eyelash brush VII: Facial movement is notable for a left lower facial droop VIII: hearing is intact to voice XII: tongue is midline without atrophy or fasciculations.  Sensory/motor: Tone is low on the left side.  Triple flexion of the left lower extremity, withdrawal of left upper extremity.  No drift of the right upper extremity or right leg on NIH stroke scale testing.  Patient is markedly less responsive to stimulation on the left than the right Plantar reflexes: Toes are downgoing the right and upgoing on the left Cerebellar: FNF and HKS are intact on the right  NIHSS total 16 Score breakdown: 2 points for incorrectly answering age and month, one-point for gaze deviation to the right, 2 points for complete hemianopia on the left, 2 points for left facial droop, 3 points for left arm weakness, 3 points for left leg weakness, 1 points for sensory loss on the left, one-point for mild aphasia (some mild difficulty with repetition), one-point for mild dysarthria, 2 points for complete neglect of the left side  I have reviewed labs in epic and the results pertinent to this consultation are: Creatinine 1.02, glucose 173, BUN mildly increased to 27, CBC within normal limits, PT INR and PTT within normal limits Mild hypokalemia to 3.2  Basic Metabolic  Panel: Recent Labs  Lab 04/24/2021 1756 04/20/2021 1804  NA 140 141  K 3.2* 3.1*  CL 105 105  CO2 24  --   GLUCOSE 176* 173*  BUN 27* 29*  CREATININE 1.02 0.90  CALCIUM 9.1  --     CBC: Recent Labs  Lab 04/03/2021 1756 04/17/2021 1804  WBC 7.5  --   NEUTROABS 6.2  --   HGB 16.0 16.0  HCT 47.4 47.0  MCV 93.3  --   PLT 160  --     Coagulation Studies: Recent Labs    04/29/2021 1756  LABPROT 12.9  INR 1.0    No results found for: HGBA1C  No results  found for: CHOL, HDL, LDLCALC, LDLDIRECT, TRIG, CHOLHDL    EKG personally reviewed, agree with the ED attending read of atrial fibrillation  I have reviewed the images obtained:  Head CT with acute intraparenchymal hemorrhage centered in the right basal ganglia without intraventricular extension, approximately 45 cc in size, mild mass-effect  Impression: Acute intraparenchymal hemorrhage in a location typical for hypertensive hemorrhage.  However given new onset atrial fibrillation possibility of hemorrhagic conversion of an ischemic stroke could be considered as well   Plan: Subcortical ICH, nontraumatic Acuity: Acute Laterality: Right basal ganglia Current suspected etiology: Hypertensive hemorrhage Treatment: Blood pressure control, no reversal indicated given patient not on anticoagulation -Admit to neuro ICU, stroke team -ICH Score: 1 -ICH Volume: Approximately 40-45 cc -BP control goal SYS<140 -PT/OT/ST  -neuromonitoring  CNS Cerebral edema -Monitor sodium, if there is a decline in examination or worsening symptoms hypertonic's would be indicated -Close neuro monitoring  Dysarthria Dysphagia following ICH  -NPO until cleared by speech -ST -Advance diet as tolerated -May need PEG -Chest x-ray 7/13 a.m.  Toxic encephalopathy Anoxic encephalopathy -Correct metabolic causes -Monitor  Hemiplegia and hemiparesis following nontraumatic intracerebral hemorrhage affecting left non-dominant side  -Continue  PT/OT/ST  RESP Currently protecting his airway but high risk for aspiration pneumonia given his wet cough -Continue to monitor carefully, may need intubation if his examination declines  CV  Essential (primary) hypertension -Aggressive BP control, goal SBP <140, Cleviprex ordered given patient intermittently bradycardic to the 50s labetalol should be avoided, have not ordered hydralazine secondary to national shortages with hospital supplies at a critical low level -Titrate oral agents, continuing home HCTZ and losartan timed to start tomorrow, but regimen may need to be adjusted once full medication reconciliation is completed  New onset atrial -fibrillation -Holding beta-blockers at this time given patient is intermittently bradycardic into the 50s -Repeat CT in 2 weeks for consideration of starting anticoagulation if CT is stable.  -Consider cardiology consult in the morning  GI/GU No active issues -Gentle hydration (sodium chloride at 50 cc an hour) -avoid nephrotoxic agents -renal consult  HEME No active issues  ENDO No known history of diabetes but patient slightly hyperglycemic on arrival (blood glucose 173) -SSI -Start oral meds -goal HgbA1c < 7  Fluid/Electrolyte Disorders Hypo Potassium  -Replete for potassium of 3.2 with 10 mEq IV potassium x3 doses -Repeat labs -Trend  ID Possible Aspiration PNA -CXR -NPO -Monitor  Nutrition E66.9 Obesity (BMI 26.6) -diet consult  Prophylaxis DVT: SCDs GI: Senna Bowel: Protonix  Dispo: Pending clinical stabilization Diet: NPO until cleared by speech  Code Status: Full Code   THE FOLLOWING WERE PRESENT ON ADMISSION: CNS -  Acute Ischemic Stroke, seizure history without active seizures, mild unspecified encephalopathy, Cerebral Edema, ICH, Hemiparesis,  Respiratory - Probable Aspiration Pneumonia, Cardiovascular -new onset atrial fibrillation Renal -hypokalemia Cancer -history of skin cancer  Lesleigh Noe  MD-PhD [facility] [phone]  Total critical care time: 45 minutes   Critical care time was exclusive of separately billable procedures and treating other patients.   Critical care was necessary to treat or prevent imminent or life-threatening deterioration.   Critical care was time spent personally by me on the following activities: development of treatment plan with patient and/or surrogate as well as nursing, discussions with consultants/primary team, evaluation of patient's response to treatment, examination of patient, obtaining history from patient or surrogate, ordering and performing treatments and interventions, ordering and review of laboratory studies, ordering and review of radiographic studies, and re-evaluation  of patient's condition as needed, as documented above.

## 2021-04-11 NOTE — ED Notes (Signed)
Patient gets choked on his oral secretions. Tolerates oral suction with [place]. Patient's speech continues to be slurred.

## 2021-04-11 NOTE — Code Documentation (Signed)
Pt is a 80 yr old man  with history of epilepsy HTN, MI s/p bypass, and OSA who was last known to be well tonight at 1700 per wife. He then got in the shower, and the wife heard him fall. She could not help him get up due to left sided weakness, so EMS was activated. Pt arrived [facility] at 1754. He was hemiplegic on left with a fixed rt gaze. (NIHSS 23, see flowsheet for times and details). He was cleared by EDP, blood drawn, CBG obtained at bridge. Pt was taken to CT at 1800. Per neurologist, NCCT was positive for a Right Basal Ganglia hemorrhage. Pt not candidate for IV thrombolytic as hemorrhage. Pt not elegible for NIR and hemorrhage. Pt returned to room 23. His BP will be kept under 140. He will need q 1 hr vital signs and neuro checks. Bedside handoff with Greta RN complete.

## 2021-04-12 ENCOUNTER — Encounter (HOSPITAL_COMMUNITY): Payer: Self-pay | Admitting: Neurology

## 2021-04-12 ENCOUNTER — Inpatient Hospital Stay (HOSPITAL_COMMUNITY): Payer: Medicare Other

## 2021-04-12 DIAGNOSIS — I480 Paroxysmal atrial fibrillation: Secondary | ICD-10-CM | POA: Diagnosis not present

## 2021-04-12 DIAGNOSIS — I610 Nontraumatic intracerebral hemorrhage in hemisphere, subcortical: Secondary | ICD-10-CM | POA: Diagnosis not present

## 2021-04-12 DIAGNOSIS — G936 Cerebral edema: Secondary | ICD-10-CM

## 2021-04-12 DIAGNOSIS — I6389 Other cerebral infarction: Secondary | ICD-10-CM

## 2021-04-12 DIAGNOSIS — I161 Hypertensive emergency: Secondary | ICD-10-CM

## 2021-04-12 DIAGNOSIS — Z87898 Personal history of other specified conditions: Secondary | ICD-10-CM

## 2021-04-12 LAB — URINALYSIS, ROUTINE W REFLEX MICROSCOPIC
Bilirubin Urine: NEGATIVE
Glucose, UA: NEGATIVE mg/dL
Hgb urine dipstick: NEGATIVE
Ketones, ur: 20 mg/dL — AB
Leukocytes,Ua: NEGATIVE
Nitrite: NEGATIVE
Protein, ur: NEGATIVE mg/dL
Specific Gravity, Urine: 1.019 (ref 1.005–1.030)
pH: 7 (ref 5.0–8.0)

## 2021-04-12 LAB — GLUCOSE, CAPILLARY
Glucose-Capillary: 132 mg/dL — ABNORMAL HIGH (ref 70–99)
Glucose-Capillary: 132 mg/dL — ABNORMAL HIGH (ref 70–99)
Glucose-Capillary: 150 mg/dL — ABNORMAL HIGH (ref 70–99)
Glucose-Capillary: 133 mg/dL — ABNORMAL HIGH (ref 70–99)
Glucose-Capillary: 133 mg/dL — ABNORMAL HIGH (ref 70–99)
Glucose-Capillary: 137 mg/dL — ABNORMAL HIGH (ref 70–99)
Glucose-Capillary: 143 mg/dL — ABNORMAL HIGH (ref 70–99)

## 2021-04-12 LAB — CBC
HCT: 47.7 % (ref 39.0–52.0)
Hemoglobin: 16.6 g/dL (ref 13.0–17.0)
MCH: 31.7 pg (ref 26.0–34.0)
MCHC: 34.8 g/dL (ref 30.0–36.0)
MCV: 91.2 fL (ref 80.0–100.0)
Platelets: 174 10*3/uL (ref 150–400)
RBC: 5.23 MIL/uL (ref 4.22–5.81)
RDW: 12.5 % (ref 11.5–15.5)
WBC: 8.5 10*3/uL (ref 4.0–10.5)
nRBC: 0 % (ref 0.0–0.2)

## 2021-04-12 LAB — BASIC METABOLIC PANEL
Anion gap: 10 (ref 5–15)
BUN: 15 mg/dL (ref 8–23)
CO2: 24 mmol/L (ref 22–32)
Calcium: 8.8 mg/dL — ABNORMAL LOW (ref 8.9–10.3)
Chloride: 103 mmol/L (ref 98–111)
Creatinine, Ser: 0.75 mg/dL (ref 0.61–1.24)
GFR, Estimated: >60 mL/min (ref >60)
Glucose, Bld: 130 mg/dL — ABNORMAL HIGH (ref 70–99)
Potassium: 3.4 mmol/L — ABNORMAL LOW (ref 3.5–5.1)
Sodium: 137 mmol/L (ref 135–145)

## 2021-04-12 LAB — SODIUM
Sodium: 141 mmol/L (ref 135–145)
Sodium: 145 mmol/L (ref 135–145)

## 2021-04-12 LAB — RAPID URINE DRUG SCREEN, HOSP PERFORMED
Amphetamines: NOT DETECTED
Barbiturates: NOT DETECTED
Benzodiazepines: NOT DETECTED
Cocaine: NOT DETECTED
Opiates: NOT DETECTED
Tetrahydrocannabinol: NOT DETECTED

## 2021-04-12 LAB — ECHOCARDIOGRAM COMPLETE
Height: 66 in
S' Lateral: 2.7 cm
Weight: 2638.47 oz

## 2021-04-12 LAB — MRSA NEXT GEN BY PCR, NASAL: MRSA by PCR Next Gen: NOT DETECTED

## 2021-04-12 MED ORDER — LEVETIRACETAM 500 MG PO TABS
1000.0000 mg | ORAL_TABLET | Freq: Two times a day (BID) | ORAL | Status: DC
  Administered 2021-04-12 – 2021-04-13 (×2): 1000 mg via ORAL
  Filled 2021-04-12 (×3): qty 2

## 2021-04-12 MED ORDER — ORAL CARE MOUTH RINSE
15.0000 mL | Freq: Two times a day (BID) | OROMUCOSAL | Status: DC
  Administered 2021-04-12 – 2021-04-15 (×6): 15 mL via OROMUCOSAL

## 2021-04-12 MED ORDER — SODIUM CHLORIDE 3 % IV SOLN
INTRAVENOUS | Status: DC
  Filled 2021-04-12 (×4): qty 500

## 2021-04-12 MED ORDER — HYDRALAZINE HCL 50 MG PO TABS
50.0000 mg | ORAL_TABLET | Freq: Three times a day (TID) | ORAL | Status: DC
  Administered 2021-04-12 – 2021-04-13 (×2): 50 mg via ORAL
  Filled 2021-04-12 (×2): qty 1

## 2021-04-12 MED ORDER — AMLODIPINE BESYLATE 10 MG PO TABS
10.0000 mg | ORAL_TABLET | Freq: Every day | ORAL | Status: DC
  Administered 2021-04-12 – 2021-04-13 (×2): 10 mg via ORAL
  Filled 2021-04-12 (×2): qty 1

## 2021-04-12 MED ORDER — CHLORHEXIDINE GLUCONATE CLOTH 2 % EX PADS
6.0000 | MEDICATED_PAD | Freq: Every day | CUTANEOUS | Status: DC
  Administered 2021-04-12 – 2021-04-18 (×6): 6 via TOPICAL

## 2021-04-12 MED ORDER — SODIUM CHLORIDE 3 % IV SOLN
INTRAVENOUS | Status: DC
  Filled 2021-04-12: qty 500

## 2021-04-12 MED ORDER — IOHEXOL 350 MG/ML SOLN
75.0000 mL | Freq: Once | INTRAVENOUS | Status: AC | PRN
  Administered 2021-04-12: 75 mL via INTRAVENOUS

## 2021-04-12 NOTE — ED Notes (Signed)
Report received from Rochel Brome. No new orders as of 2300 for pt. Previous nurse made aware of past orders.

## 2021-04-12 NOTE — Progress Notes (Signed)
STROKE TEAM PROGRESS NOTE   SUBJECTIVE (INTERVAL HISTORY) His RN is at the bedside.  Pt lying in bed, lethargic but oriented and following simple commands. But profound left sided weakness and neglect with left gaze. On 3% saline. Repeat CT showed slight increase hematoma, continue to keep BP goal < 140 and will do MRI in am. Still in afib.    OBJECTIVE Temp:  [97.6 F (36.4 C)-99 F (37.2 C)] 97.8 F (36.6 C) (07/13 0800) Pulse Rate:  [52-95] 80 (07/13 0900) Resp:  [16-27] 22 (07/13 0900) BP: (120-161)/(65-96) 138/80 (07/13 0900) SpO2:  [91 %-99 %] 92 % (07/13 0900) Weight:  [74.8 kg] 74.8 kg (07/12 1821)  Recent Labs  Lab 04/08/2021 2327 04/12/21 0311 04/12/21 0743 04/12/21 1016 04/12/21 1121  GLUCAP 132* 132* 132* 150* 133*   Recent Labs  Lab 04/16/2021 1756 04/28/2021 1804 04/12/21 0817  NA 140 141 137  K 3.2* 3.1* 3.4*  CL 105 105 103  CO2 24  --  24  GLUCOSE 176* 173* 130*  BUN 27* 29* 15  CREATININE 1.02 0.90 0.75  CALCIUM 9.1  --  8.8*   Recent Labs  Lab 04/08/2021 1756  AST 22  ALT 20  ALKPHOS 59  BILITOT 1.8*  PROT 7.0  ALBUMIN 4.1   Recent Labs  Lab 04/03/2021 1756 04/10/2021 1804 04/12/21 0817  WBC 7.5  --  8.5  NEUTROABS 6.2  --   --   HGB 16.0 16.0 16.6  HCT 47.4 47.0 47.7  MCV 93.3  --  91.2  PLT 160  --  174   No results for input(s): CKTOTAL, CKMB, CKMBINDEX, TROPONINI in the last 168 hours. Recent Labs    04/06/2021 1756  LABPROT 12.9  INR 1.0   Recent Labs    04/12/21 0106  COLORURINE YELLOW  LABSPEC 1.019  PHURINE 7.0  GLUCOSEU NEGATIVE  HGBUR NEGATIVE  BILIRUBINUR NEGATIVE  KETONESUR 20*  PROTEINUR NEGATIVE  NITRITE NEGATIVE  LEUKOCYTESUR NEGATIVE    No results found for: CHOL, TRIG, HDL, CHOLHDL, VLDL, LDLCALC No results found for: HGBA1C    Component Value Date/Time   LABOPIA NONE DETECTED 04/12/2021 0107   COCAINSCRNUR NONE DETECTED 04/12/2021 0107   LABBENZ NONE DETECTED 04/12/2021 0107   AMPHETMU NONE DETECTED  04/12/2021 0107   THCU NONE DETECTED 04/12/2021 0107   LABBARB NONE DETECTED 04/12/2021 0107    No results for input(s): ETH in the last 168 hours.  I have personally reviewed the radiological images below and agree with the radiology interpretations.  CT ANGIO HEAD W OR WO CONTRAST  Result Date: 04/12/2021 CLINICAL DATA:  80 year old male code stroke presentation on 04/20/2021 with right lentiform intra-axial hemorrhage. Subsequent encounter. EXAM: CT ANGIOGRAPHY HEAD AND NECK TECHNIQUE: Multidetector CT imaging of the head and neck was performed using the standard protocol during bolus administration of intravenous contrast. Multiplanar CT image reconstructions and MIPs were obtained to evaluate the vascular anatomy. Carotid stenosis measurements (when applicable) are obtained utilizing NASCET criteria, using the distal internal carotid diameter as the denominator. CONTRAST:  45mL OMNIPAQUE IOHEXOL 350 MG/ML SOLN COMPARISON:  Head CT 04/01/2021. FINDINGS: CT HEAD Brain: Calcified atherosclerosis at the skull base. Hyperdense intra-axial hemorrhage centered at the right lentiform encompasses 62 by 28 x 49 mm (AP by transverse by CC) for an estimated blood volume of 43 mL, and has mildly extended since presentation (approximately 31 mL at that time). But surrounding edema and regional mass effect have not significantly changed. Partially effaced right lateral  ventricle with no ventriculomegaly. No intraventricular or extra-axial extension of blood. No significant midline shift. Basilar cisterns remain patent. Stable gray-white matter differentiation elsewhere. 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 Calvarium and skull base: Intact, negative. Paranasal sinuses: Continued paranasal sinus opacification maximal in the ethmoids. Tympanic cavities and mastoids remain clear. Orbits: Visualized orbits and scalp soft tissues are within normal limits. Negative noncontrast deep soft tissue spaces of the  face. CTA NECK Skeleton: Right side TMJ degeneration. Widespread cervical spine degeneration. No acute or suspicious osseous lesion identified. Upper chest: Mild upper lobe lung scarring mostly on the left. Retained secretions, bubbly opacity in the visible trachea. No superior mediastinal lymphadenopathy. Other neck: Negative.  No neck mass or lymphadenopathy. Aortic arch: Extensive soft and calcified arch atherosclerosis. Three vessel arch configuration. Great vessel origins relatively spared. Right carotid system: Mild brachiocephalic artery and right CCA origin atherosclerosis without stenosis. Mildly tortuous right CCA. Mostly calcified plaque at the right carotid bifurcation and posterior right ICA bulb without stenosis. Mildly tortuous ICA to the skull base. Left carotid system: Minimal plaque at the left CCA origin without stenosis. Soft plaque in the medial vessel at the level of the larynx on series 5, image 131 without stenosis. Minor plaque at the left ICA origin without stenosis. Mildly tortuous left ICA to the skull base. Vertebral arteries: Soft and calcified plaque in the proximal right subclavian artery without stenosis. Right vertebral artery origin remains normal. Tortuous right V1 and proximal V2 segments. Patent right vertebral artery to the skull base without significant plaque or stenosis. Soft and calcified plaque in the proximal left subclavian artery without stenosis. Mild involvement of the left vertebral artery origin without stenosis. Tortuous left V1 and proximal V2 segments. Codominant left vertebral artery is patent to the skull base without significant plaque or stenosis. CTA HEAD Posterior circulation: Codominant distal vertebral arteries with mild calcified plaque mostly on the left. No significant stenosis. Patent PICA origins and vertebrobasilar junction. Patent basilar artery with mild irregularity but no significant stenosis. Patent SCA and right PCA origins. Fetal left PCA  origin. Right posterior communicating artery diminutive or absent. Bilateral P2 segments are mildly irregular without stenosis. But there is bilateral P3 segment stenosis, mild to moderate involving the inferior division on the left (series 10, image 31) and moderate to severe involving the right P3 trunk on series 10, image 26. Anterior circulation: Both ICA siphons are patent. Abundant siphon calcified plaque on both sides but no significant stenosis on the left. There is moderate stenosis of the right supraclinoid ICA on series 7, image 93. Normal left posterior communicating artery. Patent carotid termini. MCA and ACA origins are within normal limits. No convincing CTA spot sign associated with the right lentiform hemorrhage. Mildly dominant and tortuous right ACA A1 segment. Mild left A1 irregularity without stenosis. 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 Otherwise the bilateral ACA branches are within normal limits. Left MCA M1 segment and bifurcation are patent without stenosis. Left MCA branches are within normal limits. Right MCA M1 segment and bifurcation are patent without stenosis. Right MCA branches are within normal limits. Venous sinuses: Early contrast timing but grossly patent. Anatomic variants: Fetal left PCA origin. Mildly dominant right ACA A1. Review of the MIP images confirms the above findings IMPRESSION: 1. Right lentiform intra-axial hemorrhage has slightly enlarged since presentation, estimated blood volume now 43 mL (previously 31 mL). But there is no CTA  spot sign, and mild regional edema and mass effect are stable. 2. 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 There is up to Moderate stenosis of the Right supraclinoid ICA due to calcified plaque. And there is Moderate To Severe bilateral PCA P3 segment stenosis. No significant Vertebrobasilar or  cervical carotid stenosis. 3. Aortic Atherosclerosis (ICD10-I70.0). Electronically Signed   By: Genevie Ann M.D.   On: 04/12/2021 06:27   DG CHEST PORT 1 VIEW  Result Date: 04/12/2021 CLINICAL DATA:  80 year old male with intracranial hemorrhage. Hypoxia. EXAM: PORTABLE CHEST 1 VIEW COMPARISON:  Chest radiographs 08/28/2006. CTA neck today reported separately. FINDINGS: Portable AP semi upright view at 0415 hours. Mildly lower lung volumes compared to 2007. Normal cardiac size and mediastinal contours. Visualized tracheal air column is within normal limits. Allowing for portable technique the lungs are clear. No pneumothorax or pleural effusion. Chronic left anterior rib cerclage wires are stable. Paucity of bowel gas in the upper abdomen. IMPRESSION: No acute cardiopulmonary abnormality. Electronically Signed   By: Genevie Ann M.D.   On: 04/12/2021 06:29   CT HEAD CODE STROKE WO CONTRAST  Result Date: 03/31/2021 CLINICAL DATA:  Code stroke. EXAM: CT HEAD WITHOUT CONTRAST TECHNIQUE: Contiguous axial images were obtained from the base of the skull through the vertex without intravenous contrast. COMPARISON:  None. FINDINGS: Brain: Acute parenchymal hemorrhage is present centered in the right basal ganglia region with extension into the corona radiata superiorly and temporal lobe inferiorly. Mild surrounding edema. Regional mass effect is present with partial effacement of the right lateral ventricle. No substantial midline shift. No intraventricular extension. No hydrocephalus. Gray-white differentiation is preserved. 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 Additional patchy low-attenuation in the supratentorial white matter probably reflects chronic microvascular ischemic changes. Vascular: There is intracranial atherosclerotic calcification at the skull base. Skull: Unremarkable. Sinuses/Orbits: Ethmoid dominant mucosal thickening. Orbits are unremarkable. Other: Mastoid air cells are clear.  IMPRESSION: Acute parenchymal hemorrhage centered in the right basal ganglia region with mild edema and regional mass effect. No intraventricular extension. These results were communicated to Dr. Curly Shores at 6:11 pm on 04/24/2021 by text page via the [facility] messaging system. Electronically Signed   By: Macy Mis M.D.   On: 04/05/2021 18:15   CT ANGIO NECK CODE STROKE  Result Date: 04/12/2021 CLINICAL DATA:  80 year old male code stroke presentation on 04/10/2021 with right lentiform intra-axial hemorrhage. Subsequent encounter. EXAM: CT ANGIOGRAPHY HEAD AND NECK TECHNIQUE: Multidetector CT imaging of the head and neck was performed using the standard protocol during bolus administration of intravenous contrast. Multiplanar CT image reconstructions and MIPs were obtained to evaluate the vascular anatomy. Carotid stenosis measurements (when applicable) are obtained utilizing NASCET criteria, using the distal internal carotid diameter as the denominator. CONTRAST:  68mL OMNIPAQUE IOHEXOL 350 MG/ML SOLN COMPARISON:  Head CT 04/20/2021. FINDINGS: CT HEAD Brain: Calcified atherosclerosis at the skull base. Hyperdense intra-axial hemorrhage centered at the right lentiform encompasses 62 by 28 x 49 mm (AP by transverse by CC) for an estimated blood volume of 43 mL, and has mildly extended since presentation (approximately 31 mL at that time). But surrounding edema and regional mass effect have not significantly changed. Partially effaced right lateral ventricle with no ventriculomegaly. No intraventricular or extra-axial extension of blood. No significant midline shift. Basilar cisterns remain patent. Stable gray-white matter differentiation elsewhere. 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 Calvarium and skull base: Intact, negative. Paranasal sinuses: Continued paranasal  sinus opacification maximal in the ethmoids. Tympanic cavities and mastoids remain clear. Orbits: Visualized orbits and scalp soft  tissues are within normal limits. Negative noncontrast deep soft tissue spaces of the face. CTA NECK Skeleton: Right side TMJ degeneration. Widespread cervical spine degeneration. No acute or suspicious osseous lesion identified. Upper chest: Mild upper lobe lung scarring mostly on the left. Retained secretions, bubbly opacity in the visible trachea. No superior mediastinal lymphadenopathy. Other neck: Negative.  No neck mass or lymphadenopathy. Aortic arch: Extensive soft and calcified arch atherosclerosis. Three vessel arch configuration. Great vessel origins relatively spared. Right carotid system: Mild brachiocephalic artery and right CCA origin atherosclerosis without stenosis. Mildly tortuous right CCA. Mostly calcified plaque at the right carotid bifurcation and posterior right ICA bulb without stenosis. Mildly tortuous ICA to the skull base. Left carotid system: Minimal plaque at the left CCA origin without stenosis. Soft plaque in the medial vessel at the level of the larynx on series 5, image 131 without stenosis. Minor plaque at the left ICA origin without stenosis. Mildly tortuous left ICA to the skull base. Vertebral arteries: Soft and calcified plaque in the proximal right subclavian artery without stenosis. Right vertebral artery origin remains normal. Tortuous right V1 and proximal V2 segments. Patent right vertebral artery to the skull base without significant plaque or stenosis. Soft and calcified plaque in the proximal left subclavian artery without stenosis. Mild involvement of the left vertebral artery origin without stenosis. Tortuous left V1 and proximal V2 segments. Codominant left vertebral artery is patent to the skull base without significant plaque or stenosis. CTA HEAD Posterior circulation: Codominant distal vertebral arteries with mild calcified plaque mostly on the left. No significant stenosis. Patent PICA origins and vertebrobasilar junction. Patent basilar artery with mild  irregularity but no significant stenosis. Patent SCA and right PCA origins. Fetal left PCA origin. Right posterior communicating artery diminutive or absent. Bilateral P2 segments are mildly irregular without stenosis. But there is bilateral P3 segment stenosis, mild to moderate involving the inferior division on the left (series 10, image 31) and moderate to severe involving the right P3 trunk on series 10, image 26. Anterior circulation: Both ICA siphons are patent. Abundant siphon calcified plaque on both sides but no significant stenosis on the left. There is moderate stenosis of the right supraclinoid ICA on series 7, image 93. Normal left posterior communicating artery. Patent carotid termini. MCA and ACA origins are within normal limits. No convincing CTA spot sign associated with the right lentiform hemorrhage. Mildly dominant and tortuous right ACA A1 segment. Mild left A1 irregularity without stenosis. 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 Otherwise the bilateral ACA branches are within normal limits. Left MCA M1 segment and bifurcation are patent without stenosis. Left MCA branches are within normal limits. Right MCA M1 segment and bifurcation are patent without stenosis. Right MCA branches are within normal limits. Venous sinuses: Early contrast timing but grossly patent. Anatomic variants: Fetal left PCA origin. Mildly dominant right ACA A1. Review of the MIP images confirms the above findings IMPRESSION: 1. Right lentiform intra-axial hemorrhage has slightly enlarged since presentation, estimated blood volume now 43 mL (previously 31 mL). But there is no CTA spot sign, and mild regional edema and mass effect are stable. 2. 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 There is up to Moderate stenosis of the Right supraclinoid ICA due to calcified plaque. And  there is  Moderate To Severe bilateral PCA P3 segment stenosis. No significant Vertebrobasilar or cervical carotid stenosis. 3. Aortic Atherosclerosis (ICD10-I70.0). Electronically Signed   By: Genevie Ann M.D.   On: 04/12/2021 06:27      PHYSICAL EXAM  Temp:  [97.6 F (36.4 C)-99 F (37.2 C)] 97.8 F (36.6 C) (07/13 0800) Pulse Rate:  [52-95] 80 (07/13 0900) Resp:  [16-27] 22 (07/13 0900) BP: (120-161)/(65-96) 138/80 (07/13 0900) SpO2:  [91 %-99 %] 92 % (07/13 0900) Weight:  [74.8 kg] 74.8 kg (07/12 1821)  General - Well nourished, well developed, lethargic and sleepy.  Ophthalmologic - fundi not visualized due to noncooperation.  Cardiovascular - irregularly irregular heart rate and rhythm.  Neuro - lethargic, drowsy and sleepy but easily arousable and able to maintian eyes open, orientated to age, place, month, but not to year. No aphasia, able to answer questions appropriately, following all simple commands. Able to name and repeat. Right forced gaze, not blinking to visual threat on the right, right hemianopia, PERRL. Left facial droop. Tongue midline. Right UEs 4/5, no drift, left UE flaccid. RLE 3-/5 on pain stimulation. LLE slight withdraw to pain. Sensation seems to have sensory loss on the left with sensory neglect but not quite cooperative with exam, right FTN intact grossly, gait not tested.    ASSESSMENT/PLAN Mr. Richard Summers is a 80 y.o. male with history of hypertension, hyperlipidemia, MI status post CABG x3, OSA, seizure on Keppra admitted for left-sided weakness, left neglect, fall. No tPA given due to [place].    ICH:  right BG ICH likely hypertensive etiology.  However, given new diagnosed A. fib, hemorrhagic conversion is also in DDx. CT head right BG ICH CTA head and neck moderate stenosis of the right supraclinoid ICA, moderate to severe bilateral P3.  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 CT repeat showed slight increase of right BG hemorrhage, no CT spot sign, mild regional  edema and mass-effect MRI pending in a.m. 2D Echo EF 45% LDL pending HgbA1c pending SCDs for VTE prophylaxis No antithrombotic prior to admission, now on No antithrombotic due to [place] Ongoing aggressive stroke risk factor management Therapy recommendations: Pending Disposition: Pending  Cerebral edema CT head repeat showed slight increase of right BG ICH, mild regional edema and mass-effect Given the size of ICH, put on 3% saline Na goal 150-155 Na 137->141 3% saline @ 50->75 MRI in am  New diagnosed A. fib EKG showed A. Fib Telemetry continued A. Fib Rate controlled  History of seizure On home Keppra XR 2000 daily Continue Keppra 1 g twice daily  Hypertension Stable on Cleviprex BP goal < 140 for now Resume home HCTZ and losartan Add amlodipine and hydralazine Taper off Cleviprex as able Long term BP goal normotensive  Hyperlipidemia Home meds: Lipitor 10 LDL pending, goal < 70 Hold off Lipitor for now Consider to resume statin at discharge  Dysphagia Passed barium study  On dysphagia 1 with nectar thick liquid Speech on board  Other Stroke Risk Factors Advanced age Coronary artery disease/MI status post CABG x3 Obstructive sleep apnea, on CPAP at home  Other Active Problems [facility]  Hospital day # 1  This patient is critically ill due to large right BG ICH, cerebral edema, new diagnosed A. fib, hypertensive emergency and at significant risk of neurological worsening, death form hematoma expansion, brain herniation, seizure, heart failure, encephalopathy. This patient's care requires constant monitoring of vital signs, hemodynamics, respiratory and cardiac monitoring, review of multiple  databases, neurological assessment, discussion with family, other specialists and medical decision making of high complexity. I spent 40 minutes of neurocritical care time in the care of this patient.    Rosalin Hawking, MD PhD Stroke Neurology 04/12/2021 11:38 AM    To  contact Stroke Continuity provider, please refer to [email or web address] After hours, contact General Neurology

## 2021-04-12 NOTE — Progress Notes (Signed)
  Echocardiogram 2D Echocardiogram has been performed.  Fidel Levy 04/12/2021, 12:36 PM

## 2021-04-12 NOTE — Progress Notes (Signed)
PT Cancellation Note  Patient Details Name: Tynan Boesel MRN: [id number] DOB: 01-Oct-1941   Cancelled Treatment:    Reason Eval/Treat Not Completed: Patient not medically ready. MD requesting hold off on PT eval this date. Will plan to follow-up tomorrow as appropriate.   Moishe Spice, PT, DPT Acute Rehabilitation Services  Pager: [phone] Office: [place] 04/12/2021, 12:04 PM

## 2021-04-12 NOTE — ED Notes (Signed)
Pt sitting in room. Alert, responding to simple commands. L side neglect present. R focal gaze. Pt with mild gag reflex at this time. VS updated. Pt repeating self. Updated on plan of care.

## 2021-04-12 NOTE — Progress Notes (Signed)
OT Cancellation Note  Patient Details Name: Richard Summers MRN: [id number] DOB: 1941-08-05   Cancelled Treatment:    Reason Eval/Treat Not Completed: Patient not medically ready.  Spoke with MD will hold today and hopefully will be ready for therapies tomorrow.  Nilsa Nutting., OTR/L Acute Rehabilitation Services Pager [phone] Office [phone]   Lucille Passy M 04/12/2021, 11:30 AM

## 2021-04-12 NOTE — Evaluation (Addendum)
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 Plates and screws installed   TONSILLECTOMY     triple bypass     VASECTOMY     HPI:  80 yr old man  with history of epilepsy, Barrett's esophagus, GERD, HTN, MI s/p bypass, and OSA who had a fall in the shower with sudden left sided  hemiplegia with a fixed rt gaze. CT revealed right Basal Ganglia hemorrhage. CT repeated am 7/13 with right lentiform intra-axial hemorrhage has slightly enlarged since presentation.   Assessment / Plan / Recommendation Clinical Impression  Pt demonstrating questionable integrity of laryngeal protection. His facial, lingual ROM and strength and sensation are reduced with weak volitional cough and cognitive concerns (including decrease attention, distractibility) and possible anxiety. Although minimal delayed throat clear with thin there is potential for additional dysfunction needing objective measures. Will plan  for MBS today at 1300. SLP Visit Diagnosis: Dysphagia, unspecified (R13.10)    Aspiration Risk  Mild aspiration risk    Diet Recommendation NPO        Other  Recommendations Oral Care Recommendations: Oral care QID   Follow up Recommendations        Frequency and Duration            Prognosis Prognosis for Safe Diet Advancement:  (TBD)      Swallow Study   General Date of Onset: 04/28/2021 HPI: 80 yr old man  with history of epilepsy, Barrett's esophagus, GERD, HTN, MI s/p bypass, and OSA who had a fall in the shower with sudden left sided  hemiplegia with a fixed rt gaze. CT revealed right Basal Ganglia hemorrhage. CT repeated am 7/13 with right lentiform intra-axial hemorrhage has slightly enlarged since presentation. Type of Study: Bedside Swallow Evaluation Previous Swallow Assessment: no Diet Prior to this Study: NPO Temperature Spikes Noted: No Respiratory Status: Nasal cannula History of Recent Intubation: No Behavior/Cognition: Alert;Cooperative;Pleasant mood;Distractible;Requires cueing Oral Cavity Assessment: Dry Oral Care Completed by SLP: Yes Oral Cavity - Dentition: Adequate natural dentition Vision:  (left neglect) Self-Feeding Abilities: Needs assist Patient Positioning: Upright in bed Baseline Vocal Quality: Normal Volitional Cough: Weak Volitional Swallow: Able to elicit    Oral/Motor/Sensory Function Overall Oral Motor/Sensory Function: Moderate impairment Facial ROM: Reduced left;Suspected CN VII (facial) dysfunction Facial Symmetry: Abnormal symmetry left;Suspected CN  VII (facial) dysfunction Facial Strength: Reduced left;Suspected CN VII (facial) dysfunction Lingual ROM: Reduced right;Suspected CN XII (hypoglossal) dysfunction Lingual Symmetry: Abnormal symmetry left   Ice Chips Ice chips: Not tested   Thin Liquid Thin Liquid: Impaired Presentation: Straw Pharyngeal  Phase Impairments: Throat Clearing - Delayed    Nectar Thick Nectar Thick  Liquid: Not tested   Honey Thick Honey Thick Liquid: Not tested   Puree Puree: Impaired Oral Phase Functional Implications:  (labial residue) Pharyngeal Phase Impairments:  (none)   Solid     Solid: Not tested      Houston Siren 04/12/2021,9:06 AM  Orbie Pyo Colvin Caroli.Ed Risk analyst [phone] Office [phone]

## 2021-04-12 NOTE — Progress Notes (Signed)
Modified Barium Swallow Progress Note  Patient Details  Name: Richard Summers MRN: [id number] Date of Birth: [birth year]  Today's Date: 04/12/2021  Modified Barium Swallow completed.  Full report located under Chart Review in the Imaging Section.  Brief recommendations include the following:  Clinical Impression  Oral phase of swallow marked by decreased cohesion, ROM and manipulation resulted in anterior spill, left lateral sulci residue, lingual residue, holding and transit delays. Lingual residue spilled to valleculae and cleared with cues and spontaneous swallows. Pharyngeal and laryngeal mobility and strength were adequate once swallow was initiated. Poor timing to close larynx occured with thin liquid barium resulting in incomplete laryngeal closure and aspiration with reflexive cough (PAS 7) unable to clear trachea. Pt was very restless, pushing to his left, distracted and therapist unable to attempt compensatory strategies during study. Recommend Dys 1 (puree), nectar thick liquids, crush pills, check left buccal cavity for pocketing, limit distractions, straws allowed and full supervision and assist.   Swallow Evaluation Recommendations       SLP Diet Recommendations: Dysphagia 1 (Puree) solids;Nectar thick liquid   Liquid Administration via: Straw;Cup   Medication Administration: Crushed with puree   Supervision: Patient able to self feed;Staff to assist with self feeding;Full supervision/cueing for compensatory strategies   Compensations: Minimize environmental distractions;Slow rate;Small sips/bites;Lingual sweep for clearance of pocketing;Monitor for anterior loss   Postural Changes: Seated upright at 90 degrees   Oral Care Recommendations: Oral care BID        Houston Siren 04/12/2021,2:31 PM

## 2021-04-12 NOTE — Progress Notes (Signed)
Repeat CT head completed with report at 6:07 AM describing the following findings: Right lentiform intra-axial hemorrhage has slightly enlarged since presentation, estimated blood volume now 43 mL (previously 31 mL). But there is no CTA spot sign, and mild regional edema and mass effect are stable.  Images personally reviewed. I agree with the official Radiology report.   Hypertonic saline at 50 cc per hour has been ordered.   Electronically signed: Dr. Kerney Elbe

## 2021-04-12 NOTE — Progress Notes (Signed)
Pt's visitor Richard Summers states that she is Richard Summers. She has been assisting Richard. and [name] for many years. She says that [name] is now in the hospital as well. She will bring in pt's Advanced Directives at her next visit. She can be reached at [phone]

## 2021-04-12 NOTE — Progress Notes (Signed)
Patient had a CT scheduled for 0005, it was ordered at around 2000. The patient was brought up to 4N ICU from the ED around 0200 and the scan had not been done. CT was called at 0300 and I was told they would call me when they had a chance to take the patient for the scan.

## 2021-04-13 ENCOUNTER — Inpatient Hospital Stay (HOSPITAL_COMMUNITY): Payer: Medicare Other

## 2021-04-13 ENCOUNTER — Ambulatory Visit: Admission: RE | Admit: 2021-04-13 | Payer: Medicare Other | Source: Ambulatory Visit

## 2021-04-13 DIAGNOSIS — Z9989 Dependence on other enabling machines and devices: Secondary | ICD-10-CM

## 2021-04-13 DIAGNOSIS — Z978 Presence of other specified devices: Secondary | ICD-10-CM

## 2021-04-13 DIAGNOSIS — G936 Cerebral edema: Secondary | ICD-10-CM | POA: Diagnosis not present

## 2021-04-13 DIAGNOSIS — I619 Nontraumatic intracerebral hemorrhage, unspecified: Secondary | ICD-10-CM

## 2021-04-13 DIAGNOSIS — E7800 Pure hypercholesterolemia, unspecified: Secondary | ICD-10-CM | POA: Diagnosis not present

## 2021-04-13 DIAGNOSIS — R0902 Hypoxemia: Secondary | ICD-10-CM

## 2021-04-13 DIAGNOSIS — G4733 Obstructive sleep apnea (adult) (pediatric): Secondary | ICD-10-CM

## 2021-04-13 DIAGNOSIS — I610 Nontraumatic intracerebral hemorrhage in hemisphere, subcortical: Secondary | ICD-10-CM | POA: Diagnosis not present

## 2021-04-13 DIAGNOSIS — I161 Hypertensive emergency: Secondary | ICD-10-CM | POA: Diagnosis not present

## 2021-04-13 DIAGNOSIS — T17908A Unspecified foreign body in respiratory tract, part unspecified causing other injury, initial encounter: Secondary | ICD-10-CM | POA: Diagnosis not present

## 2021-04-13 LAB — HEMOGLOBIN A1C
Hgb A1c MFr Bld: 6.2 % — ABNORMAL HIGH (ref 4.8–5.6)
Mean Plasma Glucose: 131.24 mg/dL

## 2021-04-13 LAB — BASIC METABOLIC PANEL
Anion gap: 11 (ref 5–15)
Anion gap: 12 (ref 5–15)
BUN: 18 mg/dL (ref 8–23)
BUN: 29 mg/dL — ABNORMAL HIGH (ref 8–23)
CO2: 20 mmol/L — ABNORMAL LOW (ref 22–32)
CO2: 14 mmol/L — ABNORMAL LOW (ref 22–32)
Calcium: 8.5 mg/dL — ABNORMAL LOW (ref 8.9–10.3)
Calcium: 8.3 mg/dL — ABNORMAL LOW (ref 8.9–10.3)
Chloride: 116 mmol/L — ABNORMAL HIGH (ref 98–111)
Chloride: 127 mmol/L — ABNORMAL HIGH (ref 98–111)
Creatinine, Ser: 1.12 mg/dL (ref 0.61–1.24)
Creatinine, Ser: 1.79 mg/dL — ABNORMAL HIGH (ref 0.61–1.24)
GFR, Estimated: >60 mL/min (ref >60)
GFR, Estimated: 38 mL/min — ABNORMAL LOW (ref >60)
Glucose, Bld: 151 mg/dL — ABNORMAL HIGH (ref 70–99)
Glucose, Bld: 258 mg/dL — ABNORMAL HIGH (ref 70–99)
Potassium: 3.2 mmol/L — ABNORMAL LOW (ref 3.5–5.1)
Potassium: 3.1 mmol/L — ABNORMAL LOW (ref 3.5–5.1)
Sodium: 147 mmol/L — ABNORMAL HIGH (ref 135–145)
Sodium: 153 mmol/L — ABNORMAL HIGH (ref 135–145)

## 2021-04-13 LAB — CBC
HCT: 45 % (ref 39.0–52.0)
Hemoglobin: 15.5 g/dL (ref 13.0–17.0)
MCH: 31.7 pg (ref 26.0–34.0)
MCHC: 34.4 g/dL (ref 30.0–36.0)
MCV: 92 fL (ref 80.0–100.0)
Platelets: 173 10*3/uL (ref 150–400)
RBC: 4.89 MIL/uL (ref 4.22–5.81)
RDW: 12.8 % (ref 11.5–15.5)
WBC: 10 10*3/uL (ref 4.0–10.5)
nRBC: 0 % (ref 0.0–0.2)

## 2021-04-13 LAB — POCT I-STAT 7, (LYTES, BLD GAS, ICA,H+H)
Acid-base deficit: 4 mmol/L — ABNORMAL HIGH (ref 0.0–2.0)
Bicarbonate: 23.5 mmol/L (ref 20.0–28.0)
Calcium, Ion: 1.25 mmol/L (ref 1.15–1.40)
HCT: 40 % (ref 39.0–52.0)
Hemoglobin: 13.6 g/dL (ref 13.0–17.0)
O2 Saturation: 100 %
Patient temperature: 101
Potassium: 2.9 mmol/L — ABNORMAL LOW (ref 3.5–5.1)
Sodium: 163 mmol/L (ref 135–145)
TCO2: 25 mmol/L (ref 22–32)
pCO2 arterial: 55.6 mmHg — ABNORMAL HIGH (ref 32.0–48.0)
pH, Arterial: 7.242 — ABNORMAL LOW (ref 7.350–7.450)
pO2, Arterial: 245 mmHg — ABNORMAL HIGH (ref 83.0–108.0)

## 2021-04-13 LAB — SODIUM
Sodium: 151 mmol/L — ABNORMAL HIGH (ref 135–145)
Sodium: 154 mmol/L — ABNORMAL HIGH (ref 135–145)
Sodium: 155 mmol/L — ABNORMAL HIGH (ref 135–145)

## 2021-04-13 LAB — GLUCOSE, CAPILLARY
Glucose-Capillary: 142 mg/dL — ABNORMAL HIGH (ref 70–99)
Glucose-Capillary: 165 mg/dL — ABNORMAL HIGH (ref 70–99)
Glucose-Capillary: 175 mg/dL — ABNORMAL HIGH (ref 70–99)
Glucose-Capillary: 254 mg/dL — ABNORMAL HIGH (ref 70–99)
Glucose-Capillary: 286 mg/dL — ABNORMAL HIGH (ref 70–99)
Glucose-Capillary: 204 mg/dL — ABNORMAL HIGH (ref 70–99)

## 2021-04-13 LAB — LIPID PANEL
Cholesterol: 239 mg/dL — ABNORMAL HIGH (ref 0–200)
HDL: 72 mg/dL (ref >40)
LDL Cholesterol: 149 mg/dL — ABNORMAL HIGH (ref 0–99)
Total CHOL/HDL Ratio: 3.3 RATIO
Triglycerides: 88 mg/dL (ref <150)
VLDL: 18 mg/dL (ref 0–40)

## 2021-04-13 MED ORDER — FENTANYL CITRATE (PF) 100 MCG/2ML IJ SOLN
INTRAMUSCULAR | Status: AC
  Filled 2021-04-13: qty 2

## 2021-04-13 MED ORDER — SODIUM CHLORIDE 3 % IV SOLN
INTRAVENOUS | Status: DC
  Filled 2021-04-13 (×2): qty 500

## 2021-04-13 MED ORDER — ALBUTEROL SULFATE (2.5 MG/3ML) 0.083% IN NEBU: 2.5000 mg | INHALATION_SOLUTION | RESPIRATORY_TRACT | Status: DC | PRN

## 2021-04-13 MED ORDER — AMLODIPINE BESYLATE 10 MG PO TABS: 10.0000 mg | ORAL_TABLET | Freq: Every day | ORAL | Status: DC

## 2021-04-13 MED ORDER — SENNOSIDES-DOCUSATE SODIUM 8.6-50 MG PO TABS
1.0000 | ORAL_TABLET | Freq: Two times a day (BID) | ORAL | Status: DC
  Administered 2021-04-14 – 2021-04-18 (×9): 1
  Filled 2021-04-13 (×9): qty 1

## 2021-04-13 MED ORDER — LOSARTAN POTASSIUM 50 MG PO TABS: 50.0000 mg | ORAL_TABLET | Freq: Two times a day (BID) | ORAL | Status: DC

## 2021-04-13 MED ORDER — LABETALOL HCL 5 MG/ML IV SOLN
5.0000 mg | INTRAVENOUS | Status: DC | PRN
  Administered 2021-04-15: 10 mg via INTRAVENOUS
  Administered 2021-04-18: 15 mg via INTRAVENOUS
  Filled 2021-04-13 (×2): qty 4

## 2021-04-13 MED ORDER — FENTANYL CITRATE (PF) 100 MCG/2ML IJ SOLN
25.0000 ug | INTRAMUSCULAR | Status: DC | PRN
Start: 2021-04-13 — End: 2021-04-14
  Administered 2021-04-14: 50 ug via INTRAVENOUS
  Filled 2021-04-13: qty 2

## 2021-04-13 MED ORDER — LEVOFLOXACIN IN D5W 750 MG/150ML IV SOLN
750.0000 mg | INTRAVENOUS | Status: DC
  Administered 2021-04-13 – 2021-04-15 (×2): 750 mg via INTRAVENOUS
  Filled 2021-04-13 (×3): qty 150

## 2021-04-13 MED ORDER — PANTOPRAZOLE SODIUM 40 MG PO TBEC
40.0000 mg | DELAYED_RELEASE_TABLET | Freq: Every day | ORAL | Status: DC
  Administered 2021-04-13: 40 mg via ORAL
  Filled 2021-04-13: qty 1

## 2021-04-13 MED ORDER — LOSARTAN POTASSIUM 50 MG PO TABS
50.0000 mg | ORAL_TABLET | Freq: Two times a day (BID) | ORAL | Status: DC
  Filled 2021-04-13: qty 1

## 2021-04-13 MED ORDER — ROCURONIUM BROMIDE 10 MG/ML (PF) SYRINGE
PREFILLED_SYRINGE | INTRAVENOUS | Status: AC
  Filled 2021-04-13: qty 10

## 2021-04-13 MED ORDER — FENTANYL CITRATE (PF) 100 MCG/2ML IJ SOLN: 25.0000 ug | INTRAMUSCULAR | Status: DC | PRN

## 2021-04-13 MED ORDER — POLYETHYLENE GLYCOL 3350 17 G PO PACK
17.0000 g | PACK | Freq: Every day | ORAL | Status: DC
  Administered 2021-04-14 – 2021-04-18 (×5): 17 g
  Filled 2021-04-13 (×5): qty 1

## 2021-04-13 MED ORDER — ETOMIDATE 2 MG/ML IV SOLN
INTRAVENOUS | Status: AC
  Filled 2021-04-13: qty 20

## 2021-04-13 MED ORDER — LEVETIRACETAM 100 MG/ML PO SOLN
1000.0000 mg | Freq: Two times a day (BID) | ORAL | Status: DC
  Administered 2021-04-13 – 2021-04-14 (×2): 1000 mg
  Filled 2021-04-13 (×2): qty 10

## 2021-04-13 MED ORDER — ORAL CARE MOUTH RINSE
15.0000 mL | OROMUCOSAL | Status: DC
  Administered 2021-04-13 – 2021-04-19 (×54): 15 mL via OROMUCOSAL

## 2021-04-13 MED ORDER — DOCUSATE SODIUM 50 MG/5ML PO LIQD
100.0000 mg | Freq: Two times a day (BID) | ORAL | Status: DC
  Administered 2021-04-14 (×2): 100 mg
  Filled 2021-04-13 (×2): qty 10

## 2021-04-13 MED ORDER — LABETALOL HCL 5 MG/ML IV SOLN
5.0000 mg | INTRAVENOUS | Status: DC | PRN
  Administered 2021-04-13: 20 mg via INTRAVENOUS
  Filled 2021-04-13: qty 4

## 2021-04-13 MED ORDER — POTASSIUM CHLORIDE 10 MEQ/100ML IV SOLN
10.0000 meq | INTRAVENOUS | Status: AC
  Administered 2021-04-14 (×2): 10 meq via INTRAVENOUS
  Filled 2021-04-13 (×2): qty 100

## 2021-04-13 MED ORDER — POTASSIUM CHLORIDE 20 MEQ PO PACK
40.0000 meq | PACK | ORAL | Status: DC
  Filled 2021-04-13: qty 2

## 2021-04-13 MED ORDER — CHLORHEXIDINE GLUCONATE 0.12% ORAL RINSE (MEDLINE KIT)
15.0000 mL | Freq: Two times a day (BID) | OROMUCOSAL | Status: DC
  Administered 2021-04-13 – 2021-04-19 (×12): 15 mL via OROMUCOSAL

## 2021-04-13 MED ORDER — PANTOPRAZOLE SODIUM 40 MG PO PACK
40.0000 mg | PACK | Freq: Every day | ORAL | Status: DC
  Administered 2021-04-14 – 2021-04-18 (×5): 40 mg
  Filled 2021-04-13 (×5): qty 20

## 2021-04-13 MED ORDER — HYDRALAZINE HCL 50 MG PO TABS
100.0000 mg | ORAL_TABLET | Freq: Three times a day (TID) | ORAL | Status: DC
  Administered 2021-04-13: 100 mg via ORAL
  Filled 2021-04-13 (×2): qty 2

## 2021-04-13 MED ORDER — HYDRALAZINE HCL 50 MG PO TABS: 100.0000 mg | ORAL_TABLET | Freq: Three times a day (TID) | ORAL | Status: DC

## 2021-04-13 MED ORDER — MIDAZOLAM HCL 2 MG/2ML IJ SOLN
INTRAMUSCULAR | Status: AC
  Administered 2021-04-13: 2 mg
  Filled 2021-04-13: qty 2

## 2021-04-13 MED ORDER — POTASSIUM CHLORIDE 20 MEQ PO PACK
40.0000 meq | PACK | ORAL | Status: AC
  Administered 2021-04-14 (×2): 40 meq
  Filled 2021-04-13 (×2): qty 2

## 2021-04-13 MED ORDER — POTASSIUM CHLORIDE 20 MEQ PO PACK: 40.0000 meq | PACK | ORAL | Status: DC

## 2021-04-13 MED ORDER — KETAMINE HCL 50 MG/5ML IJ SOSY
PREFILLED_SYRINGE | INTRAMUSCULAR | Status: AC
  Filled 2021-04-13: qty 5

## 2021-04-13 MED ORDER — PROPOFOL 1000 MG/100ML IV EMUL
0.0000 ug/kg/min | INTRAVENOUS | Status: DC
  Administered 2021-04-13 – 2021-04-14 (×2): 10 ug/kg/min via INTRAVENOUS
  Filled 2021-04-13 (×2): qty 100

## 2021-04-13 NOTE — Progress Notes (Signed)
Pharmacy Antibiotic Note  Jemery Stacey is a 80 y.o. male admitted on 04/25/2021 with pneumonia.  Pharmacy has been consulted for Levaquin dosing. Penicillin and sulfa allergy noted. No hx of cephalosporins. Tmax 101. WBC within normal limits. SCr 1.12 with estimated CrCl ~ 48 mL/min.   Plan: Levaquin 750 mg IV every 48 hours.  Monitor clinical status, culture results, and renal function.  Follow-up ability to narrow therapy as able.   Height: 5\' 6"  (167.6 cm) Weight: 74.8 kg (164 lb 14.5 oz) IBW/kg (Calculated) : 63.8  Temp (24hrs), Avg:99.2 F (37.3 C), Min:98.4 F (36.9 C), Max:101 F (38.3 C)  Recent Labs  Lab 04/28/2021 1756 04/22/2021 1804 04/12/21 0817 04/13/21 0238  WBC 7.5  --  8.5 10.0  CREATININE 1.02 0.90 0.75 1.12    Estimated Creatinine Clearance: 48.3 mL/min (by C-G formula based on SCr of 1.12 mg/dL).    Allergies  Allergen Reactions   Penicillins Anaphylaxis, Hives and Other (See Comments)   Sulfa Antibiotics Anaphylaxis, Hives and Other (See Comments)   Ezetimibe Other (See Comments)   Rosuvastatin Other (See Comments)   Statins     Other reaction(s): Other (See Comments) Confusion and mania     Antimicrobials this admission: Levaquin 7/14 >>  Dose adjustments this admission:   Microbiology results: 7/13 Sputum:  7/13 MRSA PCR: negative Thank you for allowing pharmacy to be a part of this patient's care.  Sloan Leiter, PharmD, BCPS, BCCCP Clinical Pharmacist Please refer to [facility] for [place] numbers 04/13/2021 9:24 PM

## 2021-04-13 NOTE — Progress Notes (Signed)
OT Cancellation Note  Patient Details Name: Blu Lori MRN: [id number] DOB: [birth year]   Cancelled Treatment:    Reason Eval/Treat Not Completed: Patient not medically ready. Pt scheduled for repeat MRI. Please notify OT/PT once pt cleared for activity. Thank you.  Ramond Dial, OT/L   Acute OT Clinical Specialist Acute Rehabilitation Services Pager [phone] Office [phone]  04/13/2021, 7:46 AM

## 2021-04-13 NOTE — Plan of Care (Signed)
Called by RN to notify that saturations are dropping to 90% on 10L NRB mask. Tachypneic to 30 breaths per minute.  Calling CCM for evaluation and management of respiratory decompensation.   Electronically signed: Dr. Kerney Elbe

## 2021-04-13 NOTE — Progress Notes (Signed)
[place] Progress Note Patient Name: Richard Summers DOB: [birth year] MRN: [id number]   Date of Service  04/13/2021  HPI/Events of Note  K+ 2.9  eICU Interventions  K+ replaced per modified E-Link adult electrolyte replacement protocol.        [name] [name] 04/13/2021, 11:55 PM

## 2021-04-13 NOTE — Progress Notes (Addendum)
  Speech Language Pathology Treatment: Dysphagia  Patient Details Name: Richard Summers MRN: [id number] DOB: 1940-11-20 Today's Date: 04/13/2021 Time: 4650-3546 SLP Time Calculation (min) (ACUTE ONLY): 7 min  Assessment / Plan / Recommendation Clinical Impression  [name] has decreased proprioceptive awareness, keeps eyes closed, head is tilted to his left. Attempts to self feed with eyes closed showing impulsivity. Continued behaviors after repetitive cueing. Left facial sensation loss yielded unsensed residue. RN tech reported impulsive and significantly rapid completion of container of nectar thick before she was able to intervene.  No cough or other indications of aspiration. Continue full supervision with meals, allow self feeding with tactile assist to control rate/volume with Dys 1/nectar.    HPI HPI: 80 yr old man  with history of epilepsy, Barrett's esophagus, GERD, HTN, MI s/p bypass, and OSA who had a fall in the shower with sudden left sided  hemiplegia with a fixed rt gaze. CT revealed right Basal Ganglia hemorrhage. CT repeated am 7/13 with right lentiform intra-axial hemorrhage has slightly enlarged since presentation.      SLP Plan  Continue with current plan of care       Recommendations  Diet recommendations: Dysphagia 1 (puree);Nectar-thick liquid Liquids provided via: Straw;Cup Medication Administration: Crushed with puree Supervision: Staff to assist with self feeding;Full supervision/cueing for compensatory strategies Compensations: Minimize environmental distractions;Slow rate;Small sips/bites;Lingual sweep for clearance of pocketing;Monitor for anterior loss Postural Changes and/or Swallow Maneuvers: Seated upright 90 degrees                General recommendations: Rehab consult Oral Care Recommendations: Oral care BID Follow up Recommendations: Inpatient Rehab SLP Visit Diagnosis: Dysphagia, oropharyngeal phase (R13.12) Plan: Continue with current plan of  care                      Houston Siren 04/13/2021, 4:20 PM Orbie Pyo Colvin Caroli.Ed Risk analyst [phone] Office [phone]

## 2021-04-13 NOTE — Consult Note (Signed)
NAME:  Richard Summers, MRN:  [id number], DOB:  06-30-1941, LOS: 2 ADMISSION DATE:  04/14/2021, CONSULTATION DATE:  7/14 REFERRING MD:  Dr. Cheral Marker, CHIEF COMPLAINT:  ICH   History of Present Illness:  Patient is encephalopathic and/or intubated. Therefore history has been obtained from chart review.   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  According to reports the patient's wife heard him fall while taking a shower and called EMS.  Upon their arrival he was unable to move his left side.  He was transported to [facility] emergency department as a code stroke and CT scan upon arrival demonstrated intercranial hemorrhage.  Cerebral edema also demonstrated on scan.  He was admitted to the neuro ICU for close monitoring and Cleviprex infusion for blood pressure goal less than 140 mmHg. He was started on hypertonic saline for worsening cerebral edema. 4/14 there was some concern for his airway. He was coughing after drinking nectar thick liquid and having upper airway secretions despite suctioning. When he became hypoxic requiring increased oxygen, PCCM was consulted.   Pertinent  Medical History   has a past medical history of Allergic rhinitis, Allergy, Barrett's esophagus, Cataract cortical, senile, Coronary artery disease involving coronary bypass graft of native heart, Epilepsy ([place]), GERD (gastroesophageal reflux disease), Glaucoma, Hyperlipidemia, Hypertension, Insomnia, MGUS (monoclonal gammopathy of unknown significance), Myocardial infarction ([place]), Nephrolithiasis, Osteoporosis, S/P triple vessel bypass, Sleep apnea, and Squamous cell carcinoma of skin (08/06/2019).   Significant Hospital Events: Including procedures, antibiotic start and stop dates in addition to other pertinent events   7/12 admitted with [place] 7/14 intubated for airway  protection.   Interim History / Subjective:    Objective   Blood pressure (!) 112/56, pulse 69, temperature (!) 101 F (38.3 C), temperature source Oral, resp. rate (!) 25, height 5\' 6"  (1.676 m), weight 74.8 kg, SpO2 95 %.    Vent Mode: PRVC FiO2 (%):  [100 %] 100 % Set Rate:  [18 bmp] 18 bmp Vt Set:  [510 mL] 510 mL PEEP:  [5 cmH20] 5 cmH20 Plateau Pressure:  [17 cmH20] 17 cmH20   Intake/Output Summary (Last 24 hours) at 04/13/2021 2245 Last data filed at 04/13/2021 0600 Gross per 24 hour  Intake 699.5 ml  Output 350 ml  Net 349.5 ml    Filed Weights   04/05/2021 1821  Weight: 74.8 kg    Examination: General: Elderly appearing male in NAD HENT: [place]/ AT, PERRL, no JVD Lungs: Coarse throughout, pooled upper airway secretions Cardiovascular: RRR, no MRG Abdomen: Soft, non-tender, non-distneded Extremities: No acute deformity.  Neuro: Awake, alert, answers yes/no questions. Weak on L upper > left lower.   Imaging reviewed by me: Ct head 7/12 > Acute parenchymal hemorrhage centered in the right basal ganglia region with mild edema and regional mass effect MRI brain 7/14 > Right lentiform intra-axial hemorrhage with estimated blood volume 40 mL is stable since yesterday. Susceptibility along the margins on DWI with no larger area of infarction. Stable intracranial mass effect with mild leftward midline shift.  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 Now complicated by cerebral edema.  - Stroke service primary.  - BP control < 174mmHg - Home HCTZ and losartan - Clevidipine  - Hypertonic saline - Na q 6 hours.  Acute hypoxemic respiratory failure secondary to aspiration. Aspirating throughout the day and then aspirated during intubation - Full vent support - VAP bundle - CXR - ABG - Levaquin for aspiration coverage (PCN allergy anaphylaxis)  Atrial  fibrillation: newly diagnosed - Telemetry monitoring - Not a candidate for [facility] at this time.   Seizure history - Continue home Keppra.   Hypokalemia - replace - follow BMP  Best Practice (right click and "Reselect all SmartList Selections" daily)   Diet/type: NPO DVT prophylaxis: not indicated GI prophylaxis: PPI Lines: N/A Foley:  N/A Code Status:  full code Last date of multidisciplinary goals of care discussion [ ]   Labs   CBC: Recent Labs  Lab 04/02/2021 1756 04/23/2021 1804 04/12/21 0817 04/13/21 0238  WBC 7.5  --  8.5 10.0  NEUTROABS 6.2  --   --   --   HGB 16.0 16.0 16.6 15.5  HCT 47.4 47.0 47.7 45.0  MCV 93.3  --  91.2 92.0  PLT 160  --  174 173     Basic Metabolic Panel: Recent Labs  Lab 04/15/2021 1756 04/10/2021 1804 04/12/21 0817 04/12/21 1528 04/12/21 2037 04/13/21 0238 04/13/21 0829 04/13/21 1352 04/13/21 2018  NA 140 141 137   < > 145 147* 151* 154* 155*  K 3.2* 3.1* 3.4*  --   --  3.2*  --   --   --   CL 105 105 103  --   --  116*  --   --   --   CO2 24  --  24  --   --  20*  --   --   --   GLUCOSE 176* 173* 130*  --   --  151*  --   --   --   BUN 27* 29* 15  --   --  18  --   --   --   CREATININE 1.02 0.90 0.75  --   --  1.12  --   --   --   CALCIUM 9.1  --  8.8*  --   --  8.5*  --   --   --    < > = values in this interval not displayed.    GFR: Estimated Creatinine Clearance: 48.3 mL/min (by C-G formula based on SCr of 1.12 mg/dL). Recent Labs  Lab 04/18/2021 1756 04/12/21 0817 04/13/21 0238  WBC 7.5 8.5 10.0     Liver Function Tests: Recent Labs  Lab 04/23/2021 1756  AST 22  ALT 20  ALKPHOS 59  BILITOT 1.8*  PROT 7.0  ALBUMIN 4.1    No results for input(s): LIPASE, AMYLASE in the last 168 hours. No results for input(s): AMMONIA in the last 168 hours.  ABG    Component Value Date/Time   TCO2 23 04/10/2021 1804      Coagulation Profile: Recent Labs  Lab 04/24/2021 1756  INR 1.0     Cardiac Enzymes: No results  for input(s): CKTOTAL, CKMB, CKMBINDEX, TROPONINI in the last 168 hours.  HbA1C: Hgb A1c MFr Bld  Date/Time Value Ref Range Status  04/13/2021 02:38 AM 6.2 (H) 4.8 - 5.6 % Final    Comment:    (NOTE) Pre diabetes:          5.7%-6.4%  Diabetes:              >6.4%  Glycemic control for   <7.0% adults with diabetes     CBG: Recent Labs  Lab 04/13/21 0334 04/13/21  2248 04/13/21 1159 04/13/21 1540 04/13/21 1924  GLUCAP 142* 165* 175* 254* 286*     Review of Systems:   Patient is encephalopathic and/or intubated. Therefore history has been obtained from chart review.    Past Medical History:  He,  has a past medical history of Allergic rhinitis, Allergy, Barrett's esophagus, Cataract cortical, senile, Coronary artery disease involving coronary bypass graft of native heart, Epilepsy ([place]), GERD (gastroesophageal reflux disease), Glaucoma, Hyperlipidemia, Hypertension, Insomnia, MGUS (monoclonal gammopathy of unknown significance), Myocardial infarction ([place]), Nephrolithiasis, Osteoporosis, S/P triple vessel bypass, Sleep apnea, and Squamous cell carcinoma of skin (08/06/2019).   Surgical History:   Past Surgical History:  Procedure Laterality Date   ANKLE SURGERY     broken leg     CARDIAC SURGERY     CORONARY ARTERY BYPASS GRAFT     triple   FRACTURE SURGERY     Fibula and Tibia. Plates and screws installed   TONSILLECTOMY     triple bypass     VASECTOMY       Social History:   reports that he quit smoking about 13 years ago. His smoking use included cigars and pipe. He has never used smokeless tobacco. He reports current alcohol use of about 7.0 standard drinks of alcohol per week. He reports that he does not use drugs.   Family History:  His family history includes Kidney cancer in his father; Seizures in his mother.   Allergies Allergies  Allergen Reactions   Penicillins Anaphylaxis, Hives and Other (See Comments)   Sulfa Antibiotics Anaphylaxis, Hives and  Other (See Comments)   Ezetimibe Other (See Comments)   Rosuvastatin Other (See Comments)   Statins     Other reaction(s): Other (See Comments) Confusion and mania      Home Medications  Prior to Admission medications   Medication Sig Start Date End Date Taking? Authorizing Provider  atorvastatin (LIPITOR) 10 MG tablet Take 10 mg by mouth daily. 03/26/21  Yes [provider]  azelastine (ASTELIN) 0.1 % nasal spray Place 1 spray into the nose daily.  03/30/19 04/14/2021 Yes [provider]  hydrochlorothiazide (HYDRODIURIL) 25 MG tablet Take 25 mg by mouth daily.  10/06/18  Yes [provider]  latanoprost (XALATAN) 0.005 % ophthalmic solution Place 1 drop into both eyes at bedtime.  02/23/19  Yes [provider]  levETIRAcetam (KEPPRA XR) 500 MG 24 hr tablet Take 2,000 mg by mouth daily.    Yes [provider]  Loratadine 10 MG CAPS Take 10 mg by mouth daily.    Yes [provider]  losartan (COZAAR) 100 MG tablet Take 100 mg by mouth daily.  02/09/19  Yes [provider]  montelukast (SINGULAIR) 10 MG tablet Take 10 mg by mouth daily.  02/09/19  Yes [provider]  pantoprazole (PROTONIX) 40 MG tablet Take 40 mg by mouth daily.  10/06/18  Yes [provider]  SYSTANE COMPLETE 0.6 % SOLN Apply 1 drop to eye at bedtime. 04/14/20  Yes [provider]  VASCEPA 0.5 g CAPS Take 2 capsules by mouth in the morning and at bedtime. 06/04/20  Yes [provider]  Vitamin D, Ergocalciferol, (DRISDOL) 1.25 MG (50000 UNIT) CAPS capsule Take 50,000 Units by mouth every Wednesday. 03/30/21  Yes [provider]     Critical care time: 39 minutes     Georgann Housekeeper, AGACNP-BC [place]  See Amion for personal pager PCCM on call pager [phone] until 7pm.  Please call Elink 7p-7a. [phone]  04/13/2021 10:45 PM

## 2021-04-13 NOTE — Progress Notes (Signed)
[facility] Progress Note Patient Name: Eldredge Veldhuizen DOB: [birth year] MRN: [id number]   Date of Service  04/13/2021  HPI/Events of Note  Patient needs a stat BMP to determine K+ replacement requirements.  eICU Interventions  Stat BMP ordered.        [name] Nejla Reasor 04/13/2021, 10:14 PM

## 2021-04-13 NOTE — Progress Notes (Signed)
New IV started in right upper arm and IV watch in place. 3% running again.

## 2021-04-13 NOTE — Evaluation (Signed)
Physical Therapy Evaluation Patient Details Name: Richard Summers MRN: [id number] DOB: 1941-08-03 Today's Date: 04/13/2021   History of Present Illness  Pt is a 80 y.o. male who presented 7/12 with L-sided weakness and pt falling. Pt found to have acute R lentiform intra-axial hemorrhage with estimated blood volume 40 mL and mild leftward midline shift, suspecting secondary to HTN. PMH: Barrett's esophagus, CAD, epilepsy, glaucoma, HTN, MGUS, MI, nephrolithiasis, osteoporosis, s/p triple vessel bypass, and squamous cell carcinoma of skin.   Clinical Impression  Pt presents with condition above and deficits mentioned below, see PT Problem List. PTA, he was independent and appeared to be the caregiver for his wife who has COPD. Pt reports being able to live on the main level of their house with 3 STE with bil handrails. Currently, pt is A&Ox4, but is lethargic and displays deficits in awareness, attention, sequencing, problem-solving, and command following. Pt pushes strongly with his R UE towards his L, weak side, even after repeated education and cues to correct it. In addition, pt displays minimal to no purposeful L-sided limb active movement to commands, but does keep his knees extended in sitting EOB and respond to painful stimuli at his L UE but not at his L lower extremity. Pt with deficits in L-sided strength, coordination, and sensation along with deficits in balance, activity tolerance, and cognition that place him at high risk for falls. He needed maxA for all bed mobility and to transfer to stand at ~85% upright posture this date. Will continue to follow acutely. Due to pt's significant functional decline, recommend follow-up with intensive therapy in the CIR setting to maximize his safety and independence with all functional mobility prior to return home.    Follow Up Recommendations CIR;Supervision/Assistance - 24 hour    Equipment Recommendations  Wheelchair (measurements PT);Wheelchair  cushion (measurements PT);Hospital bed;Other (comment) (possible hemi-walker, has RW)    Recommendations for Other Services Rehab consult     Precautions / Restrictions Precautions Precautions: Fall Precaution Comments: SBP < 140, pushing to L Restrictions Weight Bearing Restrictions: No      Mobility  Bed Mobility Overal bed mobility: Needs Assistance Bed Mobility: Supine to Sit;Sit to Supine;Rolling Rolling: Max assist   Supine to sit: Max assist;HOB elevated Sit to supine: Max assist   General bed mobility comments: Pt moving R leg towards EOB and needing hand-over-hand to find R bed rail with R hand. Assistance provided to ensure L UE and leg come with body with transition to sit EOB. Pt immediately began to push with R UE to his L, needing blocking. Cues provided to lean laterally onto R elbow to return to supine, maxA to complete and manage legs.    Transfers Overall transfer level: Needs assistance Equipment used: 1 person hand held assist Transfers: Sit to/from Stand Sit to Stand: Max assist         General transfer comment: Bil knee block, cuing pt to "hug" therapist anterior to him to encourage anterior and midline lean of trunk, but pt pushing further to L. MaxA to lift buttocks off EOB 2x, up to ~85% of full upright position before returning to sit.  Ambulation/Gait             General Gait Details: Unable this date.  Stairs            Wheelchair Mobility    Modified Rankin (Stroke Patients Only) Modified Rankin (Stroke Patients Only) Pre-Morbid Rankin Score: No symptoms Modified Rankin: Severe disability     Balance  Overall balance assessment: Needs assistance Sitting-balance support: Single extremity supported;Bilateral upper extremity supported;Feet unsupported Sitting balance-Leahy Scale: Poor Sitting balance - Comments: Pt pushing with R UE towards his L, keeping knees extended sitting up EOB despite cues to place feet on ground.  Needing min-maxA to prevent LOB, cuing pt to lean laterally onto R elbow to try to reduce pushing but pt would revert back to it once sitting up again. Postural control: Left lateral lean;Posterior lean Standing balance support: Bilateral upper extremity supported Standing balance-Leahy Scale: Zero Standing balance comment: MaxA and bil knee block and UE support to stand partially upright briefly.                             Pertinent Vitals/Pain Pain Assessment: No/denies pain Faces Pain Scale: No hurt    Home Living Family/patient expects to be discharged to:: Private residence Living Arrangements: Spouse/significant other Available Help at Discharge: Family Type of Home: House Home Access: Stairs to enter Entrance Stairs-Rails: Can reach both Entrance Stairs-Number of Steps: 3 Home Layout: Multi-level;Able to live on main level with bedroom/bathroom Home Equipment: Gilford Rile - 2 wheels;Bedside commode;Grab bars - tub/shower Additional Comments: Reports wife has medical complications due to COPD and is in hospital bed currently.    Prior Function Level of Independence: Independent         Comments: Pt appears to be the caregiver for his wife who has COPD.     Hand Dominance   Dominant Hand: Right    Extremity/Trunk Assessment   Upper Extremity Assessment Upper Extremity Assessment: LUE deficits/detail;RUE deficits/detail RUE Deficits / Details: Tremor throughout UE throughout session LUE Deficits / Details: Not actively moving L UE when cued; pulls away with painful stimuli, indicating some intact sensation and strength    Lower Extremity Assessment Lower Extremity Assessment: LLE deficits/detail LLE Deficits / Details: Maintains knee extension sitting EOB bil, but otherwise no pruposeful active movement of L leg when cued; no response to painful stimuli at L leg, indicating impaired sensation; incoordination LLE Sensation: decreased light touch;decreased  proprioception LLE Coordination: decreased gross motor;decreased fine motor    Cervical / Trunk Assessment Cervical / Trunk Assessment: Kyphotic (keeps neck rotated and flexed to L)  Communication   Communication: Expressive difficulties (hard to hear)  Cognition Arousal/Alertness: Lethargic Behavior During Therapy: Impulsive;Flat affect Overall Cognitive Status: Impaired/Different from baseline Area of Impairment: Attention;Memory;Following commands;Safety/judgement;Awareness;Problem solving                   Current Attention Level: Selective Memory: Decreased short-term memory Following Commands: Follows one step commands inconsistently;Follows one step commands with increased time Safety/Judgement: Decreased awareness of safety;Decreased awareness of deficits Awareness: Intellectual Problem Solving: Slow processing;Decreased initiation;Difficulty sequencing;Requires verbal cues;Requires tactile cues General Comments: Pt needing repeated multi-modal cues to perform tasks. Poor awareness into his deficits and safety, continuing to push self to his L despite repeated education to do otherwise. Pt lethargic, needing repeated cues to open eyes. L neglect.      General Comments General comments (skin integrity, edema, etc.): BP 126/75 start of session 122/57 end of session; SpO2 >/= 89%, needing cues to breathe in deeply or through nose with rapid rebound to mid-90s%; positioned pt with towel roll under L side of head to encourage midline alignment of head    Exercises     Assessment/Plan    PT Assessment Patient needs continued PT services  PT Problem List Decreased strength;Decreased range of motion;Decreased  activity tolerance;Decreased balance;Decreased mobility;Decreased coordination;Decreased cognition;Decreased knowledge of use of DME;Decreased safety awareness;Decreased knowledge of precautions;Impaired sensation       PT Treatment Interventions DME instruction;Stair  training;Gait training;Functional mobility training;Therapeutic activities;Therapeutic exercise;Balance training;Neuromuscular re-education;Cognitive remediation;Patient/family education;Wheelchair mobility training    PT Goals (Current goals can be found in the Care Plan section)  Acute Rehab PT Goals Patient Stated Goal: to get better PT Goal Formulation: With patient Time For Goal Achievement: 04/27/21 Potential to Achieve Goals: Good    Frequency Min 4X/week   Barriers to discharge        Co-evaluation               AM-PAC PT "6 Clicks" Mobility  Outcome Measure Help needed turning from your back to your side while in a flat bed without using bedrails?: A Lot Help needed moving from lying on your back to sitting on the side of a flat bed without using bedrails?: A Lot Help needed moving to and from a bed to a chair (including a wheelchair)?: A Lot Help needed standing up from a chair using your arms (e.g., wheelchair or bedside chair)?: A Lot Help needed to walk in hospital room?: Total Help needed climbing 3-5 steps with a railing? : Total 6 Click Score: 10    End of Session Equipment Utilized During Treatment: Oxygen Activity Tolerance: Patient limited by lethargy Patient left: in bed;with call bell/phone within reach;with bed alarm set;with SCD's reapplied Nurse Communication: Mobility status;Other (comment);Need for lift equipment (BP) PT Visit Diagnosis: Unsteadiness on feet (R26.81);Muscle weakness (generalized) (M62.81);Difficulty in walking, not elsewhere classified (R26.2);Other symptoms and signs involving the nervous system (R29.898);Hemiplegia and hemiparesis Hemiplegia - Right/Left: Left Hemiplegia - dominant/non-dominant: Non-dominant Hemiplegia - caused by: Nontraumatic intracerebral hemorrhage    Time: 2956-2130 PT Time Calculation (min) (ACUTE ONLY): 33 min   Charges:   PT Evaluation $PT Eval Moderate Complexity: 1 Mod PT Treatments $Therapeutic  Activity: 8-22 mins        Moishe Spice, PT, DPT Acute Rehabilitation Services  Pager: [phone] Office: [phone]   Orvan Falconer 04/13/2021, 6:01 PM

## 2021-04-13 NOTE — Progress Notes (Signed)
Critical ABG values reported to NP.

## 2021-04-13 NOTE — Progress Notes (Signed)
Rehab Admissions Coordinator Note:  Patient was screened by Cleatrice Burke for appropriateness for an Inpatient Acute Rehab Consult per therapy recs.   At this time, we are recommending Inpatient Rehab consult. I will place order per protocol.  Cleatrice Burke RN MSN 04/13/2021, 6:10 PM  I can be reached at [phone].

## 2021-04-13 NOTE — Evaluation (Signed)
Speech Language Pathology Evaluation Patient Details Name: Richard Summers MRN: [id number] DOB: [birth year] Today's Date: 04/13/2021 Time: 6195-0932 SLP Time Calculation (min) (ACUTE ONLY): 7 min  Problem List:  Patient Active Problem List   Diagnosis Date Noted   ICH (intracerebral hemorrhage) ([place]) 04/25/2021   Chronic allergic rhinitis 12/18/2019   Essential hypertension 12/18/2019   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[place]) 12/18/2019   Barrett's esophagus 12/18/2019   Glaucoma 12/18/2019   Insomnia 12/18/2019   Mixed hypercholesterolemia and hypertriglyceridemia 12/18/2019   Osteoporosis 12/18/2019   MGUS (monoclonal gammopathy of unknown significance) 07/06/2019   Ischemic cardiomyopathy 04/27/2019   Coronary artery disease involving coronary bypass graft of native heart 03/18/2019   Spinal stenosis of lumbar region 07/25/2017   Pain in left foot 02/14/2017   OSA on CPAP 04/09/2015   Prediabetes 04/09/2015   MR (mitral regurgitation) 04/20/2014   Thrombocytopenia ([place]) 06/18/2013   Past Medical History:  Past Medical History:  Diagnosis Date   Allergic rhinitis    Allergy    Barrett's esophagus    Cataract cortical, senile    Coronary artery disease involving coronary bypass graft of native heart    Epilepsy (HCC)    GERD (gastroesophageal reflux disease)    Glaucoma    Hyperlipidemia    Hypertension    Insomnia    MGUS (monoclonal gammopathy of unknown significance)    Myocardial infarction ([place])    Nephrolithiasis    Osteoporosis    S/P triple vessel bypass    Sleep apnea    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 Plates and screws installed   TONSILLECTOMY     triple bypass     VASECTOMY      HPI:  80 yr old man  with history of epilepsy, Barrett's esophagus, GERD, HTN, MI s/p bypass, and OSA who had a fall in the shower with sudden left sided  hemiplegia with a fixed rt gaze. CT revealed right Basal Ganglia hemorrhage. CT repeated am 7/13 with right lentiform intra-axial hemorrhage has slightly enlarged since presentation.   Assessment / Plan / Recommendation Clinical Impression  Pt presents with dysarthria and cognitive impairments in the areas of attention, awareness, working memory and problem solving. He demonstrates impulsivity during activities and significant left neglect. He was unable to retain information after 1-2 minutes and with repetition could recall partial infomation at end of evaluation. He is oriented x 4 and has little insight into current limitations. Increased rate, imprecise articulation result in reduced intelligibilty in phrases. ST will intervene on acute care and recommend further ST at next venue.    SLP Assessment  SLP Recommendation/Assessment: Patient needs continued Speech Lanaguage Pathology Services SLP Visit Diagnosis: Dysarthria and anarthria (R47.1);Cognitive communication deficit (R41.841)    Follow Up Recommendations  Inpatient Rehab    Frequency and Duration min 2x/week  2 weeks      SLP Evaluation Cognition  Overall Cognitive Status: Impaired/Different from baseline Arousal/Alertness:  (awake, eyes closed, sleepy) Orientation Level: Oriented to person;Oriented to time;Oriented to place;Oriented to situation Attention: Sustained Sustained Attention: Impaired Sustained Attention Impairment: Verbal basic;Functional basic Memory: Impaired Memory Impairment: Retrieval deficit;Storage deficit Awareness: Impaired Awareness Impairment:  Intellectual impairment;Emergent impairment;Anticipatory impairment Problem Solving: Impaired Problem Solving Impairment: Verbal basic;Functional basic Behaviors: Impulsive Safety/Judgment: Impaired        Comprehension  Auditory Comprehension Overall Auditory Comprehension: Appears within functional limits for tasks assessed Visual Recognition/Discrimination Discrimination: Not tested Reading Comprehension Reading Status:  (TBA)    Expression Expression Primary Mode of Expression: Verbal Verbal Expression Overall Verbal Expression: Appears within functional limits for tasks assessed Initiation: No impairment Naming: No impairment Pragmatics: Impairment Impairments: Abnormal affect;Eye contact Written Expression Dominant Hand: Right Written Expression: Not tested   Oral / Motor  Oral Motor/Sensory Function Overall Oral Motor/Sensory Function: Moderate impairment Facial ROM: Reduced left;Suspected CN VII (facial) dysfunction Facial Symmetry: Abnormal symmetry left;Suspected CN VII (facial) dysfunction Facial Strength: Reduced left;Suspected CN VII (facial) dysfunction Lingual ROM: Reduced right;Suspected CN XII (hypoglossal) dysfunction Lingual Symmetry: Abnormal symmetry left Motor Speech Overall Motor Speech: Impaired Respiration: Impaired Level of Impairment: Conversation Phonation: Low vocal intensity Resonance: Within functional limits Articulation: Impaired Level of Impairment: Phrase Intelligibility: Intelligibility reduced Word: 50-74% accurate Phrase: 25-49% accurate Sentence: 25-49% accurate Motor Planning: Witnin functional limits   GO                    Houston Siren 04/13/2021, 4:46 PM

## 2021-04-13 NOTE — Procedures (Signed)
Intubation Procedure Note  Richard Summers  [id number]  02-14-1941  Date:04/13/21  Time:9:24 PM   Provider Performing:Seong-Joo Carson Myrtle    Procedure: Intubation (11657)  Indication(s) Respiratory Failure  Consent Unable to obtain consent due to emergent nature of procedure.   Anesthesia Etomidate, Versed, and Rocuronium   Time Out Verified patient identification, verified procedure, site/side was marked, verified correct patient position, special equipment/implants available, medications/allergies/relevant history reviewed, required imaging and test results available.   Sterile Technique Usual hand hygeine, masks, and gloves were used   Procedure Description Patient positioned in bed supine.  Sedation given as noted above.  Patient was intubated with endotracheal tube using Glidescope.  View was Grade 1 full glottis .  Number of attempts was 1.  Colorimetric CO2 detector was consistent with tracheal placement.   Complications/Tolerance None; patient tolerated the procedure well. Chest X-ray is ordered to verify placement.   EBL None   Specimen(s) None   Renee Pain, MD Board Certified by the ABIM, [place]

## 2021-04-13 NOTE — Progress Notes (Signed)
At 0450 IV was noted to be infiltrated with 3% running. IV watch was on an monitoring the IV, it did not alarm me. The infiltration appeared in the opposite direction of where IV watch was placed. Aspirated the IV, pulled the IV out, applied cold packs, and elevated the arm. Attempted to notify neurology on call.

## 2021-04-13 NOTE — Progress Notes (Signed)
SLP Cancellation Note  Patient Details Name: Richard Summers MRN: [id number] DOB: 1941-09-19   Cancelled treatment:        Pt in MRI - will plan to check back as schedule permits today    Houston Siren 04/13/2021, 10:59 AM  Orbie Pyo Colvin Caroli.Ed Risk analyst [phone] Office [phone]

## 2021-04-13 NOTE — Progress Notes (Addendum)
STROKE TEAM PROGRESS NOTE   SUBJECTIVE (INTERVAL HISTORY) His RN is at the bedside.  Pt lying in bed, lethargic, barely open eyes with request however, able to answer questions with eyes closed and orientated x3.  Able to tell me that he takes care of his wife at home, his wife has terminal COPD using home oxygen and BiPAP.  PT/OT recommend CIR.  MRI shows stable hematoma with minimal midline shift.  Continued on 3% saline, sodium 154.  Still on Cleviprex, increase p.o. BP meds to taper off Cleviprex if able.    OBJECTIVE Temp:  [97.9 F (36.6 C)-99.6 F (37.6 C)] 99.6 F (37.6 C) (07/14 1600) Pulse Rate:  [55-90] 69 (07/14 1900) Cardiac Rhythm: Normal sinus rhythm (07/14 0800) Resp:  [22-41] 25 (07/14 1900) BP: (112-151)/(56-89) 112/56 (07/14 1900) SpO2:  [84 %-96 %] 91 % (07/14 1900)  Recent Labs  Lab 04/13/21 0334 04/13/21 0733 04/13/21 1159 04/13/21 1540 04/13/21 1924  GLUCAP 142* 165* 175* 254* 286*   Recent Labs  Lab 04/25/2021 1756 04/30/2021 1804 04/12/21 0817 04/12/21 1528 04/12/21 2037 04/13/21 0238 04/13/21 0829 04/13/21 1352  NA 140 141 137 141 145 147* 151* 154*  K 3.2* 3.1* 3.4*  --   --  3.2*  --   --   CL 105 105 103  --   --  116*  --   --   CO2 24  --  24  --   --  20*  --   --   GLUCOSE 176* 173* 130*  --   --  151*  --   --   BUN 27* 29* 15  --   --  18  --   --   CREATININE 1.02 0.90 0.75  --   --  1.12  --   --   CALCIUM 9.1  --  8.8*  --   --  8.5*  --   --    Recent Labs  Lab 04/16/2021 1756  AST 22  ALT 20  ALKPHOS 59  BILITOT 1.8*  PROT 7.0  ALBUMIN 4.1   Recent Labs  Lab 04/22/2021 1756 04/25/2021 1804 04/12/21 0817 04/13/21 0238  WBC 7.5  --  8.5 10.0  NEUTROABS 6.2  --   --   --   HGB 16.0 16.0 16.6 15.5  HCT 47.4 47.0 47.7 45.0  MCV 93.3  --  91.2 92.0  PLT 160  --  174 173   No results for input(s): CKTOTAL, CKMB, CKMBINDEX, TROPONINI in the last 168 hours. Recent Labs    04/27/2021 1756  LABPROT 12.9  INR 1.0   Recent Labs     04/12/21 0106  COLORURINE YELLOW  LABSPEC 1.019  PHURINE 7.0  GLUCOSEU NEGATIVE  HGBUR NEGATIVE  BILIRUBINUR NEGATIVE  KETONESUR 20*  PROTEINUR NEGATIVE  NITRITE NEGATIVE  LEUKOCYTESUR NEGATIVE       Component Value Date/Time   CHOL 239 (H) 04/13/2021 0238   TRIG 88 04/13/2021 0238   HDL 72 04/13/2021 0238   CHOLHDL 3.3 04/13/2021 0238   VLDL 18 04/13/2021 0238   LDLCALC 149 (H) 04/13/2021 0238   Lab Results  Component Value Date   HGBA1C 6.2 (H) 04/13/2021      Component Value Date/Time   LABOPIA NONE DETECTED 04/12/2021 0107   COCAINSCRNUR NONE DETECTED 04/12/2021 0107   LABBENZ NONE DETECTED 04/12/2021 0107   AMPHETMU NONE DETECTED 04/12/2021 0107   THCU NONE DETECTED 04/12/2021 0107   LABBARB NONE DETECTED 04/12/2021 0107  No results for input(s): ETH in the last 168 hours.  I have personally reviewed the radiological images below and agree with the radiology interpretations.  CT ANGIO HEAD W OR WO CONTRAST  Result Date: 04/12/2021 CLINICAL DATA:  80 year old male code stroke presentation on 04/15/2021 with right lentiform intra-axial hemorrhage. Subsequent encounter. EXAM: CT ANGIOGRAPHY HEAD AND NECK TECHNIQUE: Multidetector CT imaging of the head and neck was performed using the standard protocol during bolus administration of intravenous contrast. Multiplanar CT image reconstructions and MIPs were obtained to evaluate the vascular anatomy. Carotid stenosis measurements (when applicable) are obtained utilizing NASCET criteria, using the distal internal carotid diameter as the denominator. CONTRAST:  28mL OMNIPAQUE IOHEXOL 350 MG/ML SOLN COMPARISON:  Head CT 04/14/2021. FINDINGS: CT HEAD Brain: Calcified atherosclerosis at the skull base. Hyperdense intra-axial hemorrhage centered at the right lentiform encompasses 62 by 28 x 49 mm (AP by transverse by CC) for an estimated blood volume of 43 mL, and has mildly extended since presentation (approximately 31 mL at  that time). But surrounding edema and regional mass effect have not significantly changed. Partially effaced right lateral ventricle with no ventriculomegaly. No intraventricular or extra-axial extension of blood. No significant midline shift. Basilar cisterns remain patent. Stable gray-white matter differentiation elsewhere. 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 Calvarium and skull base: Intact, negative. Paranasal sinuses: Continued paranasal sinus opacification maximal in the ethmoids. Tympanic cavities and mastoids remain clear. Orbits: Visualized orbits and scalp soft tissues are within normal limits. Negative noncontrast deep soft tissue spaces of the face. CTA NECK Skeleton: Right side TMJ degeneration. Widespread cervical spine degeneration. No acute or suspicious osseous lesion identified. Upper chest: Mild upper lobe lung scarring mostly on the left. Retained secretions, bubbly opacity in the visible trachea. No superior mediastinal lymphadenopathy. Other neck: Negative.  No neck mass or lymphadenopathy. Aortic arch: Extensive soft and calcified arch atherosclerosis. Three vessel arch configuration. Great vessel origins relatively spared. Right carotid system: Mild brachiocephalic artery and right CCA origin atherosclerosis without stenosis. Mildly tortuous right CCA. Mostly calcified plaque at the right carotid bifurcation and posterior right ICA bulb without stenosis. Mildly tortuous ICA to the skull base. Left carotid system: Minimal plaque at the left CCA origin without stenosis. Soft plaque in the medial vessel at the level of the larynx on series 5, image 131 without stenosis. Minor plaque at the left ICA origin without stenosis. Mildly tortuous left ICA to the skull base. Vertebral arteries: Soft and calcified plaque in the proximal right subclavian artery without stenosis. Right vertebral artery origin remains normal. Tortuous right V1 and proximal V2 segments. Patent right vertebral  artery to the skull base without significant plaque or stenosis. Soft and calcified plaque in the proximal left subclavian artery without stenosis. Mild involvement of the left vertebral artery origin without stenosis. Tortuous left V1 and proximal V2 segments. Codominant left vertebral artery is patent to the skull base without significant plaque or stenosis. CTA HEAD Posterior circulation: Codominant distal vertebral arteries with mild calcified plaque mostly on the left. No significant stenosis. Patent PICA origins and vertebrobasilar junction. Patent basilar artery with mild irregularity but no significant stenosis. Patent SCA and right PCA origins. Fetal left PCA origin. Right posterior communicating artery diminutive or absent. Bilateral P2 segments are mildly irregular without stenosis. But there is bilateral P3 segment stenosis, mild to moderate involving the inferior division on the left (series 10, image 31) and moderate to severe involving the right P3 trunk on series 10, image 26.  Anterior circulation: Both ICA siphons are patent. Abundant siphon calcified plaque on both sides but no significant stenosis on the left. There is moderate stenosis of the right supraclinoid ICA on series 7, image 93. Normal left posterior communicating artery. Patent carotid termini. MCA and ACA origins are within normal limits. No convincing CTA spot sign associated with the right lentiform hemorrhage. Mildly dominant and tortuous right ACA A1 segment. Mild left A1 irregularity without stenosis. 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 Otherwise the bilateral ACA branches are within normal limits. Left MCA M1 segment and bifurcation are patent without stenosis. Left MCA branches are within normal limits. Right MCA M1 segment and bifurcation are patent without stenosis. Right MCA branches are within normal limits. Venous sinuses: Early contrast  timing but grossly patent. Anatomic variants: Fetal left PCA origin. Mildly dominant right ACA A1. Review of the MIP images confirms the above findings IMPRESSION: 1. Right lentiform intra-axial hemorrhage has slightly enlarged since presentation, estimated blood volume now 43 mL (previously 31 mL). But there is no CTA spot sign, and mild regional edema and mass effect are stable. 2. 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 There is up to Moderate stenosis of the Right supraclinoid ICA due to calcified plaque. And there is Moderate To Severe bilateral PCA P3 segment stenosis. No significant Vertebrobasilar or cervical carotid stenosis. 3. Aortic Atherosclerosis (ICD10-I70.0). Electronically Signed   By: Genevie Ann M.D.   On: 04/12/2021 06:27   MR BRAIN WO CONTRAST  Result Date: 04/13/2021 CLINICAL DATA:  80 year old male code stroke presentation on 04/10/2021 with right lentiform intra-axial hemorrhage. EXAM: MRI HEAD WITHOUT CONTRAST TECHNIQUE: Multiplanar, multiecho pulse sequences of the brain and surrounding structures were obtained without intravenous contrast. COMPARISON:  CTA head and neck 04/12/2021 and earlier. FINDINGS: Brain: Mix signal intra-axial hemorrhage centered at the right lentiform encompasses 68 by 26 by 45 mm (AP by transverse by CC) for an estimated blood volume a 40 mL, stable since 04/12/2021. Surrounding edema. Susceptibility artifact along the margins of the hematoma on DWI. No larger area of restricted diffusion surrounding the blood. No restricted diffusion elsewhere. Stable mass effect on the right lateral ventricle. No intraventricular extension. No extra-axial extension. Mild leftward midline shift of 3-4 mm is stable. No ventriculomegaly. Basilar cisterns remain patent. No chronic cerebral blood products elsewhere on T2* imaging. 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 No cortical encephalomalacia. Brainstem and cerebellum appear negative. Cervicomedullary junction and pituitary are within normal limits. Vascular: Major intracranial vascular flow voids are preserved. Skull and upper cervical spine: Negative visible cervical spine. Visualized bone marrow signal is within normal limits. Sinuses/Orbits: Negative orbits. Moderate ethmoid and sphenoid sinus mucosal thickening. Mild maxillary sinus involvement. Other: Mastoids are clear. Visible internal auditory structures appear normal. Negative visible scalp and face. IMPRESSION: 1. Right lentiform intra-axial hemorrhage with estimated blood volume 40 mL is stable since yesterday. Susceptibility along the margins on DWI with no larger area of infarction. Stable intracranial mass effect with mild leftward midline shift. 2. No underlying chronic cerebral blood products, but there are contralateral chronic lacunar infarcts in the left basal ganglia. Electronically Signed   By: Genevie Ann M.D.   On: 04/13/2021 11:50   DG CHEST PORT 1 VIEW  Result Date: 04/12/2021  CLINICAL DATA:  80 year old male with intracranial hemorrhage. Hypoxia. EXAM: PORTABLE CHEST 1 VIEW COMPARISON:  Chest radiographs 08/28/2006. CTA neck today reported separately. FINDINGS: Portable AP semi upright view at 0415 hours. Mildly lower lung volumes compared to 2007. Normal cardiac size and mediastinal contours. Visualized tracheal air column is within normal limits. Allowing for portable technique the lungs are clear. No pneumothorax or pleural effusion. Chronic left anterior rib cerclage wires are stable. Paucity of bowel gas in the upper abdomen. IMPRESSION: No acute cardiopulmonary abnormality. Electronically Signed   By: Genevie Ann M.D.   On: 04/12/2021 06:29   DG Swallowing Func-Speech Pathology  Result Date: 04/12/2021 Formatting of this result is different from the  original. 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 Plates and screws installed  TONSILLECTOMY    triple bypass    VASECTOMY   HPI: 80 yr old man  with history of epilepsy, Barrett's esophagus, GERD, HTN, MI s/p bypass, and OSA who had a fall in the shower with sudden left sided  hemiplegia with a fixed rt gaze. CT revealed right Basal Ganglia hemorrhage. CT repeated am 7/13 with right lentiform intra-axial hemorrhage has slightly enlarged since presentation.  No data recorded Assessment / Plan / Recommendation CHL IP CLINICAL IMPRESSIONS 04/12/2021 Clinical Impression Oral phase of swallow marked by decreased cohesion, ROM and manipulation resulted in anterior spill, left lateral sulci residue, lingual residue, holding and transit delays. Lingual residue spilled to valleculae and cleared with cues and spontaneous swallows. Pharyngeal and laryngeal mobility and strength were adequate once swallow was initiated. Poor timing  to close larynx occured with thin liquid barium resulting in incomplete laryngeal closure and aspiration with reflexive cough (PAS 7) unable to clear trachea. Pt was very restless, pushing to his left, distracted and therapist unable to attempt compensatory strategies during study. Recommend Dys 1 (puree), nectar thick liquids, crush pills, check left buccal cavity for pocketing, limit distractions, straws allowed and full supervision and assist. SLP Visit Diagnosis Dysphagia, oropharyngeal phase (R13.12) Attention and concentration deficit following -- Frontal lobe and executive function deficit following -- Impact on safety and function Moderate aspiration risk;Mild aspiration risk   CHL IP TREATMENT RECOMMENDATION 04/12/2021 Treatment Recommendations Therapy as outlined in treatment plan below   Prognosis 04/12/2021 Prognosis for Safe Diet Advancement Good Barriers to Reach Goals Cognitive deficits Barriers/Prognosis Comment -- CHL IP DIET RECOMMENDATION 04/12/2021 SLP Diet Recommendations Dysphagia 1 (Puree) solids;Nectar thick liquid Liquid Administration via [employer]  Medication Administration Crushed with puree Compensations Minimize environmental distractions;Slow rate;Small sips/bites;Lingual sweep for clearance of pocketing;Monitor for anterior loss Postural Changes Seated upright at 90 degrees   CHL IP OTHER RECOMMENDATIONS 04/12/2021 Recommended Consults -- Oral Care Recommendations Oral care BID Other Recommendations --   CHL IP FOLLOW UP RECOMMENDATIONS 04/12/2021 Follow up Recommendations Inpatient Rehab   CHL IP FREQUENCY AND DURATION 04/12/2021 Speech Therapy Frequency (ACUTE ONLY) min 2x/week Treatment Duration 2 weeks      CHL IP ORAL PHASE 04/12/2021 Oral Phase Impaired Oral - Pudding Teaspoon -- Oral - Pudding Cup -- Oral - Honey Teaspoon -- Oral - Honey Cup -- Oral - Nectar Teaspoon Left anterior bolus loss;Reduced posterior propulsion;Decreased bolus cohesion;Left pocketing in lateral  sulci;Lingual/palatal residue Oral - Nectar Cup Left anterior bolus loss;Reduced posterior propulsion;Decreased bolus cohesion;Left pocketing in lateral sulci;Lingual/palatal residue Oral - Nectar Straw Reduced posterior propulsion;Delayed oral transit;Lingual/palatal residue Oral - Thin Teaspoon Left anterior bolus loss;Reduced posterior propulsion;Decreased bolus cohesion;Holding of bolus Oral - Thin Cup Left anterior bolus loss;Reduced posterior propulsion;Decreased bolus cohesion;Holding of bolus Oral - Thin Straw -- Oral - Puree Holding of bolus;Lingual/palatal residue;Delayed oral transit Oral - Mech Soft -- Oral - Regular Left pocketing in lateral sulci;Delayed oral transit Oral - Multi-Consistency -- Oral - Pill -- Oral Phase - Comment --  CHL IP PHARYNGEAL PHASE 04/12/2021 Pharyngeal Phase Impaired Pharyngeal- Pudding Teaspoon -- Pharyngeal -- Pharyngeal- Pudding Cup -- Pharyngeal -- Pharyngeal- Honey Teaspoon -- Pharyngeal -- Pharyngeal- Honey Cup -- Pharyngeal -- Pharyngeal- Nectar Teaspoon WFL Pharyngeal -- Pharyngeal- Nectar Cup Pharyngeal residue - valleculae Pharyngeal -- Pharyngeal- Nectar Straw WFL Pharyngeal -- Pharyngeal- Thin Teaspoon WFL Pharyngeal -- Pharyngeal- Thin Cup Penetration/Aspiration during swallow Pharyngeal Material enters airway, passes BELOW cords and not ejected out despite cough attempt by patient Pharyngeal- Thin Straw -- Pharyngeal -- Pharyngeal- Puree -- Pharyngeal -- Pharyngeal- Mechanical Soft -- Pharyngeal -- Pharyngeal- Regular -- Pharyngeal -- Pharyngeal- Multi-consistency -- Pharyngeal -- Pharyngeal- Pill -- Pharyngeal -- Pharyngeal Comment --  No flowsheet data found. [place] Siren 04/12/2021, 2:31 PM              ECHOCARDIOGRAM COMPLETE  Result Date: 04/12/2021    ECHOCARDIOGRAM REPORT   Patient Name:   YOUSSOUF SHIPLEY Date of Exam: 04/12/2021 Medical Rec #:  [id number]      Height:       66.0 in Accession #:    [id number]     Weight:       164.9 lb Date of  Birth:  1941/04/22      BSA:          1.842 m Patient Age:    80 years       BP:           146/75 mmHg Patient Gender: M              HR:           80 bpm. Exam Location:  Inpatient Procedure: 2D Echo, Cardiac Doppler and Color Doppler Indications:    Stroke I63.9  History:        Patient has no prior history of Echocardiogram examinations. CAD                 and Previous Myocardial Infarction; Risk Factors:Dyslipidemia                 and Hypertension.  Sonographer:    Bernadene Person RDCS Referring Phys: [id number] Valeria  1. Left ventricular  ejection fraction, by estimation, is 45%. The left ventricle demonstrates regional wall motion abnormalities with basal inferior akinesis, basal to mid inferolateral akinesis, basal anterolateral hypokinesis. There is mild left ventricular hypertrophy. Left ventricular diastolic parameters are consistent with Grade I diastolic dysfunction (impaired relaxation).  2. Right ventricular systolic function is normal. The right ventricular size is normal. Tricuspid regurgitation signal is inadequate for assessing PA pressure.  3. Left atrial size was mildly dilated.  4. The mitral valve is normal in structure. Trivial mitral valve regurgitation. No evidence of mitral stenosis.  5. The aortic valve is tricuspid. Aortic valve regurgitation is not visualized. Mild aortic valve sclerosis is present, with no evidence of aortic valve stenosis. FINDINGS  Left Ventricle: Left ventricular ejection fraction, by estimation, is 45%. The left ventricle has mildly decreased function. The left ventricle demonstrates regional wall motion abnormalities. The left ventricular internal cavity size was normal in size. There is mild left ventricular hypertrophy. Left ventricular diastolic parameters are consistent with Grade I diastolic dysfunction (impaired relaxation). Right Ventricle: The right ventricular size is normal. No increase in right ventricular wall thickness. Right  ventricular systolic function is normal. Tricuspid regurgitation signal is inadequate for assessing PA pressure. Left Atrium: Left atrial size was mildly dilated. Right Atrium: Right atrial size was normal in size. Pericardium: There is no evidence of pericardial effusion. Mitral Valve: The mitral valve is normal in structure. Mild mitral annular calcification. Trivial mitral valve regurgitation. No evidence of mitral valve stenosis. Tricuspid Valve: The tricuspid valve is normal in structure. Tricuspid valve regurgitation is not demonstrated. Aortic Valve: The aortic valve is tricuspid. Aortic valve regurgitation is not visualized. Mild aortic valve sclerosis is present, with no evidence of aortic valve stenosis. Pulmonic Valve: The pulmonic valve was normal in structure. Pulmonic valve regurgitation is not visualized. Aorta: The aortic root is normal in size and structure. Venous: The inferior vena cava was not well visualized. IAS/Shunts: No atrial level shunt detected by color flow Doppler.  LEFT VENTRICLE PLAX 2D LVIDd:         3.90 cm LVIDs:         2.70 cm LV PW:         1.10 cm LV IVS:        1.00 cm LVOT diam:     2.30 cm LV SV:         74 LV SV Index:   40 LVOT Area:     4.15 cm  RIGHT VENTRICLE RV S prime:     12.50 cm/s TAPSE (M-mode): 1.7 cm LEFT ATRIUM             Index       RIGHT ATRIUM           Index LA diam:        5.00 cm 2.71 cm/m  RA Area:     17.70 cm LA Vol (A2C):   55.0 ml 29.85 ml/m RA Volume:   51.20 ml  27.79 ml/m LA Vol (A4C):   59.6 ml 32.35 ml/m LA Biplane Vol: 60.6 ml 32.89 ml/m  AORTIC VALVE LVOT Vmax:   97.13 cm/s LVOT Vmean:  61.600 cm/s LVOT VTI:    0.178 m  AORTA Ao Root diam: 3.70 cm Ao Asc diam:  3.00 cm  SHUNTS Systemic VTI:  0.18 m Systemic Diam: 2.30 cm Loralie Champagne MD Electronically signed by Loralie Champagne MD Signature Date/Time: 04/12/2021/3:29:37 PM    Final    CT HEAD CODE STROKE WO CONTRAST  Result Date: 04/02/2021 CLINICAL DATA:  Code stroke. EXAM: CT HEAD  WITHOUT CONTRAST TECHNIQUE: Contiguous axial images were obtained from the base of the skull through the vertex without intravenous contrast. COMPARISON:  None. FINDINGS: Brain: Acute parenchymal hemorrhage is present centered in the right basal ganglia region with extension into the corona radiata superiorly and temporal lobe inferiorly. Mild surrounding edema. Regional mass effect is present with partial effacement of the right lateral ventricle. No substantial midline shift. No intraventricular extension. No hydrocephalus. Gray-white differentiation is preserved. 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 Additional patchy low-attenuation in the supratentorial white matter probably reflects chronic microvascular ischemic changes. Vascular: There is intracranial atherosclerotic calcification at the skull base. Skull: Unremarkable. Sinuses/Orbits: Ethmoid dominant mucosal thickening. Orbits are unremarkable. Other: Mastoid air cells are clear. IMPRESSION: Acute parenchymal hemorrhage centered in the right basal ganglia region with mild edema and regional mass effect. No intraventricular extension. These results were communicated to Dr. Curly Shores at 6:11 pm on 04/12/2021 by text page via the [facility] messaging system. Electronically Signed   By: Macy Mis M.D.   On: 04/10/2021 18:15   CT ANGIO NECK CODE STROKE  Result Date: 04/12/2021 CLINICAL DATA:  80 year old male code stroke presentation on 04/10/2021 with right lentiform intra-axial hemorrhage. Subsequent encounter. EXAM: CT ANGIOGRAPHY HEAD AND NECK TECHNIQUE: Multidetector CT imaging of the head and neck was performed using the standard protocol during bolus administration of intravenous contrast. Multiplanar CT image reconstructions and MIPs were obtained to evaluate the vascular anatomy. Carotid stenosis measurements (when applicable) are obtained utilizing NASCET criteria, using the distal internal carotid diameter as the denominator.  CONTRAST:  39mL OMNIPAQUE IOHEXOL 350 MG/ML SOLN COMPARISON:  Head CT 04/18/2021. FINDINGS: CT HEAD Brain: Calcified atherosclerosis at the skull base. Hyperdense intra-axial hemorrhage centered at the right lentiform encompasses 62 by 28 x 49 mm (AP by transverse by CC) for an estimated blood volume of 43 mL, and has mildly extended since presentation (approximately 31 mL at that time). But surrounding edema and regional mass effect have not significantly changed. Partially effaced right lateral ventricle with no ventriculomegaly. No intraventricular or extra-axial extension of blood. No significant midline shift. Basilar cisterns remain patent. Stable gray-white matter differentiation elsewhere. 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 Calvarium and skull base: Intact, negative. Paranasal sinuses: Continued paranasal sinus opacification maximal in the ethmoids. Tympanic cavities and mastoids remain clear. Orbits: Visualized orbits and scalp soft tissues are within normal limits. Negative noncontrast deep soft tissue spaces of the face. CTA NECK Skeleton: Right side TMJ degeneration. Widespread cervical spine degeneration. No acute or suspicious osseous lesion identified. Upper chest: Mild upper lobe lung scarring mostly on the left. Retained secretions, bubbly opacity in the visible trachea. No superior mediastinal lymphadenopathy. Other neck: Negative.  No neck mass or lymphadenopathy. Aortic arch: Extensive soft and calcified arch atherosclerosis. Three vessel arch configuration. Great vessel origins relatively spared. Right carotid system: Mild brachiocephalic artery and right CCA origin atherosclerosis without stenosis. Mildly tortuous right CCA. Mostly calcified plaque at the right carotid bifurcation and posterior right ICA bulb without stenosis. Mildly tortuous ICA to the skull base. Left carotid system: Minimal plaque at the left CCA origin without stenosis. Soft plaque in the medial vessel at  the level of the larynx on series 5, image 131 without stenosis. Minor plaque at the left ICA origin without stenosis. Mildly tortuous left ICA to the skull base. Vertebral arteries: Soft and calcified plaque in the proximal right subclavian artery  without stenosis. Right vertebral artery origin remains normal. Tortuous right V1 and proximal V2 segments. Patent right vertebral artery to the skull base without significant plaque or stenosis. Soft and calcified plaque in the proximal left subclavian artery without stenosis. Mild involvement of the left vertebral artery origin without stenosis. Tortuous left V1 and proximal V2 segments. Codominant left vertebral artery is patent to the skull base without significant plaque or stenosis. CTA HEAD Posterior circulation: Codominant distal vertebral arteries with mild calcified plaque mostly on the left. No significant stenosis. Patent PICA origins and vertebrobasilar junction. Patent basilar artery with mild irregularity but no significant stenosis. Patent SCA and right PCA origins. Fetal left PCA origin. Right posterior communicating artery diminutive or absent. Bilateral P2 segments are mildly irregular without stenosis. But there is bilateral P3 segment stenosis, mild to moderate involving the inferior division on the left (series 10, image 31) and moderate to severe involving the right P3 trunk on series 10, image 26. Anterior circulation: Both ICA siphons are patent. Abundant siphon calcified plaque on both sides but no significant stenosis on the left. There is moderate stenosis of the right supraclinoid ICA on series 7, image 93. Normal left posterior communicating artery. Patent carotid termini. MCA and ACA origins are within normal limits. No convincing CTA spot sign associated with the right lentiform hemorrhage. Mildly dominant and tortuous right ACA A1 segment. Mild left A1 irregularity without stenosis. Anterior communicating artery small anteriorly directed  saccular aneurysm is 1-2 mm and best seen on series 6, image 109. Otherwise the bilateral ACA branches are within normal limits. Left MCA M1 segment and bifurcation are patent without stenosis. Left MCA branches are within normal limits. Right MCA M1 segment and bifurcation are patent without stenosis. Right MCA branches are within normal limits. Venous sinuses: Early contrast timing but grossly patent. Anatomic variants: Fetal left PCA origin. Mildly dominant right ACA A1. Review of the MIP images confirms the above findings IMPRESSION: 1. Right lentiform intra-axial hemorrhage has slightly enlarged since presentation, estimated blood volume now 43 mL (previously 31 mL). But there is no CTA spot sign, and mild regional edema and mass effect are stable. 2. 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 There is up to Moderate stenosis of the Right supraclinoid ICA due to calcified plaque. And there is Moderate To Severe bilateral PCA P3 segment stenosis. No significant Vertebrobasilar or cervical carotid stenosis. 3. Aortic Atherosclerosis (ICD10-I70.0). Electronically Signed   By: Genevie Ann M.D.   On: 04/12/2021 06:27      PHYSICAL EXAM  Temp:  [97.9 F (36.6 C)-99.6 F (37.6 C)] 99.6 F (37.6 C) (07/14 1600) Pulse Rate:  [55-90] 69 (07/14 1900) Resp:  [22-41] 25 (07/14 1900) BP: (112-151)/(56-89) 112/56 (07/14 1900) SpO2:  [84 %-96 %] 91 % (07/14 1900)  General - Well nourished, well developed, lethargic and sleepy.  Ophthalmologic - fundi not visualized due to noncooperation.  Cardiovascular - irregularly irregular heart rate and rhythm.  Neuro - lethargic, drowsy and sleepy but able to open eyes with repetitive stimulation, orientated to age, place, month, but not to year. No aphasia, able to answer questions appropriately, following all simple commands. Able to name and repeat. Right forced gaze, not blinking to visual threat on the right,  right hemianopia versus visual neglect, PERRL. Left facial droop. Tongue midline. Right UEs 3/5, left UE flaccid. RLE 2/5 on pain stimulation. LLE slight toe movement to pain. Sensation seems to have sensory loss  on the left with sensory neglect but not quite cooperative with exam, right FTN noncooperative, gait not tested.    ASSESSMENT/PLAN Richard Summers is a 80 y.o. male with history of hypertension, hyperlipidemia, MI status post CABG x3, OSA, seizure on Keppra admitted for left-sided weakness, left neglect, fall. No tPA given due to [place].    ICH:  right BG ICH likely hypertensive etiology.  However, given new diagnosed A. fib, hemorrhagic conversion is also in DDx. CT head right BG ICH CTA head and neck moderate stenosis of the right supraclinoid ICA, moderate to severe bilateral P3.  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 CT repeat showed slight increase of right BG hemorrhage, no CT spot sign, mild regional edema and mass-effect MRI stable right BG hematoma, minimal midline shift 2D Echo EF 45% LDL 149 HgbA1c 6.2 SCDs for VTE prophylaxis No antithrombotic prior to admission, now on No antithrombotic due to [place] Ongoing aggressive stroke risk factor management Therapy recommendations: Pending Disposition: Pending  Cerebral edema CT head repeat showed slight increase of right BG ICH, mild regional edema and mass-effect Given the size of ICH, put on 3% saline Na goal 150-155 Na 137->141->145->151->154 3% saline @ 50->75->50 MRI stable right BG hematoma, minimal midline shift  New diagnosed A. fib EKG showed A. Fib Telemetry continued A. Fib Rate controlled Not AC candidate  History of seizure On home Keppra XR 2000 daily Continue Keppra 1 g twice daily  Hypertension Stable on Cleviprex BP goal < 160 now Resume home HCTZ and losartan Add amlodipine and hydralazine Taper off Cleviprex as able Long term BP goal normotensive  Hyperlipidemia Home meds: Lipitor  10 LDL 149, goal < 70 Hold off Lipitor for now Consider to resume statin at discharge  Dysphagia Passed barium study  On dysphagia 1 with nectar thick liquid Speech on board  Other Stroke Risk Factors Advanced age Coronary artery disease/MI status post CABG x3 Obstructive sleep apnea, on CPAP at home  Other Active Problems MGUS Hypokalemia, K 3.2-> supplement   Hospital day # 2  This patient is critically ill due to large right BG ICH, cerebral edema, new diagnosed A. fib, hypertensive emergency and at significant risk of neurological worsening, death form hematoma expansion, brain herniation, seizure, heart failure, aspiration, encephalopathy. This patient's care requires constant monitoring of vital signs, hemodynamics, respiratory and cardiac monitoring, review of multiple databases, neurological assessment, discussion with family, other specialists and medical decision making of high complexity. I spent 40 minutes of neurocritical care time in the care of this patient.   Rosalin Hawking, MD PhD Stroke Neurology 04/13/2021 7:51 PM    To contact Stroke Continuity provider, please refer to [email or web address] After hours, contact General Neurology

## 2021-04-13 NOTE — H&P (Deleted)
NAME:  Richard Summers, MRN:  [id number], DOB:  May 25, 1941, LOS: 2 ADMISSION DATE:  04/22/2021, CONSULTATION DATE:  7/14 REFERRING MD:  Dr. Cheral Marker, CHIEF COMPLAINT:  ICH   History of Present Illness:  Patient is encephalopathic and/or intubated. Therefore history has been obtained from chart review.   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  According to reports the patient's wife heard him fall while taking a shower and called EMS.  Upon their arrival he was unable to move his left side.  He was transported to [facility] emergency department as a code stroke and CT scan upon arrival demonstrated intercranial hemorrhage.  Cerebral edema also demonstrated on scan.  He was admitted to the neuro ICU for close monitoring and Cleviprex infusion for blood pressure goal less than 140 mmHg. He was started on hypertonic saline for worsening cerebral edema. 4/14 there was some concern for his airway. He was coughing after drinking nectar thick liquid and having upper airway secretions despite suctioning. When he became hypoxic requiring increased oxygen, PCCM was consulted.   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   Significant Hospital Events: Including procedures, antibiotic start and stop dates in addition to other pertinent events   7/12 admitted with Bartholomew 7/14 intubated for airway  protection.   Interim History / Subjective:    Objective   Blood pressure (!) 112/56, pulse 69, temperature (!) 101 F (38.3 C), temperature source Oral, resp. rate (!) 25, height 5\' 6"  (1.676 m), weight 74.8 kg, SpO2 91 %.        Intake/Output Summary (Last 24 hours) at 04/13/2021 2121 Last data filed at 04/13/2021 0600 Gross per 24 hour  Intake 797 ml  Output 350 ml  Net 447 ml   Filed Weights   04/27/2021 1821  Weight: 74.8 kg    Examination: General: Elderly appearing male in NAD HENT: [place]/ AT, PERRL, no JVD Lungs: Coarse throughout, pooled upper airway secretions Cardiovascular: RRR, no MRG Abdomen: Soft, non-tender, non-distneded Extremities: No acute deformity.  Neuro: Awake, alert, answers yes/no questions. Weak on L upper > left lower.   Imaging reviewed by me: Ct head 7/12 > Acute parenchymal hemorrhage centered in the right basal ganglia region with mild edema and regional mass effect MRI brain 7/14 > Right lentiform intra-axial hemorrhage with estimated blood volume 40 mL is stable since yesterday. Susceptibility along the margins on DWI with no larger area of infarction. Stable intracranial mass effect with mild leftward midline shift.  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 Now complicated by cerebral edema.  - Stroke service primary.  - BP control < 157mmHg - Home HCTZ and losartan - Clevidipine  - Hypertonic saline - Na q 6 hours.   Acute hypoxemic respiratory failure secondary to aspiration. Aspirating throughout the day and then aspirated during intubation - Full vent support - VAP bundle - CXR - ABG - Levaquin for aspiration coverage (PCN allergy anaphylaxis)  Atrial fibrillation: newly diagnosed - Telemetry monitoring - Not a candidate for [facility] at this time.   Seizure history - Continue home Keppra.   Hypokalemia -  replace - follow BMP  Best Practice (right click and "Reselect all SmartList Selections" daily)   Diet/type: NPO DVT prophylaxis: not indicated GI prophylaxis: PPI Lines: N/A Foley:  N/A Code Status:  full code Last date of multidisciplinary goals of care discussion [ ]   Labs   CBC: Recent Labs  Lab 04/26/2021 1756 04/03/2021 1804 04/12/21 0817 04/13/21 0238  WBC 7.5  --  8.5 10.0  NEUTROABS 6.2  --   --   --   HGB 16.0 16.0 16.6 15.5  HCT 47.4 47.0 47.7 45.0  MCV 93.3  --  91.2 92.0  PLT 160  --  174 762    Basic Metabolic Panel: Recent Labs  Lab 04/29/2021 1756 03/31/2021 1804 04/12/21 0817 04/12/21 1528 04/12/21 2037 04/13/21 0238 04/13/21 0829 04/13/21 1352  NA 140 141 137 141 145 147* 151* 154*  K 3.2* 3.1* 3.4*  --   --  3.2*  --   --   CL 105 105 103  --   --  116*  --   --   CO2 24  --  24  --   --  20*  --   --   GLUCOSE 176* 173* 130*  --   --  151*  --   --   BUN 27* 29* 15  --   --  18  --   --   CREATININE 1.02 0.90 0.75  --   --  1.12  --   --   CALCIUM 9.1  --  8.8*  --   --  8.5*  --   --    GFR: Estimated Creatinine Clearance: 48.3 mL/min (by C-G formula based on SCr of 1.12 mg/dL). Recent Labs  Lab 04/26/2021 1756 04/12/21 0817 04/13/21 0238  WBC 7.5 8.5 10.0    Liver Function Tests: Recent Labs  Lab 04/01/2021 1756  AST 22  ALT 20  ALKPHOS 59  BILITOT 1.8*  PROT 7.0  ALBUMIN 4.1   No results for input(s): LIPASE, AMYLASE in the last 168 hours. No results for input(s): AMMONIA in the last 168 hours.  ABG    Component Value Date/Time   TCO2 23 04/02/2021 1804     Coagulation Profile: Recent Labs  Lab 04/01/2021 1756  INR 1.0    Cardiac Enzymes: No results for input(s): CKTOTAL, CKMB, CKMBINDEX, TROPONINI in the last 168 hours.  HbA1C: Hgb A1c MFr Bld  Date/Time Value Ref Range Status  04/13/2021 02:38 AM 6.2 (H) 4.8 - 5.6 % Final    Comment:    (NOTE) Pre diabetes:          5.7%-6.4%  Diabetes:               >6.4%  Glycemic control for   <7.0% adults with diabetes     CBG: Recent Labs  Lab 04/13/21 0334 04/13/21 0733 04/13/21 1159 04/13/21 1540 04/13/21 1924  GLUCAP 142* 165* 175* 254* 286*    Review of Systems:   Patient is encephalopathic and/or intubated. Therefore history has been obtained from chart review.    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   Surgical History:   Past Surgical History:  Procedure Laterality Date   ANKLE SURGERY     broken leg     CARDIAC SURGERY     CORONARY ARTERY BYPASS GRAFT     triple   FRACTURE SURGERY     Fibula and Tibia. Plates and screws installed   TONSILLECTOMY     triple bypass     VASECTOMY       Social History:   reports that he quit smoking about 13 years ago. His smoking use included cigars and pipe. He has never used smokeless tobacco. He reports current alcohol use of about 7.0 standard drinks of alcohol per week. He reports that he does not use drugs.   Family History:  His family history includes Kidney cancer in his father; Seizures in his mother.   Allergies Allergies  Allergen Reactions   Penicillins Anaphylaxis, Hives and Other (See Comments)   Sulfa Antibiotics Anaphylaxis, Hives and Other (See Comments)   Ezetimibe Other (See Comments)   Rosuvastatin Other (See Comments)   Statins     Other reaction(s): Other (See Comments) Confusion and mania      Home Medications  Prior to Admission medications   Medication Sig Start Date End Date  Taking? Authorizing Provider  atorvastatin (LIPITOR) 10 MG tablet Take 10 mg by mouth daily. 03/26/21  Yes [provider]  azelastine (ASTELIN) 0.1 % nasal spray Place 1 spray into the nose daily.  03/30/19 04/12/2021 Yes [provider]  hydrochlorothiazide (HYDRODIURIL) 25 MG tablet Take 25 mg by mouth daily.  10/06/18  Yes [provider]  latanoprost (XALATAN) 0.005 % ophthalmic solution Place 1 drop into both eyes at bedtime.  02/23/19  Yes [provider]  levETIRAcetam (KEPPRA XR) 500 MG 24 hr tablet Take 2,000 mg by mouth daily.    Yes [provider]  Loratadine 10 MG CAPS Take 10 mg by mouth daily.    Yes [provider]  losartan (COZAAR) 100 MG tablet Take 100 mg by mouth daily.  02/09/19  Yes [provider]  montelukast (SINGULAIR) 10 MG tablet Take 10 mg by mouth daily.  02/09/19  Yes [provider]  pantoprazole (PROTONIX) 40 MG tablet Take 40 mg by mouth daily.  10/06/18  Yes [provider]  SYSTANE COMPLETE 0.6 % SOLN Apply 1 drop to eye at bedtime. 04/14/20  Yes [provider]  VASCEPA 0.5 g CAPS Take 2 capsules by mouth in the morning and at bedtime. 06/04/20  Yes [provider]  Vitamin D, Ergocalciferol, (DRISDOL) 1.25 MG (50000 UNIT) CAPS capsule Take 50,000 Units by mouth every Wednesday. 03/30/21  Yes [provider]     Critical care time: 39 minutes     Georgann Housekeeper, AGACNP-BC [facility]  See Amion for personal pager PCCM on call pager [phone] until 7pm. Please call Elink 7p-7a. [phone]  04/13/2021 10:37 PM

## 2021-04-14 ENCOUNTER — Inpatient Hospital Stay: Payer: Self-pay

## 2021-04-14 ENCOUNTER — Inpatient Hospital Stay (HOSPITAL_COMMUNITY): Payer: Medicare Other

## 2021-04-14 DIAGNOSIS — I619 Nontraumatic intracerebral hemorrhage, unspecified: Secondary | ICD-10-CM | POA: Diagnosis not present

## 2021-04-14 DIAGNOSIS — J9611 Chronic respiratory failure with hypoxia: Secondary | ICD-10-CM | POA: Diagnosis not present

## 2021-04-14 DIAGNOSIS — N179 Acute kidney failure, unspecified: Secondary | ICD-10-CM

## 2021-04-14 DIAGNOSIS — Z978 Presence of other specified devices: Secondary | ICD-10-CM | POA: Diagnosis not present

## 2021-04-14 DIAGNOSIS — I10 Essential (primary) hypertension: Secondary | ICD-10-CM | POA: Diagnosis not present

## 2021-04-14 DIAGNOSIS — E440 Moderate protein-calorie malnutrition: Secondary | ICD-10-CM | POA: Insufficient documentation

## 2021-04-14 DIAGNOSIS — T17908A Unspecified foreign body in respiratory tract, part unspecified causing other injury, initial encounter: Secondary | ICD-10-CM | POA: Diagnosis not present

## 2021-04-14 LAB — GLUCOSE, CAPILLARY
Glucose-Capillary: 109 mg/dL — ABNORMAL HIGH (ref 70–99)
Glucose-Capillary: 125 mg/dL — ABNORMAL HIGH (ref 70–99)
Glucose-Capillary: 135 mg/dL — ABNORMAL HIGH (ref 70–99)
Glucose-Capillary: 137 mg/dL — ABNORMAL HIGH (ref 70–99)
Glucose-Capillary: 174 mg/dL — ABNORMAL HIGH (ref 70–99)
Glucose-Capillary: 154 mg/dL — ABNORMAL HIGH (ref 70–99)

## 2021-04-14 LAB — CBC
HCT: 49.2 % (ref 39.0–52.0)
Hemoglobin: 16.1 g/dL (ref 13.0–17.0)
MCH: 32.1 pg (ref 26.0–34.0)
MCHC: 32.7 g/dL (ref 30.0–36.0)
MCV: 98 fL (ref 80.0–100.0)
Platelets: 146 10*3/uL — ABNORMAL LOW (ref 150–400)
RBC: 5.02 MIL/uL (ref 4.22–5.81)
RDW: 13.9 % (ref 11.5–15.5)
WBC: 5 10*3/uL (ref 4.0–10.5)
nRBC: 0 % (ref 0.0–0.2)

## 2021-04-14 LAB — BASIC METABOLIC PANEL
BUN: 33 mg/dL — ABNORMAL HIGH (ref 8–23)
BUN: 39 mg/dL — ABNORMAL HIGH (ref 8–23)
CO2: 19 mmol/L — ABNORMAL LOW (ref 22–32)
CO2: 16 mmol/L — ABNORMAL LOW (ref 22–32)
Calcium: 8.2 mg/dL — ABNORMAL LOW (ref 8.9–10.3)
Calcium: 8.4 mg/dL — ABNORMAL LOW (ref 8.9–10.3)
Chloride: >130 mmol/L (ref 98–111)
Chloride: >130 mmol/L (ref 98–111)
Creatinine, Ser: 2.34 mg/dL — ABNORMAL HIGH (ref 0.61–1.24)
Creatinine, Ser: 2.77 mg/dL — ABNORMAL HIGH (ref 0.61–1.24)
GFR, Estimated: 28 mL/min — ABNORMAL LOW (ref >60)
GFR, Estimated: 23 mL/min — ABNORMAL LOW (ref >60)
Glucose, Bld: 110 mg/dL — ABNORMAL HIGH (ref 70–99)
Glucose, Bld: 145 mg/dL — ABNORMAL HIGH (ref 70–99)
Potassium: 3.6 mmol/L (ref 3.5–5.1)
Potassium: 3.6 mmol/L (ref 3.5–5.1)
Sodium: 161 mmol/L (ref 135–145)
Sodium: 161 mmol/L (ref 135–145)

## 2021-04-14 LAB — POCT I-STAT 7, (LYTES, BLD GAS, ICA,H+H)
Acid-base deficit: 9 mmol/L — ABNORMAL HIGH (ref 0.0–2.0)
Bicarbonate: 17.8 mmol/L — ABNORMAL LOW (ref 20.0–28.0)
Calcium, Ion: 1.27 mmol/L (ref 1.15–1.40)
HCT: 43 % (ref 39.0–52.0)
Hemoglobin: 14.6 g/dL (ref 13.0–17.0)
O2 Saturation: 96 %
Patient temperature: 98.5
Potassium: 3.6 mmol/L (ref 3.5–5.1)
Sodium: 167 mmol/L (ref 135–145)
TCO2: 19 mmol/L — ABNORMAL LOW (ref 22–32)
pCO2 arterial: 39.8 mmHg (ref 32.0–48.0)
pH, Arterial: 7.259 — ABNORMAL LOW (ref 7.350–7.450)
pO2, Arterial: 90 mmHg (ref 83.0–108.0)

## 2021-04-14 LAB — MAGNESIUM
Magnesium: 2.1 mg/dL (ref 1.7–2.4)
Magnesium: 1.7 mg/dL (ref 1.7–2.4)

## 2021-04-14 LAB — PHOSPHORUS
Phosphorus: 1.9 mg/dL — ABNORMAL LOW (ref 2.5–4.6)
Phosphorus: >30 mg/dL — ABNORMAL HIGH (ref 2.5–4.6)

## 2021-04-14 LAB — TRIGLYCERIDES: Triglycerides: 62 mg/dL (ref <150)

## 2021-04-14 LAB — SODIUM
Sodium: 161 mmol/L (ref 135–145)
Sodium: 153 mmol/L — ABNORMAL HIGH (ref 135–145)
Sodium: 151 mmol/L — ABNORMAL HIGH (ref 135–145)

## 2021-04-14 MED ORDER — LEVETIRACETAM 100 MG/ML PO SOLN
500.0000 mg | Freq: Two times a day (BID) | ORAL | Status: DC
  Administered 2021-04-14 – 2021-04-16 (×4): 500 mg
  Filled 2021-04-14 (×4): qty 5

## 2021-04-14 MED ORDER — LACTATED RINGERS IV SOLN: INTRAVENOUS | Status: DC

## 2021-04-14 MED ORDER — SODIUM CHLORIDE 0.9% FLUSH
10.0000 mL | Freq: Two times a day (BID) | INTRAVENOUS | Status: DC
  Administered 2021-04-14 – 2021-04-17 (×5): 10 mL
  Administered 2021-04-17: 20 mL
  Administered 2021-04-18: 10 mL
  Administered 2021-04-18: 20 mL
  Administered 2021-04-19: 10 mL

## 2021-04-14 MED ORDER — FREE WATER: 300.0000 mL | Status: DC

## 2021-04-14 MED ORDER — LACTATED RINGERS IV BOLUS
500.0000 mL | Freq: Once | INTRAVENOUS | Status: AC
  Administered 2021-04-14: 500 mL via INTRAVENOUS

## 2021-04-14 MED ORDER — FENTANYL 2500MCG IN NS 250ML (10MCG/ML) PREMIX INFUSION
25.0000 ug/h | INTRAVENOUS | Status: DC
  Administered 2021-04-14: 25 ug/h via INTRAVENOUS
  Filled 2021-04-14 (×2): qty 250

## 2021-04-14 MED ORDER — POTASSIUM PHOSPHATES 15 MMOLE/5ML IV SOLN
30.0000 mmol | Freq: Once | INTRAVENOUS | Status: AC
  Administered 2021-04-14: 30 mmol via INTRAVENOUS
  Filled 2021-04-14: qty 10

## 2021-04-14 MED ORDER — PROSOURCE TF PO LIQD
45.0000 mL | Freq: Three times a day (TID) | ORAL | Status: DC
  Administered 2021-04-14 – 2021-04-18 (×13): 45 mL
  Filled 2021-04-14 (×12): qty 45

## 2021-04-14 MED ORDER — FREE WATER
200.0000 mL | Freq: Four times a day (QID) | Status: DC
  Administered 2021-04-14 – 2021-04-15 (×2): 200 mL

## 2021-04-14 MED ORDER — SODIUM CHLORIDE 0.9% FLUSH: 10.0000 mL | INTRAVENOUS | Status: DC | PRN

## 2021-04-14 MED ORDER — FENTANYL BOLUS VIA INFUSION
25.0000 ug | INTRAVENOUS | Status: DC | PRN
Start: 2021-04-14 — End: 2021-04-16
  Administered 2021-04-15: 50 ug via INTRAVENOUS
  Administered 2021-04-15: 75 ug via INTRAVENOUS
  Administered 2021-04-15 (×2): 25 ug via INTRAVENOUS
  Administered 2021-04-15: 50 ug via INTRAVENOUS
  Filled 2021-04-14: qty 100

## 2021-04-14 MED ORDER — FREE WATER
200.0000 mL | Status: DC
  Administered 2021-04-14 (×2): 200 mL

## 2021-04-14 MED ORDER — VITAL 1.5 CAL PO LIQD
1000.0000 mL | ORAL | Status: DC
  Administered 2021-04-14 – 2021-04-15 (×2): 1000 mL

## 2021-04-14 MED ORDER — PHENYLEPHRINE HCL-NACL 10-0.9 MG/250ML-% IV SOLN
0.0000 ug/min | INTRAVENOUS | Status: DC
  Administered 2021-04-14 (×3): 40 ug/min via INTRAVENOUS
  Administered 2021-04-14: 20 ug/min via INTRAVENOUS
  Administered 2021-04-15: 30 ug/min via INTRAVENOUS
  Administered 2021-04-15: 40 ug/min via INTRAVENOUS
  Filled 2021-04-14 (×6): qty 250

## 2021-04-14 NOTE — Progress Notes (Signed)
RT NOTES: Transported patient to CT and back to room 4N18 on vent without complications.

## 2021-04-14 NOTE — Progress Notes (Signed)
Critical ABG result reported to [facility]-Link.

## 2021-04-14 NOTE — Progress Notes (Signed)
   Discussed with Dr. Johnney Ou, nephrology, given AKI and PICC order.   Ok to proceed with PICC placement.        Kennieth Rad, ACNP [facility] 04/14/2021, 2:29 PM  See Amion for pager If no response to pager, please call PCCM consult pager After 7:00 pm call Elink

## 2021-04-14 NOTE — Progress Notes (Signed)
Initial Nutrition Assessment  DOCUMENTATION CODES:   Non-severe (moderate) malnutrition in context of chronic illness  INTERVENTION:   Initiate tube feeding via OG tube: Vital 1.5 at 20 ml/h increase by 10 ml every 8 hours to goal rate of 60 ml/h (1440 ml per day) Prosource TF 45 ml TID  Provides 2280 kcal, 130 gm protein, 2294 ml free water daily   200 ml free water every 4 hours  Total free water: 2112 ml  Monitor magnesium and phosphorus every 12 hours x 4 occurances, MD to replete as needed, as pt is at risk for refeeding syndrome given moderate malnutrition.  NUTRITION DIAGNOSIS:   Moderate Malnutrition related to chronic illness (CAD) as evidenced by moderate muscle depletion, moderate fat depletion.  GOAL:   Patient will meet greater than or equal to 90% of their needs  MONITOR:   TF tolerance, Labs  REASON FOR ASSESSMENT:   Ventilator, Consult Enteral/tube feeding initiation and management  ASSESSMENT:   Pt who is the caregiver of his wife has a PMH of Barrett's esophagus, CAD s/p CABG x 3, epilepsy well controlled on Keppra, GERD, HLD, HTN, MGUS, and MI admitted 6/19 with ICH complicated with cerebral edema.    Pt discussed during ICU rounds and with RN. Per report family resides in [place] except wife. He is the caregiver of his wife who is now currently admitted to [facility].  Spoke with CCM PA, ok to start nutrition, requested phosphorus replacement.  Per RN plan to transition from propofol to precedex today.    MAP > 62, on neo  7/14 intubated after possible aspiration   Patient is currently intubated on ventilator support MV: 16.2 L/min Temp (24hrs), Avg:99.5 F (37.5 C), Min:98.5 F (36.9 C), Max:101 F (38.3 C)  Propofol: 4 ml/hr provides: 105 kcal  Medications reviewed and include: colace, SSI, protonix, miralax, senokot-s  Fentanyl LR @ 100 ml/hr Neosynephrine @ 20 mcg  Labs reviewed: Na 161, Cl: > 130, 39: 39, Cr: 2.77, PO4: 1.9 (CCM  replacing)     NUTRITION - FOCUSED PHYSICAL EXAM:  Flowsheet Row Most Recent Value  Orbital Region Moderate depletion  Upper Arm Region Moderate depletion  Thoracic and Lumbar Region No depletion  Buccal Region Unable to assess  Temple Region Mild depletion  Clavicle Bone Region Mild depletion  Scapular Bone Region Moderate depletion  Dorsal Hand No depletion  Patellar Region Moderate depletion  Anterior Thigh Region Moderate depletion  Posterior Calf Region No depletion  Edema (RD Assessment) Mild  Hair Reviewed  Eyes Unable to assess  Mouth Unable to assess  Skin Reviewed  [ecchymosis L arm]  Nails Reviewed       Diet Order:   Diet Order             Diet NPO time specified  Diet effective now                   EDUCATION NEEDS:   Not appropriate for education at this time  Skin:  Skin Assessment: Reviewed RN Assessment  Last BM:  unknown  Height:   Ht Readings from Last 1 Encounters:  04/21/2021 5\' 6"  (1.676 m)    Weight:   Wt Readings from Last 1 Encounters:  04/12/2021 74.8 kg    Ideal Body Weight:     BMI:  Body mass index is 26.62 kg/m.  Estimated Nutritional Needs:   Kcal:  2250  Protein:  115-130 grams  Fluid:  >2 L/day  Lockie Pares., RD, LDN,  CNSC See AMiON for contact information

## 2021-04-14 NOTE — Progress Notes (Signed)
STROKE TEAM PROGRESS NOTE   SUBJECTIVE (INTERVAL HISTORY) His RN is at the bedside. Pt overnight lethargic, desating and tachypnea concerning for aspiration and was intubated for airway protection. He was also found to have urinary retention with I/O 1300cc urine. His Cre elevated to 2.77 this am, big jump from yesterday, concerning for post-renal, will insert foley. Also low BP on propofol and fentanyl, and high Na 161, will give FW and fluid. D/c BP po meds, and now on levophed.   OBJECTIVE Temp:  [98.5 F (36.9 C)-101 F (38.3 C)] 99 F (37.2 C) (07/15 0800) Pulse Rate:  [63-109] 81 (07/15 1051) Cardiac Rhythm: Normal sinus rhythm (07/15 0800) Resp:  [14-37] 31 (07/15 1051) BP: (65-160)/(50-120) 85/60 (07/15 1051) SpO2:  [84 %-100 %] 98 % (07/15 1051) FiO2 (%):  [50 %-100 %] 50 % (07/15 1051)  Recent Labs  Lab 04/13/21 1924 04/13/21 2307 04/14/21 0313 04/14/21 0731 04/14/21 1115  GLUCAP 286* 204* 109* 125* 135*   Recent Labs  Lab 04/12/21 0817 04/12/21 1528 04/13/21 0238 04/13/21 0829 04/13/21 2225 04/13/21 2320 04/14/21 0409 04/14/21 0518 04/14/21 0834  NA 137   < > 147*   < > 153* 163* 161* 167* 161*  K 3.4*  --  3.2*  --  3.1* 2.9* 3.6 3.6 3.6  CL 103  --  116*  --  127*  --  >130*  --  >130*  CO2 24  --  20*  --  14*  --  19*  --  16*  GLUCOSE 130*  --  151*  --  258*  --  110*  --  145*  BUN 15  --  18  --  29*  --  33*  --  39*  CREATININE 0.75  --  1.12  --  1.79*  --  2.34*  --  2.77*  CALCIUM 8.8*  --  8.5*  --  8.3*  --  8.2*  --  8.4*  MG  --   --   --   --   --   --   --   --  2.1  PHOS  --   --   --   --   --   --   --   --  1.9*   < > = values in this interval not displayed.   Recent Labs  Lab 04/08/2021 1756  AST 22  ALT 20  ALKPHOS 59  BILITOT 1.8*  PROT 7.0  ALBUMIN 4.1   Recent Labs  Lab 04/26/2021 1756 04/17/2021 1804 04/12/21 0817 04/13/21 0238 04/13/21 2320 04/14/21 0409 04/14/21 0518  WBC 7.5  --  8.5 10.0  --  5.0  --    NEUTROABS 6.2  --   --   --   --   --   --   HGB 16.0   < > 16.6 15.5 13.6 16.1 14.6  HCT 47.4   < > 47.7 45.0 40.0 49.2 43.0  MCV 93.3  --  91.2 92.0  --  98.0  --   PLT 160  --  174 173  --  146*  --    < > = values in this interval not displayed.   No results for input(s): CKTOTAL, CKMB, CKMBINDEX, TROPONINI in the last 168 hours. Recent Labs    04/25/2021 1756  LABPROT 12.9  INR 1.0   Recent Labs    04/12/21 0106  COLORURINE YELLOW  LABSPEC 1.019  PHURINE 7.0  GLUCOSEU NEGATIVE  HGBUR NEGATIVE  BILIRUBINUR NEGATIVE  KETONESUR 20*  PROTEINUR NEGATIVE  NITRITE NEGATIVE  LEUKOCYTESUR NEGATIVE       Component Value Date/Time   CHOL 239 (H) 04/13/2021 0238   TRIG 62 04/14/2021 0409   HDL 72 04/13/2021 0238   CHOLHDL 3.3 04/13/2021 0238   VLDL 18 04/13/2021 0238   LDLCALC 149 (H) 04/13/2021 0238   Lab Results  Component Value Date   HGBA1C 6.2 (H) 04/13/2021      Component Value Date/Time   LABOPIA NONE DETECTED 04/12/2021 0107   COCAINSCRNUR NONE DETECTED 04/12/2021 0107   LABBENZ NONE DETECTED 04/12/2021 0107   AMPHETMU NONE DETECTED 04/12/2021 0107   THCU NONE DETECTED 04/12/2021 0107   LABBARB NONE DETECTED 04/12/2021 0107    No results for input(s): ETH in the last 168 hours.  I have personally reviewed the radiological images below and agree with the radiology interpretations.  CT ANGIO HEAD W OR WO CONTRAST  Result Date: 04/12/2021 CLINICAL DATA:  80 year old male code stroke presentation on 04/10/2021 with right lentiform intra-axial hemorrhage. Subsequent encounter. EXAM: CT ANGIOGRAPHY HEAD AND NECK TECHNIQUE: Multidetector CT imaging of the head and neck was performed using the standard protocol during bolus administration of intravenous contrast. Multiplanar CT image reconstructions and MIPs were obtained to evaluate the vascular anatomy. Carotid stenosis measurements (when applicable) are obtained utilizing NASCET criteria, using the distal internal  carotid diameter as the denominator. CONTRAST:  47mL OMNIPAQUE IOHEXOL 350 MG/ML SOLN COMPARISON:  Head CT 04/18/2021. FINDINGS: CT HEAD Brain: Calcified atherosclerosis at the skull base. Hyperdense intra-axial hemorrhage centered at the right lentiform encompasses 62 by 28 x 49 mm (AP by transverse by CC) for an estimated blood volume of 43 mL, and has mildly extended since presentation (approximately 31 mL at that time). But surrounding edema and regional mass effect have not significantly changed. Partially effaced right lateral ventricle with no ventriculomegaly. No intraventricular or extra-axial extension of blood. No significant midline shift. Basilar cisterns remain patent. Stable gray-white matter differentiation elsewhere. 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 Calvarium and skull base: Intact, negative. Paranasal sinuses: Continued paranasal sinus opacification maximal in the ethmoids. Tympanic cavities and mastoids remain clear. Orbits: Visualized orbits and scalp soft tissues are within normal limits. Negative noncontrast deep soft tissue spaces of the face. CTA NECK Skeleton: Right side TMJ degeneration. Widespread cervical spine degeneration. No acute or suspicious osseous lesion identified. Upper chest: Mild upper lobe lung scarring mostly on the left. Retained secretions, bubbly opacity in the visible trachea. No superior mediastinal lymphadenopathy. Other neck: Negative.  No neck mass or lymphadenopathy. Aortic arch: Extensive soft and calcified arch atherosclerosis. Three vessel arch configuration. Great vessel origins relatively spared. Right carotid system: Mild brachiocephalic artery and right CCA origin atherosclerosis without stenosis. Mildly tortuous right CCA. Mostly calcified plaque at the right carotid bifurcation and posterior right ICA bulb without stenosis. Mildly tortuous ICA to the skull base. Left carotid system: Minimal plaque at the left CCA origin without stenosis.  Soft plaque in the medial vessel at the level of the larynx on series 5, image 131 without stenosis. Minor plaque at the left ICA origin without stenosis. Mildly tortuous left ICA to the skull base. Vertebral arteries: Soft and calcified plaque in the proximal right subclavian artery without stenosis. Right vertebral artery origin remains normal. Tortuous right V1 and proximal V2 segments. Patent right vertebral artery to the skull base without significant plaque or stenosis. Soft and calcified plaque in the proximal left  subclavian artery without stenosis. Mild involvement of the left vertebral artery origin without stenosis. Tortuous left V1 and proximal V2 segments. Codominant left vertebral artery is patent to the skull base without significant plaque or stenosis. CTA HEAD Posterior circulation: Codominant distal vertebral arteries with mild calcified plaque mostly on the left. No significant stenosis. Patent PICA origins and vertebrobasilar junction. Patent basilar artery with mild irregularity but no significant stenosis. Patent SCA and right PCA origins. Fetal left PCA origin. Right posterior communicating artery diminutive or absent. Bilateral P2 segments are mildly irregular without stenosis. But there is bilateral P3 segment stenosis, mild to moderate involving the inferior division on the left (series 10, image 31) and moderate to severe involving the right P3 trunk on series 10, image 26. Anterior circulation: Both ICA siphons are patent. Abundant siphon calcified plaque on both sides but no significant stenosis on the left. There is moderate stenosis of the right supraclinoid ICA on series 7, image 93. Normal left posterior communicating artery. Patent carotid termini. MCA and ACA origins are within normal limits. No convincing CTA spot sign associated with the right lentiform hemorrhage. Mildly dominant and tortuous right ACA A1 segment. Mild left A1 irregularity without stenosis. 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 Otherwise the bilateral ACA branches are within normal limits. Left MCA M1 segment and bifurcation are patent without stenosis. Left MCA branches are within normal limits. Right MCA M1 segment and bifurcation are patent without stenosis. Right MCA branches are within normal limits. Venous sinuses: Early contrast timing but grossly patent. Anatomic variants: Fetal left PCA origin. Mildly dominant right ACA A1. Review of the MIP images confirms the above findings IMPRESSION: 1. Right lentiform intra-axial hemorrhage has slightly enlarged since presentation, estimated blood volume now 43 mL (previously 31 mL). But there is no CTA spot sign, and mild regional edema and mass effect are stable. 2. 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 There is up to Moderate stenosis of the Right supraclinoid ICA due to calcified plaque. And there is Moderate To Severe bilateral PCA P3 segment stenosis. No significant Vertebrobasilar or cervical carotid stenosis. 3. Aortic Atherosclerosis (ICD10-I70.0). Electronically Signed   By: Genevie Ann M.D.   On: 04/12/2021 06:27   MR BRAIN WO CONTRAST  Result Date: 04/13/2021 CLINICAL DATA:  80 year old male code stroke presentation on 04/25/2021 with right lentiform intra-axial hemorrhage. EXAM: MRI HEAD WITHOUT CONTRAST TECHNIQUE: Multiplanar, multiecho pulse sequences of the brain and surrounding structures were obtained without intravenous contrast. COMPARISON:  CTA head and neck 04/12/2021 and earlier. FINDINGS: Brain: Mix signal intra-axial hemorrhage centered at the right lentiform encompasses 68 by 26 by 45 mm (AP by transverse by CC) for an estimated blood volume a 40 mL, stable since 04/12/2021. Surrounding edema. Susceptibility artifact along the margins of the hematoma on DWI. No larger area of restricted diffusion  surrounding the blood. No restricted diffusion elsewhere. Stable mass effect on the right lateral ventricle. No intraventricular extension. No extra-axial extension. Mild leftward midline shift of 3-4 mm is stable. No ventriculomegaly. Basilar cisterns remain patent. No chronic cerebral blood products elsewhere on T2* imaging. 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 No cortical encephalomalacia. Brainstem and cerebellum appear negative. Cervicomedullary junction and pituitary are within normal limits. Vascular: Major intracranial vascular  flow voids are preserved. Skull and upper cervical spine: Negative visible cervical spine. Visualized bone marrow signal is within normal limits. Sinuses/Orbits: Negative orbits. Moderate ethmoid and sphenoid sinus mucosal thickening. Mild maxillary sinus involvement. Other: Mastoids are clear. Visible internal auditory structures appear normal. Negative visible scalp and face. IMPRESSION: 1. Right lentiform intra-axial hemorrhage with estimated blood volume 40 mL is stable since yesterday. Susceptibility along the margins on DWI with no larger area of infarction. Stable intracranial mass effect with mild leftward midline shift. 2. No underlying chronic cerebral blood products, but there are contralateral chronic lacunar infarcts in the left basal ganglia. Electronically Signed   By: Genevie Ann M.D.   On: 04/13/2021 11:50   DG CHEST PORT 1 VIEW  Result Date: 04/13/2021 CLINICAL DATA:  Check endotracheal tube placement EXAM: PORTABLE CHEST 1 VIEW COMPARISON:  04/12/2021 FINDINGS: Endotracheal tube is noted in place 16 mm above the carina. Gastric catheter is noted coiled within the stomach. Cardiac shadow is stable. The lungs are well aerated bilaterally. Mild central vascular congestion is seen without edema. IMPRESSION: Endotracheal tube  in satisfactory position. Gastric catheter within the stomach. Mild vascular congestion without interstitial edema. Electronically Signed   By: Inez Catalina M.D.   On: 04/13/2021 21:55   DG CHEST PORT 1 VIEW  Result Date: 04/12/2021 CLINICAL DATA:  80 year old male with intracranial hemorrhage. Hypoxia. EXAM: PORTABLE CHEST 1 VIEW COMPARISON:  Chest radiographs 08/28/2006. CTA neck today reported separately. FINDINGS: Portable AP semi upright view at 0415 hours. Mildly lower lung volumes compared to 2007. Normal cardiac size and mediastinal contours. Visualized tracheal air column is within normal limits. Allowing for portable technique the lungs are clear. No pneumothorax or pleural effusion. Chronic left anterior rib cerclage wires are stable. Paucity of bowel gas in the upper abdomen. IMPRESSION: No acute cardiopulmonary abnormality. Electronically Signed   By: Genevie Ann M.D.   On: 04/12/2021 06:29   DG Swallowing Func-Speech Pathology  Result Date: 04/12/2021 Formatting of this result is different from the original. Objective Swallowing Evaluation: Type of Study: MBS-Modified Barium Swallow Study  Patient Details Name: Artice Bergerson MRN: [id number] Date of Birth: 1941-04-08 Today's Date: 04/12/2021 Time: SLP Start Time (ACUTE ONLY): 16 -SLP Stop Time (ACUTE ONLY): 3086 SLP Time Calculation (min) (ACUTE ONLY): 16 min Past Medical History: Past Medical History: Diagnosis Date  Allergic rhinitis   Allergy   Barrett's esophagus   Cataract cortical, senile   Coronary artery disease involving coronary bypass graft of native heart   Epilepsy (HCC)   GERD (gastroesophageal reflux disease)   Glaucoma   Hyperlipidemia   Hypertension   Insomnia   MGUS (monoclonal gammopathy of unknown significance)   Myocardial infarction ([place])   Nephrolithiasis   Osteoporosis   S/P triple vessel bypass   Sleep apnea   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 Plates and screws installed  TONSILLECTOMY    triple bypass    VASECTOMY   HPI: 80 yr old man  with history of epilepsy, Barrett's esophagus, GERD, HTN, MI s/p bypass, and OSA who had a fall in the shower with sudden left sided  hemiplegia with a fixed rt gaze. CT revealed right Basal Ganglia hemorrhage. CT repeated am 7/13 with  right lentiform intra-axial hemorrhage has slightly enlarged since presentation.  No data recorded Assessment / Plan / Recommendation CHL IP CLINICAL IMPRESSIONS 04/12/2021 Clinical Impression Oral phase of swallow marked by decreased cohesion, ROM and manipulation resulted in anterior spill, left lateral sulci residue, lingual residue, holding and transit delays. Lingual residue spilled to valleculae and cleared with cues and spontaneous swallows. Pharyngeal and laryngeal mobility and strength were adequate once swallow was initiated. Poor timing to close larynx occured with thin liquid barium resulting in incomplete laryngeal closure and aspiration with reflexive cough (PAS 7) unable to clear trachea. Pt was very restless, pushing to his left, distracted and therapist unable to attempt compensatory strategies during study. Recommend Dys 1 (puree), nectar thick liquids, crush pills, check left buccal cavity for pocketing, limit distractions, straws allowed and full supervision and assist. SLP Visit Diagnosis Dysphagia, oropharyngeal phase (R13.12) Attention and concentration deficit following -- Frontal lobe and executive function deficit following -- Impact on safety and function Moderate aspiration risk;Mild aspiration risk   CHL IP TREATMENT RECOMMENDATION 04/12/2021 Treatment Recommendations Therapy as outlined in treatment plan below   Prognosis 04/12/2021 Prognosis for Safe Diet Advancement Good Barriers to Reach Goals Cognitive  deficits Barriers/Prognosis Comment -- CHL IP DIET RECOMMENDATION 04/12/2021 SLP Diet Recommendations Dysphagia 1 (Puree) solids;Nectar thick liquid Liquid Administration via Straw;Cup Medication Administration Crushed with puree Compensations Minimize environmental distractions;Slow rate;Small sips/bites;Lingual sweep for clearance of pocketing;Monitor for anterior loss Postural Changes Seated upright at 90 degrees   CHL IP OTHER RECOMMENDATIONS 04/12/2021 Recommended Consults -- Oral Care Recommendations Oral care BID Other Recommendations --   CHL IP FOLLOW UP RECOMMENDATIONS 04/12/2021 Follow up Recommendations Inpatient Rehab   CHL IP FREQUENCY AND DURATION 04/12/2021 Speech Therapy Frequency (ACUTE ONLY) min 2x/week Treatment Duration 2 weeks      CHL IP ORAL PHASE 04/12/2021 Oral Phase Impaired Oral - Pudding Teaspoon -- Oral - Pudding Cup -- Oral - Honey Teaspoon -- Oral - Honey Cup -- Oral - Nectar Teaspoon Left anterior bolus loss;Reduced posterior propulsion;Decreased bolus cohesion;Left pocketing in lateral sulci;Lingual/palatal residue Oral - Nectar Cup Left anterior bolus loss;Reduced posterior propulsion;Decreased bolus cohesion;Left pocketing in lateral sulci;Lingual/palatal residue Oral - Nectar Straw Reduced posterior propulsion;Delayed oral transit;Lingual/palatal residue Oral - Thin Teaspoon Left anterior bolus loss;Reduced posterior propulsion;Decreased bolus cohesion;Holding of bolus Oral - Thin Cup Left anterior bolus loss;Reduced posterior propulsion;Decreased bolus cohesion;Holding of bolus Oral - Thin Straw -- Oral - Puree Holding of bolus;Lingual/palatal residue;Delayed oral transit Oral - Mech Soft -- Oral - Regular Left pocketing in lateral sulci;Delayed oral transit Oral - Multi-Consistency -- Oral - Pill -- Oral Phase - Comment --  CHL IP PHARYNGEAL PHASE 04/12/2021 Pharyngeal Phase Impaired Pharyngeal- Pudding Teaspoon -- Pharyngeal -- Pharyngeal- Pudding Cup -- Pharyngeal -- Pharyngeal-  Honey Teaspoon -- Pharyngeal -- Pharyngeal- Honey Cup -- Pharyngeal -- Pharyngeal- Nectar Teaspoon WFL Pharyngeal -- Pharyngeal- Nectar Cup Pharyngeal residue - valleculae Pharyngeal -- Pharyngeal- Nectar Straw WFL Pharyngeal -- Pharyngeal- Thin Teaspoon WFL Pharyngeal -- Pharyngeal- Thin Cup Penetration/Aspiration during swallow Pharyngeal Material enters airway, passes BELOW cords and not ejected out despite cough attempt by patient Pharyngeal- Thin Straw -- Pharyngeal -- Pharyngeal- Puree -- Pharyngeal -- Pharyngeal- Mechanical Soft -- Pharyngeal -- Pharyngeal- Regular -- Pharyngeal -- Pharyngeal- Multi-consistency -- Pharyngeal -- Pharyngeal- Pill -- Pharyngeal -- Pharyngeal Comment --  No flowsheet data found. [place] Siren 04/12/2021, 2:31 PM              ECHOCARDIOGRAM COMPLETE  Result Date: 04/12/2021  ECHOCARDIOGRAM REPORT   Patient Name:   MELCHOR KIRCHGESSNER Date of Exam: 04/12/2021 Medical Rec #:  [id number]      Height:       66.0 in Accession #:    [id number]     Weight:       164.9 lb Date of Birth:  02/13/1941      BSA:          1.842 m Patient Age:    42 years       BP:           146/75 mmHg Patient Gender: M              HR:           80 bpm. Exam Location:  Inpatient Procedure: 2D Echo, Cardiac Doppler and Color Doppler Indications:    Stroke I63.9  History:        Patient has no prior history of Echocardiogram examinations. CAD                 and Previous Myocardial Infarction; Risk Factors:Dyslipidemia                 and Hypertension.  Sonographer:    Bernadene Person RDCS Referring Phys: [id number] [place]  1. Left ventricular ejection fraction, by estimation, is 45%. The left ventricle demonstrates regional wall motion abnormalities with basal inferior akinesis, basal to mid inferolateral akinesis, basal anterolateral hypokinesis. There is mild left ventricular hypertrophy. Left ventricular diastolic parameters are consistent with Grade I diastolic dysfunction (impaired  relaxation).  2. Right ventricular systolic function is normal. The right ventricular size is normal. Tricuspid regurgitation signal is inadequate for assessing PA pressure.  3. Left atrial size was mildly dilated.  4. The mitral valve is normal in structure. Trivial mitral valve regurgitation. No evidence of mitral stenosis.  5. The aortic valve is tricuspid. Aortic valve regurgitation is not visualized. Mild aortic valve sclerosis is present, with no evidence of aortic valve stenosis. FINDINGS  Left Ventricle: Left ventricular ejection fraction, by estimation, is 45%. The left ventricle has mildly decreased function. The left ventricle demonstrates regional wall motion abnormalities. The left ventricular internal cavity size was normal in size. There is mild left ventricular hypertrophy. Left ventricular diastolic parameters are consistent with Grade I diastolic dysfunction (impaired relaxation). Right Ventricle: The right ventricular size is normal. No increase in right ventricular wall thickness. Right ventricular systolic function is normal. Tricuspid regurgitation signal is inadequate for assessing PA pressure. Left Atrium: Left atrial size was mildly dilated. Right Atrium: Right atrial size was normal in size. Pericardium: There is no evidence of pericardial effusion. Mitral Valve: The mitral valve is normal in structure. Mild mitral annular calcification. Trivial mitral valve regurgitation. No evidence of mitral valve stenosis. Tricuspid Valve: The tricuspid valve is normal in structure. Tricuspid valve regurgitation is not demonstrated. Aortic Valve: The aortic valve is tricuspid. Aortic valve regurgitation is not visualized. Mild aortic valve sclerosis is present, with no evidence of aortic valve stenosis. Pulmonic Valve: The pulmonic valve was normal in structure. Pulmonic valve regurgitation is not visualized. Aorta: The aortic root is normal in size and structure. Venous: The inferior vena cava was not  well visualized. IAS/Shunts: No atrial level shunt detected by color flow Doppler.  LEFT VENTRICLE PLAX 2D LVIDd:         3.90 cm LVIDs:         2.70 cm LV PW:  1.10 cm LV IVS:        1.00 cm LVOT diam:     2.30 cm LV SV:         74 LV SV Index:   40 LVOT Area:     4.15 cm  RIGHT VENTRICLE RV S prime:     12.50 cm/s TAPSE (M-mode): 1.7 cm LEFT ATRIUM             Index       RIGHT ATRIUM           Index LA diam:        5.00 cm 2.71 cm/m  RA Area:     17.70 cm LA Vol (A2C):   55.0 ml 29.85 ml/m RA Volume:   51.20 ml  27.79 ml/m LA Vol (A4C):   59.6 ml 32.35 ml/m LA Biplane Vol: 60.6 ml 32.89 ml/m  AORTIC VALVE LVOT Vmax:   97.13 cm/s LVOT Vmean:  61.600 cm/s LVOT VTI:    0.178 m  AORTA Ao Root diam: 3.70 cm Ao Asc diam:  3.00 cm  SHUNTS Systemic VTI:  0.18 m Systemic Diam: 2.30 cm Loralie Champagne MD Electronically signed by Loralie Champagne MD Signature Date/Time: 04/12/2021/3:29:37 PM    Final    CT HEAD CODE STROKE WO CONTRAST  Result Date: 04/26/2021 CLINICAL DATA:  Code stroke. EXAM: CT HEAD WITHOUT CONTRAST TECHNIQUE: Contiguous axial images were obtained from the base of the skull through the vertex without intravenous contrast. COMPARISON:  None. FINDINGS: Brain: Acute parenchymal hemorrhage is present centered in the right basal ganglia region with extension into the corona radiata superiorly and temporal lobe inferiorly. Mild surrounding edema. Regional mass effect is present with partial effacement of the right lateral ventricle. No substantial midline shift. No intraventricular extension. No hydrocephalus. Gray-white differentiation is preserved. 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 Additional patchy low-attenuation in the supratentorial white matter probably reflects chronic microvascular ischemic changes. Vascular: There is intracranial atherosclerotic calcification at the skull base. Skull: Unremarkable. Sinuses/Orbits: Ethmoid dominant mucosal thickening. Orbits are  unremarkable. Other: Mastoid air cells are clear. IMPRESSION: Acute parenchymal hemorrhage centered in the right basal ganglia region with mild edema and regional mass effect. No intraventricular extension. These results were communicated to Dr. Curly Shores at 6:11 pm on 04/01/2021 by text page via the [facility] messaging system. Electronically Signed   By: Macy Mis M.D.   On: 04/25/2021 18:15   [place] EKG SITE RITE  Result Date: 04/14/2021 If Site Rite image not attached, placement could not be confirmed due to current cardiac rhythm.  CT ANGIO NECK CODE STROKE  Result Date: 04/12/2021 CLINICAL DATA:  80 year old male code stroke presentation on 04/24/2021 with right lentiform intra-axial hemorrhage. Subsequent encounter. EXAM: CT ANGIOGRAPHY HEAD AND NECK TECHNIQUE: Multidetector CT imaging of the head and neck was performed using the standard protocol during bolus administration of intravenous contrast. Multiplanar CT image reconstructions and MIPs were obtained to evaluate the vascular anatomy. Carotid stenosis measurements (when applicable) are obtained utilizing NASCET criteria, using the distal internal carotid diameter as the denominator. CONTRAST:  75mL OMNIPAQUE IOHEXOL 350 MG/ML SOLN COMPARISON:  Head CT 04/18/2021. FINDINGS: CT HEAD Brain: Calcified atherosclerosis at the skull base. Hyperdense intra-axial hemorrhage centered at the right lentiform encompasses 62 by 28 x 49 mm (AP by transverse by CC) for an estimated blood volume of 43 mL, and has mildly extended since presentation (approximately 31 mL at that time). But surrounding edema and regional mass effect have  not significantly changed. Partially effaced right lateral ventricle with no ventriculomegaly. No intraventricular or extra-axial extension of blood. No significant midline shift. Basilar cisterns remain patent. Stable gray-white matter differentiation elsewhere. 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 Calvarium and skull base:  Intact, negative. Paranasal sinuses: Continued paranasal sinus opacification maximal in the ethmoids. Tympanic cavities and mastoids remain clear. Orbits: Visualized orbits and scalp soft tissues are within normal limits. Negative noncontrast deep soft tissue spaces of the face. CTA NECK Skeleton: Right side TMJ degeneration. Widespread cervical spine degeneration. No acute or suspicious osseous lesion identified. Upper chest: Mild upper lobe lung scarring mostly on the left. Retained secretions, bubbly opacity in the visible trachea. No superior mediastinal lymphadenopathy. Other neck: Negative.  No neck mass or lymphadenopathy. Aortic arch: Extensive soft and calcified arch atherosclerosis. Three vessel arch configuration. Great vessel origins relatively spared. Right carotid system: Mild brachiocephalic artery and right CCA origin atherosclerosis without stenosis. Mildly tortuous right CCA. Mostly calcified plaque at the right carotid bifurcation and posterior right ICA bulb without stenosis. Mildly tortuous ICA to the skull base. Left carotid system: Minimal plaque at the left CCA origin without stenosis. Soft plaque in the medial vessel at the level of the larynx on series 5, image 131 without stenosis. Minor plaque at the left ICA origin without stenosis. Mildly tortuous left ICA to the skull base. Vertebral arteries: Soft and calcified plaque in the proximal right subclavian artery without stenosis. Right vertebral artery origin remains normal. Tortuous right V1 and proximal V2 segments. Patent right vertebral artery to the skull base without significant plaque or stenosis. Soft and calcified plaque in the proximal left subclavian artery without stenosis. Mild involvement of the left vertebral artery origin without stenosis. Tortuous left V1 and proximal V2 segments. Codominant left vertebral artery is patent to the skull base without significant plaque or stenosis. CTA HEAD Posterior circulation: Codominant  distal vertebral arteries with mild calcified plaque mostly on the left. No significant stenosis. Patent PICA origins and vertebrobasilar junction. Patent basilar artery with mild irregularity but no significant stenosis. Patent SCA and right PCA origins. Fetal left PCA origin. Right posterior communicating artery diminutive or absent. Bilateral P2 segments are mildly irregular without stenosis. But there is bilateral P3 segment stenosis, mild to moderate involving the inferior division on the left (series 10, image 31) and moderate to severe involving the right P3 trunk on series 10, image 26. Anterior circulation: Both ICA siphons are patent. Abundant siphon calcified plaque on both sides but no significant stenosis on the left. There is moderate stenosis of the right supraclinoid ICA on series 7, image 93. Normal left posterior communicating artery. Patent carotid termini. MCA and ACA origins are within normal limits. No convincing CTA spot sign associated with the right lentiform hemorrhage. Mildly dominant and tortuous right ACA A1 segment. Mild left A1 irregularity without stenosis. 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 Otherwise the bilateral ACA branches are within normal limits. Left MCA M1 segment and bifurcation are patent without stenosis. Left MCA branches are within normal limits. Right MCA M1 segment and bifurcation are patent without stenosis. Right MCA branches are within normal limits. Venous sinuses: Early contrast timing but grossly patent. Anatomic variants: Fetal left PCA origin. Mildly dominant right ACA A1. Review of the MIP images confirms the above findings IMPRESSION: 1. Right lentiform intra-axial hemorrhage has slightly enlarged since presentation, estimated blood volume now 43 mL (previously  31 mL). But there is no CTA spot sign, and mild regional edema and mass effect are stable. 2. 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 There is up to Moderate stenosis of the Right supraclinoid ICA due to calcified plaque. And there is Moderate To Severe bilateral PCA P3 segment stenosis. No significant Vertebrobasilar or cervical carotid stenosis. 3. Aortic Atherosclerosis (ICD10-I70.0). Electronically Signed   By: Genevie Ann M.D.   On: 04/12/2021 06:27      PHYSICAL EXAM  Temp:  [98.5 F (36.9 C)-101 F (38.3 C)] 99 F (37.2 C) (07/15 0800) Pulse Rate:  [63-109] 81 (07/15 1051) Resp:  [14-37] 31 (07/15 1051) BP: (65-160)/(50-120) 85/60 (07/15 1051) SpO2:  [84 %-100 %] 98 % (07/15 1051) FiO2 (%):  [50 %-100 %] 50 % (07/15 1051)  General - Well nourished, well developed, intubated on sedation.  Ophthalmologic - fundi not visualized due to noncooperation.  Cardiovascular - Regular rate and rhythm, not in afib today.  Neuro - intubated on sedation, eyes closed,  following commands. With forced eye opening, eyes in mid position, not blinking to visual threat, doll's eyes sluggish, not tracking, PERRL. Corneal reflex weakly present bilaterally, gag and cough present. Breathing over the vent.  Facial symmetry not able to test due to ET tube.  Tongue protrusion not cooperative. On pain stimulation, no movement of all extremities. DTR diminished and no babinski. Sensation, coordination and gait not tested.   ASSESSMENT/PLAN Mr. Carman Essick is a 80 y.o. male with history of hypertension, hyperlipidemia, MI status post CABG x3, OSA, seizure on Keppra admitted for left-sided weakness, left neglect, fall. No tPA given due to [place].    ICH:  right BG ICH likely hypertensive etiology CT head right BG ICH CTA head and neck moderate stenosis of the right supraclinoid ICA, moderate to severe bilateral P3.  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 CT repeat showed slight increase of right BG hemorrhage, no CT spot sign, mild regional edema and mass-effect MRI  stable right BG hematoma, minimal midline shift CT head repeat 7/15 large stable hematoma right BG, increased mass-effect with edema and mild midline shift 2D Echo EF 45% LDL 149 HgbA1c 6.2 SCDs for VTE prophylaxis No antithrombotic prior to admission, now on No antithrombotic due to [place] Ongoing aggressive stroke risk factor management Therapy recommendations: Pending Disposition: Pending  Cerebral edema CT head repeat showed slight increase of right BG ICH, mild regional edema and mass-effect Given the size of ICH, put on 3% saline Na goal 150-155 Na 137->141->145->151->154->161 3% saline @ 50->75->50-> off On FW MRI stable right BG hematoma, minimal midline shift  Respiratory failure ?  Aspiration Intubated overnight On vent and sedation CCM on board  New diagnosed paroxysmal A. fib EKG showed A. Fib  Telemetry A. Fib -> now sinus Rate controlled Not AC candidate  History of seizure On home Keppra XR 2000 daily Continue Keppra 1 g twice daily -> 500 bid for renal adjustment  AKI Urinary retention Creatinine 0.75-1.1-1.79-2.34-2.77 Could be post renal Foley catheter placed On FW CCM following  Hypertension & hypotension Stable on Cleviprex -> Neo  BP goal < 160 Avoid low BP CCM on board Long term BP goal normotensive  Hyperlipidemia Home meds: Lipitor 10 LDL 149, goal < 70 Hold off Lipitor for now Consider to resume statin at discharge  Dysphagia Was on dysphagia 1 with nectar thick liquid Not n.p.o. Speech on board Consider tube feeding  when stable  Other Stroke Risk Factors Advanced age Coronary artery disease/MI status post CABG x3 Obstructive sleep apnea, on CPAP at home  Other Active Problems MGUS Hypokalemia, K 3.2-> supplement ->3.6   Hospital day # 3  This patient is critically ill due to respiratory failure, right large BG ICH, aspiration, AKI, hypotension, cerebral edema new diagnosed A. fib and at significant risk of neurological  worsening, death form brain herniation, hematoma expansion, renal failure, recurrent stroke, heart failure. This patient's care requires constant monitoring of vital signs, hemodynamics, respiratory and cardiac monitoring, review of multiple databases, neurological assessment, discussion with family, other specialists and medical decision making of high complexity. I spent 55 minutes of neurocritical care time in the care of this patient.  I discussed with Dr. Lynetta Mare. I had long discussion with wife [name] over the phone, updated pt current condition, treatment plan and potential prognosis, and answered all the questions.  She expressed understanding and appreciation.     Rosalin Hawking, MD PhD Stroke Neurology 04/14/2021 11:21 AM    To contact Stroke Continuity provider, please refer to [email or web address] After hours, contact General Neurology

## 2021-04-14 NOTE — Progress Notes (Addendum)
[facility] Progress Note Patient Name: Richard Summers DOB: May 14, 1941 MRN: [id number]   Date of Service  04/14/2021  HPI/Events of Note  Serum sodium is 167 on ABG + electrolytes  (ISTAT), and patient is developing a hyperchloremic non-anion gap metabolic acidosis (chloride is 130).  eICU Interventions  Hypertonic saline gtt paused  and a BMP will be done in 4 hours instead of the scheduled 6 hours.        [name] Jenyfer Trawick 04/14/2021, 5:47 AM

## 2021-04-14 NOTE — Progress Notes (Signed)
Cannulated artery, needle removed, pressure applied. Hemostasis obtained. Rn made aware and will watch out for bleeding.

## 2021-04-14 NOTE — Progress Notes (Signed)
RT NOTES: Withdrew ETT from 24cm at the lip to 23cm at the lip per orders.

## 2021-04-14 NOTE — Progress Notes (Signed)
PT Cancellation Note  Patient Details Name: Armel Rabbani MRN: [id number] DOB: 1941-08-26   Cancelled Treatment:    Reason Eval/Treat Not Completed: Medical issues which prohibited therapy. Pt intubated last night due to respiratory failure, currently on vent FiO2 70% PEEP 5. RN requesting hold this date. Will plan to follow-up another day as appropriate.   Moishe Spice, PT, DPT Acute Rehabilitation Services  Pager: [phone] Office: [place] 04/14/2021, 8:28 AM

## 2021-04-14 NOTE — Progress Notes (Signed)
[facility] Progress Note Patient Name: Parks Czajkowski DOB: 1941-04-22 MRN: [id number]   Date of Service  04/14/2021  HPI/Events of Note  MAP transiently < 60 (SBP 82 mmHg), patient off Cleviprex for > 1 hour and sedation halved from baseline.  eICU Interventions  Stat ABG to r/o acidosis fueling the hypotension. Phenylephrine gtt has been added to the [facility], to be started only for persistent MAP < 65 mmHg, Neurology target for BP per bedside RN is MAP > 65 mmHg and SBP  < 160 mmHg.        [name] Zyiere [place] 04/14/2021, 5:20 AM

## 2021-04-14 NOTE — Progress Notes (Signed)
NAME:  Richard Summers, MRN:  [id number], DOB:  [birth year], LOS: 3 ADMISSION DATE:  04/12/2021, CONSULTATION DATE:  7/14 REFERRING MD:  Dr. Cheral Marker, CHIEF COMPLAINT:  ICH   History of Present Illness:  Patient is encephalopathic and/or intubated. Therefore history has been obtained from chart review.   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  According to reports the patient's wife heard him fall while taking a shower and called EMS.  Upon their arrival he was unable to move his left side.  He was transported to [facility] emergency department as a code stroke and CT scan upon arrival demonstrated intercranial hemorrhage.  Cerebral edema also demonstrated on scan.  He was admitted to the neuro ICU for close monitoring and Cleviprex infusion for blood pressure goal less than 140 mmHg. He was started on hypertonic saline for worsening cerebral edema. 4/14 there was some concern for his airway. He was coughing after drinking nectar thick liquid and having upper airway secretions despite suctioning. When he became hypoxic requiring increased oxygen, PCCM was consulted.   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   Significant Hospital Events: Including procedures, antibiotic start and stop dates in addition to other pertinent events   7/12 admitted with [place] 7/14 intubated for airway  protection given worsening hypoxia and witnessed aspiration; started on levaquin    Interim History / Subjective:  Tmax 101  HTS stopped at 0550 given Na 161, worsening hyperchloremic non gap acidosis Urinary retention 1300-> placing foley now sCr 2.34-> 2.77 Remains afebrile Down from 1.0 to 50% FiO2 on vent Progressively hypotensive, off cleviprex.  Now needing pressors  Going for repeat CTH this am   Objective   Blood pressure 90/61, pulse 79, temperature 99 F (37.2 C), temperature source Oral, resp. rate (!) 30, height 5\' 6"  (1.676 m), weight 74.8 kg, SpO2 98 %.    Vent Mode: PRVC FiO2 (%):  [50 %-100 %] 50 % Set Rate:  [18 bmp-22 bmp] 22 bmp Vt Set:  [510 mL] 510 mL PEEP:  [5 cmH20] 5 cmH20 Plateau Pressure:  [17 cmH20-22 cmH20] 22 cmH20   Intake/Output Summary (Last 24 hours) at 04/14/2021 1203 Last data filed at 04/14/2021 1100 Gross per 24 hour  Intake 1643.86 ml  Output 1600 ml  Net 43.86 ml   Filed Weights   04/05/2021 1821  Weight: 74.8 kg    Examination: Off propofol General:  Elderly male on MV in NAD HEENT: MM pink/moist, ETT/ OGT, pupils 3/reactive  Neuro: will open right eye to verbal, follows simple commands, sticks tongue out- right deviation noted, weak grip in right hand, wiggles right foot, flaccid on left  CV: ST/ NSR, no murmur PULM:  tachypneic- breathing over set MV rate in the upper 30's, some thick tan secretions, coarse throughout GI: soft, bs hypo, currently has ubag in place  Extremities: warm/dry, poor cap refill distally, no LE edema Skin: no rashes    7/14 levaquin (PCN /sulfur allergy (anaphylaxis, no prior documentation of cephalosporins)>>  7/13 MRSA pcr > neg 7/14 trach asp >>  Ct head 7/12 > Acute parenchymal hemorrhage centered in the right basal ganglia region with mild edema and regional mass effect MRI brain 7/14 > Right lentiform intra-axial hemorrhage with estimated blood volume 40 mL is stable since yesterday.  Susceptibility along the margins on DWI with no larger area of infarction. Stable intracranial mass effect with mild leftward midline shift.  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 Now complicated by cerebral edema.  - Stroke service primary.  - BP control < 175mmHg - HCTZ and losartan d/c'd given worsening hypotension and renal function - Clevidipine off - Hypertonic saline- stopped at 0550, Na 161 - increase Na to q4 hrs  - pending repeat CTH this am    Acute hypoxemic respiratory failure secondary to aspiration. Aspirating throughout the day 7/14 and then aspirated during intubation -Continue MV support, 8cc/kg IBW with goal Pplat <30 and DP<15  -VAP prevention protocol/ PPI -PAD protocol for sedation> d/c propofol given hypotension; change to low dose fentanyl gtt, RASS goal 0/-1 w/ bowel regimen  -wean FiO2 as able for SpO2 >92%  -daily SAT & SBT  - Levaquin for aspiration coverage (PCN allergy anaphylaxis) - follow trach asp - intermittent CXR/ ABG (metabolic acidosis on am ABG, pt attempting to compensate with his own RR drive)  - prn BD   Proximal Atrial fibrillation: newly diagnosed - Telemetry monitoring - Not a candidate for [facility] at this time.  - currently in NSR  Seizure history - Continue home Keppra - seizure precautions   AKI likely secondary to urinary retention/ developing sepsis Hyperchloremic NGA Hypokalemia Iatrogenic hypernatremia  Hypophos  - replace w/ Kphos, Mag ok at 2.1 - LR 500 ml bolus, then at 100 ml/hr - adding free water 200 ml q 4 per tube  - Trend BMP / urinary output - Replace electrolytes as indicated - Avoid nephrotoxic agents, ensure adequate renal perfusion   Hypotension/ likely developing septic shock related to aspiration  - UA neg 7/13 - TTE 7/13- EF 45%, RWA of basal inferior akinesis, basal to mid  inferolateral akinesis, and basal anterolateral hypokinesis, mild LVH, G1DD, normal RV - LR bolus 500 ml x 1, adding LR at 100 ml/hr after discussing with Dr. Lynetta Mare - increase Na to q4 to avoid sodium overcorrection but patient appears to be hypovolemic  - will place PICC for better access - prn Neosynephrine for MAP goal > 65 - follow trach asp/ continue levaquin as above  Best Practice (right click and "Reselect all SmartList Selections" daily)   Diet/type: NPO; start TF today DVT prophylaxis: SCD GI prophylaxis: PPI Lines: N/A Foley:  N/A; inserting 7/15 2/2 urinary rentention/ AKI Code Status:  full code Last date of multidisciplinary goals of care discussion [ per primary; wife, [name] [phone], is currently hospitalized at [facility], updated by Dr. Erlinda Hong 7/15   Labs   CBC: Recent Labs  Lab 04/12/2021 1756 04/26/2021 1804 04/12/21 0817 04/13/21 0238 04/13/21 2320 04/14/21 0409 04/14/21 0518  WBC 7.5  --  8.5 10.0  --  5.0  --   NEUTROABS 6.2  --   --   --   --   --   --   HGB 16.0   < > 16.6 15.5 13.6 16.1 14.6  HCT 47.4   < > 47.7 45.0 40.0 49.2 43.0  MCV 93.3  --  91.2 92.0  --  98.0  --   PLT 160  --  174 173  --  146*  --    < > = values in this interval not displayed.    Basic Metabolic Panel: Recent Labs  Lab 04/12/21 0817 04/12/21 1528 04/13/21 0238 04/13/21 0829 04/13/21 2225 04/13/21 2320 04/14/21 0409 04/14/21 0518 04/14/21 0834  NA 137   < > 147*   < > 153* 163* 161* 167* 161*  K 3.4*  --  3.2*  --  3.1* 2.9* 3.6 3.6 3.6  CL 103  --  116*  --  127*  --  >130*  --  >130*  CO2 24  --  20*  --  14*  --  19*  --  16*  GLUCOSE 130*  --  151*  --  258*  --  110*  --  145*  BUN 15  --  18  --  29*  --  33*  --  39*  CREATININE 0.75  --  1.12  --  1.79*  --  2.34*  --  2.77*  CALCIUM 8.8*  --  8.5*  --  8.3*  --  8.2*  --  8.4*  MG  --   --   --   --   --   --   --   --  2.1  PHOS  --   --   --   --   --   --   --   --  1.9*   < > = values in this interval  not displayed.   GFR: Estimated Creatinine Clearance: 19.5 mL/min (A) (by C-G formula based on SCr of 2.77 mg/dL (H)). Recent Labs  Lab 03/31/2021 1756 04/12/21 0817 04/13/21 0238 04/14/21 0409  WBC 7.5 8.5 10.0 5.0    Liver Function Tests: Recent Labs  Lab 04/10/2021 1756  AST 22  ALT 20  ALKPHOS 59  BILITOT 1.8*  PROT 7.0  ALBUMIN 4.1   No results for input(s): LIPASE, AMYLASE in the last 168 hours. No results for input(s): AMMONIA in the last 168 hours.  ABG    Component Value Date/Time   PHART 7.259 (L) 04/14/2021 0518   PCO2ART 39.8 04/14/2021 0518   PO2ART 90 04/14/2021 0518   HCO3 17.8 (L) 04/14/2021 0518   TCO2 19 (L) 04/14/2021 0518   ACIDBASEDEF 9.0 (H) 04/14/2021 0518   O2SAT 96.0 04/14/2021 0518     Coagulation Profile: Recent Labs  Lab 04/24/2021 1756  INR 1.0    Cardiac Enzymes: No results for input(s): CKTOTAL, CKMB, CKMBINDEX, TROPONINI in the last 168 hours.  HbA1C: Hgb A1c MFr Bld  Date/Time Value Ref Range Status  04/13/2021 02:38 AM 6.2 (H) 4.8 - 5.6 % Final    Comment:    (NOTE) Pre diabetes:          5.7%-6.4%  Diabetes:              >6.4%  Glycemic control for   <7.0% adults with diabetes     CBG: Recent Labs  Lab 04/13/21 1924 04/13/21 2307 04/14/21 0313 04/14/21 0731 04/14/21 1115  GLUCAP 286* 204* 109* 125* 135*    Critical care time: 45 minutes    Kennieth Rad, ACNP [facility] 04/14/2021, 12:04 PM  See Amion for pager If no response to pager, please call PCCM consult pager After 7:00 pm call [place]

## 2021-04-14 NOTE — Plan of Care (Signed)
  Problem: Nutrition: Goal: Adequate nutrition will be maintained Outcome: Progressing   Problem: Activity: Goal: Risk for activity intolerance will decrease Outcome: Not Progressing   Problem: Self-Care: Goal: Ability to communicate needs accurately will improve Outcome: Not Progressing

## 2021-04-14 NOTE — Progress Notes (Signed)
OT Cancellation Note  Patient Details Name: Richard Summers MRN: [id number] DOB: [birth year]   Cancelled Treatment:    Reason Eval/Treat Not Completed: Patient not medically ready (Pt now intubated. Will follow up next week)  [place], OT/L   Acute OT Clinical Specialist [place] Pager [phone] Office [phone]  04/14/2021, 2:22 PM

## 2021-04-15 ENCOUNTER — Inpatient Hospital Stay (HOSPITAL_COMMUNITY): Payer: Medicare Other

## 2021-04-15 DIAGNOSIS — I619 Nontraumatic intracerebral hemorrhage, unspecified: Secondary | ICD-10-CM | POA: Diagnosis not present

## 2021-04-15 DIAGNOSIS — T17908A Unspecified foreign body in respiratory tract, part unspecified causing other injury, initial encounter: Secondary | ICD-10-CM | POA: Diagnosis not present

## 2021-04-15 LAB — BASIC METABOLIC PANEL
BUN: 39 mg/dL — ABNORMAL HIGH (ref 8–23)
BUN: 37 mg/dL — ABNORMAL HIGH (ref 8–23)
CO2: 22 mmol/L (ref 22–32)
CO2: 25 mmol/L (ref 22–32)
Calcium: 7.9 mg/dL — ABNORMAL LOW (ref 8.9–10.3)
Calcium: 7.9 mg/dL — ABNORMAL LOW (ref 8.9–10.3)
Chloride: >130 mmol/L (ref 98–111)
Chloride: >130 mmol/L (ref 98–111)
Creatinine, Ser: 1.54 mg/dL — ABNORMAL HIGH (ref 0.61–1.24)
Creatinine, Ser: 1.22 mg/dL (ref 0.61–1.24)
GFR, Estimated: 46 mL/min — ABNORMAL LOW (ref >60)
GFR, Estimated: >60 mL/min (ref >60)
Glucose, Bld: 116 mg/dL — ABNORMAL HIGH (ref 70–99)
Glucose, Bld: 183 mg/dL — ABNORMAL HIGH (ref 70–99)
Potassium: 2.9 mmol/L — ABNORMAL LOW (ref 3.5–5.1)
Potassium: 3.1 mmol/L — ABNORMAL LOW (ref 3.5–5.1)
Sodium: 161 mmol/L (ref 135–145)
Sodium: 160 mmol/L — ABNORMAL HIGH (ref 135–145)

## 2021-04-15 LAB — PHOSPHORUS
Phosphorus: 2.4 mg/dL — ABNORMAL LOW (ref 2.5–4.6)
Phosphorus: 2.6 mg/dL (ref 2.5–4.6)
Phosphorus: 1.4 mg/dL — ABNORMAL LOW (ref 2.5–4.6)

## 2021-04-15 LAB — MAGNESIUM
Magnesium: 1.8 mg/dL (ref 1.7–2.4)
Magnesium: 1.9 mg/dL (ref 1.7–2.4)
Magnesium: 2.4 mg/dL (ref 1.7–2.4)

## 2021-04-15 LAB — CBC
HCT: 39.9 % (ref 39.0–52.0)
Hemoglobin: 13.7 g/dL (ref 13.0–17.0)
MCH: 32.5 pg (ref 26.0–34.0)
MCHC: 34.3 g/dL (ref 30.0–36.0)
MCV: 94.5 fL (ref 80.0–100.0)
Platelets: 77 10*3/uL — ABNORMAL LOW (ref 150–400)
RBC: 4.22 MIL/uL (ref 4.22–5.81)
RDW: 14.4 % (ref 11.5–15.5)
WBC: 10.6 10*3/uL — ABNORMAL HIGH (ref 4.0–10.5)
nRBC: 0 % (ref 0.0–0.2)

## 2021-04-15 LAB — GLUCOSE, CAPILLARY
Glucose-Capillary: 114 mg/dL — ABNORMAL HIGH (ref 70–99)
Glucose-Capillary: 146 mg/dL — ABNORMAL HIGH (ref 70–99)
Glucose-Capillary: 186 mg/dL — ABNORMAL HIGH (ref 70–99)
Glucose-Capillary: 177 mg/dL — ABNORMAL HIGH (ref 70–99)
Glucose-Capillary: 175 mg/dL — ABNORMAL HIGH (ref 70–99)
Glucose-Capillary: 177 mg/dL — ABNORMAL HIGH (ref 70–99)

## 2021-04-15 LAB — SODIUM: Sodium: 159 mmol/L — ABNORMAL HIGH (ref 135–145)

## 2021-04-15 MED ORDER — AMLODIPINE BESYLATE 5 MG PO TABS
5.0000 mg | ORAL_TABLET | Freq: Every day | ORAL | Status: DC
  Administered 2021-04-15: 5 mg
  Filled 2021-04-15: qty 1

## 2021-04-15 MED ORDER — POTASSIUM CHLORIDE 10 MEQ/50ML IV SOLN
10.0000 meq | INTRAVENOUS | Status: AC
  Administered 2021-04-15 – 2021-04-16 (×4): 10 meq via INTRAVENOUS
  Filled 2021-04-15 (×4): qty 50

## 2021-04-15 MED ORDER — FREE WATER
400.0000 mL | Freq: Four times a day (QID) | Status: DC
  Administered 2021-04-15 – 2021-04-18 (×12): 400 mL

## 2021-04-15 MED ORDER — K PHOS MONO-SOD PHOS DI & MONO 155-852-130 MG PO TABS
250.0000 mg | ORAL_TABLET | Freq: Three times a day (TID) | ORAL | Status: DC
  Filled 2021-04-15: qty 1

## 2021-04-15 MED ORDER — DEXMEDETOMIDINE HCL IN NACL 400 MCG/100ML IV SOLN
0.4000 ug/kg/h | INTRAVENOUS | Status: DC
  Administered 2021-04-16: 0.4 ug/kg/h via INTRAVENOUS
  Filled 2021-04-15 (×2): qty 100

## 2021-04-15 MED ORDER — PHENYLEPHRINE HCL-NACL 10-0.9 MG/250ML-% IV SOLN
0.0000 ug/min | INTRAVENOUS | Status: DC
  Administered 2021-04-15: 10 ug/min via INTRAVENOUS
  Filled 2021-04-15: qty 250

## 2021-04-15 MED ORDER — POTASSIUM & SODIUM PHOSPHATES 280-160-250 MG PO PACK
1.0000 | PACK | Freq: Three times a day (TID) | ORAL | Status: AC
  Administered 2021-04-15 (×3): 1
  Filled 2021-04-15 (×3): qty 1

## 2021-04-15 MED ORDER — MAGNESIUM SULFATE 2 GM/50ML IV SOLN
2.0000 g | Freq: Once | INTRAVENOUS | Status: AC
  Administered 2021-04-15: 2 g via INTRAVENOUS
  Filled 2021-04-15: qty 50

## 2021-04-15 MED ORDER — POTASSIUM CHLORIDE 20 MEQ PO PACK
20.0000 meq | PACK | ORAL | Status: AC
  Administered 2021-04-15 – 2021-04-16 (×2): 20 meq
  Filled 2021-04-15 (×2): qty 1

## 2021-04-15 MED ORDER — POTASSIUM CHLORIDE 20 MEQ PO PACK
40.0000 meq | PACK | Freq: Two times a day (BID) | ORAL | Status: AC
  Administered 2021-04-15 (×2): 40 meq
  Filled 2021-04-15 (×2): qty 2

## 2021-04-15 MED ORDER — METOPROLOL TARTRATE 5 MG/5ML IV SOLN
2.5000 mg | Freq: Four times a day (QID) | INTRAVENOUS | Status: DC | PRN
  Administered 2021-04-15: 2.5 mg via INTRAVENOUS
  Filled 2021-04-15: qty 5

## 2021-04-15 MED ORDER — CALCIUM GLUCONATE-NACL 1-0.675 GM/50ML-% IV SOLN
1.0000 g | Freq: Once | INTRAVENOUS | Status: AC
  Administered 2021-04-15: 1000 mg via INTRAVENOUS
  Filled 2021-04-15: qty 50

## 2021-04-15 NOTE — Progress Notes (Addendum)
[facility] Progress Note Patient Name: Richard Summers DOB: 02-Apr-1941 MRN: [id number]   Date of Service  04/15/2021  HPI/Events of Note  K+ 3.1, Ca++ 7.9  eICU Interventions  K+ replaced per E-Link adult electrolyte replacement protocol. Calcium gluconate 1 gm iv x 1.        Richard Summers Richard Summers 04/15/2021, 9:05 PM

## 2021-04-15 NOTE — Progress Notes (Signed)
NAME:  Richard Summers, MRN:  [id number], DOB:  [birth year], LOS: 4 ADMISSION DATE:  04/13/2021, CONSULTATION DATE:  7/14 REFERRING MD:  Dr. Cheral Marker, CHIEF COMPLAINT:  ICH   History of Present Illness:  Patient is encephalopathic and/or intubated. Therefore history has been obtained from chart review.   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  According to reports the patient's wife heard him fall while taking a shower and called EMS.  Upon their arrival he was unable to move his left side.  He was transported to [facility] emergency department as a code stroke and CT scan upon arrival demonstrated intercranial hemorrhage.  Cerebral edema also demonstrated on scan.  He was admitted to the neuro ICU for close monitoring and Cleviprex infusion for blood pressure goal less than 140 mmHg. He was started on hypertonic saline for worsening cerebral edema. 4/14 there was some concern for his airway. He was coughing after drinking nectar thick liquid and having upper airway secretions despite suctioning. When he became hypoxic requiring increased oxygen, PCCM was consulted.   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   Significant Hospital Events: Including procedures, antibiotic start and stop dates in addition to other pertinent events   7/12 admitted with [place] 7/14 intubated for airway  protection given worsening hypoxia and witnessed aspiration; started on levaquin   7/15 tmax 101, HTS off, urinary retention/ sCr up -foley replaced, off cleviprex but hypotensive requiring vasopressors, repeated CTH, R PICC placed  Interim History / Subjective:  Restarted on HTS 2159 but then stopped 0522 given Na within goal again Remains on low dose fentanyl gtt 15 mcg/hr- following commands on right Tmax 100.6 Neo at 40 mcg/min UOP better 3.6L/ 24hr +215ml/ net +3.1L  Objective   Blood pressure (!) 145/65, pulse 83, temperature 98.4 F (36.9 C), temperature source Axillary, resp. rate (!) 23, height 5\' 6"  (1.676 m), weight 74.8 kg, SpO2 96 %.    Vent Mode: PRVC FiO2 (%):  [50 %-70 %] 50 % Set Rate:  [22 bmp] 22 bmp Vt Set:  [510 mL] 510 mL PEEP:  [5 cmH20] 5 cmH20 Plateau Pressure:  [12 cmH20-20 cmH20] 12 cmH20   Intake/Output Summary (Last 24 hours) at 04/15/2021 0728 Last data filed at 04/15/2021 0600 Gross per 24 hour  Intake 3614.4 ml  Output 3600 ml  Net 14.4 ml   Filed Weights   04/09/2021 1821  Weight: 74.8 kg    Examination: On fentanyl gtt at 15 mcg/hr General:  critically ill elderly male in NAD on MV HEENT: MM pink/moist, ETT/ OGT, pupils 3/reactive, anicteric  Neuro: will open eyes to verbal and f/c on RUE and RLE- 3/5, flaccid on LUE/LLL CV:  rr ir, NSR with PAC, no murmur PULM:  tachypneic over MV rate, scant secretions, coarse GI: obese, soft, bs active, foley  Extremities: warm/dry, developing generalized edema  Skin: no rashes   PICC RUE   7/14 levaquin (PCN /sulfur allergy (anaphylaxis, no prior documentation of cephalosporins)>>  7/13 MRSA pcr > neg 7/14 trach asp >>  Ct head  7/12 > Acute parenchymal hemorrhage centered in the right basal ganglia region with mild edema and regional mass effect MRI brain 7/14 > Right lentiform intra-axial hemorrhage with estimated blood volume 40 mL is stable since yesterday. Susceptibility along the margins on  DWI with no larger area of infarction. Stable intracranial mass effect with mild leftward midline shift.  [facility] 7/15 >>Large hematoma right external capsule unchanged in size. There is increased mass-effect and edema with mild midline shift to the left.   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 Now complicated by cerebral edema.  - Stroke service primary.  - BP control < 144mmHg; MAP > 65- currently on low dose neosynephrine for BP goals - Hypertonic saline- per Neurology, q 4 Na - serial neuro exams - further imaging per neuro  Acute hypoxemic respiratory failure secondary to aspiration. Aspirating throughout the day 7/14 and then aspirated during intubation -Continue MV support, 8cc/kg IBW with goal Pplat <30 and DP<15  -VAP prevention protocol/ PPI -PAD protocol for sedation> continue low dose fentanyl gtt, RASS goal 0/-1 w/ bowel regimen  -wean FiO2 as able for SpO2 >92%  -daily SAT & SBT - not meeting criteria today - Levaquin for aspiration coverage (PCN allergy anaphylaxis) - follow trach asp - intermittent CXR/ ABG  - prn BD  Proximal Atrial fibrillation: newly diagnosed - Telemetry monitoring - Not a candidate for AC at this time.  - remains in NSR  Seizure history - Continue home Keppra - seizure precautions   AKI likely secondary to urinary retention/ developing sepsis Hyperchloremic NGA Hypokalemia Iatrogenic hypernatremia  Hypophos  - continue FWF 469ml q 6hr - replete kphos - improving renal function and UOP - Trend BMP / urinary output - Replace electrolytes as indicated - Avoid nephrotoxic agents, ensure adequate renal perfusion  Hypotension/ likely developing septic shock related to aspiration  - UA neg 7/13 - TTE 7/13- EF 45%, RWA of basal inferior akinesis, basal to mid inferolateral akinesis, and basal anterolateral hypokinesis,  mild LVH, G1DD, normal RV - LR bolus 500 ml x 1, adding LR at 100 ml/hr after discussing with Dr. Lynetta Mare - increase Na to q4 to avoid sodium overcorrection but patient appears to be hypovolemic  - intermittently on Neosynephrine for MAP goal > 65 - follow trach asp/ continue levaquin as above  Best Practice (right click and "Reselect all SmartList Selections" daily)   Diet/type: NPO; continue TF DVT prophylaxis: SCD GI prophylaxis: PPI Lines: Central line, RUE PICC  Foley:  Yes, and it is still needed; inserted 7/15 2/2 urinary rentention/ AKI Code Status:  full code Last date of multidisciplinary goals of care discussion per primary; wife, [name] [phone], is [facility] hospitalized [facility], updated by Dr. Erlinda Hong 7/15   Labs   CBC: Recent Labs  Lab 04/07/2021 1756 04/13/2021 1804 04/12/21 0817 04/13/21 0238 04/13/21 2320 04/14/21 0409 04/14/21 0518 04/15/21 0404  WBC 7.5  --  8.5 10.0  --  5.0  --  10.6*  NEUTROABS 6.2  --   --   --   --   --   --   --   HGB 16.0   < > 16.6 15.5 13.6 16.1 14.6 13.7  HCT 47.4   < > 47.7 45.0 40.0 49.2 43.0 39.9  MCV 93.3  --  91.2 92.0  --  98.0  --  94.5  PLT  160  --  174 173  --  146*  --  PENDING   < > = values in this interval not displayed.    Basic Metabolic Panel: Recent Labs  Lab 04/13/21 0238 04/13/21 0829 04/13/21 2225 04/13/21 2320 04/14/21 0409 04/14/21 0518 04/14/21 0834 04/14/21 1345 04/14/21 1639 04/14/21 2004 04/14/21 2338 04/15/21 0404 04/15/21 0411  NA 147*   < > 153* 163* 161* 167* 161* 161* 153* 151* 159* 161*  --   K 3.2*  --  3.1* 2.9* 3.6 3.6 3.6  --   --   --   --  2.9*  --   CL 116*  --  127*  --  >130*  --  >130*  --   --   --   --  >130*  --   CO2 20*  --  14*  --  19*  --  16*  --   --   --   --  22  --   GLUCOSE 151*  --  258*  --  110*  --  145*  --   --   --   --  116*  --   BUN 18  --  29*  --  33*  --  39*  --   --   --   --  39*  --   CREATININE 1.12  --  1.79*  --  2.34*  --  2.77*  --   --   --    --  1.54*  --   CALCIUM 8.5*  --  8.3*  --  8.2*  --  8.4*  --   --   --   --  7.9*  --   MG  --   --   --   --   --   --  2.1  --  1.7  --   --  1.9 1.8  PHOS  --   --   --   --   --   --  1.9*  --  >30.0*  --   --  2.6 2.4*   < > = values in this interval not displayed.   GFR: Estimated Creatinine Clearance: 35.1 mL/min (A) (by C-G formula based on SCr of 1.54 mg/dL (H)). Recent Labs  Lab 04/12/21 0817 04/13/21 0238 04/14/21 0409 04/15/21 0404  WBC 8.5 10.0 5.0 10.6*    Liver Function Tests: Recent Labs  Lab 04/07/2021 1756  AST 22  ALT 20  ALKPHOS 59  BILITOT 1.8*  PROT 7.0  ALBUMIN 4.1   No results for input(s): LIPASE, AMYLASE in the last 168 hours. No results for input(s): AMMONIA in the last 168 hours.  ABG    Component Value Date/Time   PHART 7.259 (L) 04/14/2021 0518   PCO2ART 39.8 04/14/2021 0518   PO2ART 90 04/14/2021 0518   HCO3 17.8 (L) 04/14/2021 0518   TCO2 19 (L) 04/14/2021 0518   ACIDBASEDEF 9.0 (H) 04/14/2021 0518   O2SAT 96.0 04/14/2021 0518     Coagulation Profile: Recent Labs  Lab 04/15/2021 1756  INR 1.0    Cardiac Enzymes: No results for input(s): CKTOTAL, CKMB, CKMBINDEX, TROPONINI in the last 168 hours.  HbA1C: Hgb A1c MFr Bld  Date/Time Value Ref Range Status  04/13/2021 02:38 AM 6.2 (H) 4.8 - 5.6 % Final    Comment:    (NOTE) Pre diabetes:          5.7%-6.4%  Diabetes:              >  6.4%  Glycemic control for   <7.0% adults with diabetes     CBG: Recent Labs  Lab 04/14/21 1115 04/14/21 1607 04/14/21 2035 04/14/21 2310 04/15/21 0419  GLUCAP 135* 137* 174* 154* 114*    Critical care time: 30 minutes    Kennieth Rad, ACNP [facility] 04/15/2021, 7:28 AM  See Amion for pager If no response to pager, please call PCCM consult pager After 7:00 pm call Elink

## 2021-04-15 NOTE — Progress Notes (Signed)
STROKE TEAM PROGRESS NOTE   SUBJECTIVE (INTERVAL HISTORY) His RN is at the bedside. Pt did not do well on weaning trial and is back on full ventilatory support for his respiratory failure.  Also low BP on propofol and fentanyl, requiring pressor support with phenylephrine which is being weaned off.  And high Na 161, hypertonic saline drip has been discontinued .  Neurological exam remains unchanged patient is arousable following commands consistently on the right and remains with dense left hemiplegia and neglect.  Renal function much improved to 1.54 today  OBJECTIVE Temp:  [98.4 F (36.9 C)-100.6 F (38.1 C)] 100.6 F (38.1 C) (07/16 0800) Pulse Rate:  [66-100] 100 (07/16 1106) Cardiac Rhythm: Normal sinus rhythm (07/16 0400) Resp:  [21-32] 32 (07/16 1106) BP: (94-155)/(57-83) 150/75 (07/16 1106) SpO2:  [91 %-98 %] 94 % (07/16 1106) FiO2 (%):  [40 %-50 %] 40 % (07/16 1106)  Recent Labs  Lab 04/14/21 2035 04/14/21 2310 04/15/21 0419 04/15/21 0726 04/15/21 1151  GLUCAP 174* 154* 114* 146* 186*   Recent Labs  Lab 04/13/21 0238 04/13/21 0829 04/13/21 2225 04/13/21 2320 04/14/21 0409 04/14/21 0518 04/14/21 0834 04/14/21 1345 04/14/21 1639 04/14/21 2004 04/14/21 2338 04/15/21 0404 04/15/21 0411  NA 147*   < > 153* 163* 161* 167* 161* 161* 153* 151* 159* 161*  --   K 3.2*  --  3.1* 2.9* 3.6 3.6 3.6  --   --   --   --  2.9*  --   CL 116*  --  127*  --  >130*  --  >130*  --   --   --   --  >130*  --   CO2 20*  --  14*  --  19*  --  16*  --   --   --   --  22  --   GLUCOSE 151*  --  258*  --  110*  --  145*  --   --   --   --  116*  --   BUN 18  --  29*  --  33*  --  39*  --   --   --   --  39*  --   CREATININE 1.12  --  1.79*  --  2.34*  --  2.77*  --   --   --   --  1.54*  --   CALCIUM 8.5*  --  8.3*  --  8.2*  --  8.4*  --   --   --   --  7.9*  --   MG  --   --   --   --   --   --  2.1  --  1.7  --   --  1.9 1.8  PHOS  --   --   --   --   --   --  1.9*  --  >30.0*  --    --  2.6 2.4*   < > = values in this interval not displayed.   Recent Labs  Lab 04/14/2021 1756  AST 22  ALT 20  ALKPHOS 59  BILITOT 1.8*  PROT 7.0  ALBUMIN 4.1   Recent Labs  Lab 04/28/2021 1756 04/18/2021 1804 04/12/21 0817 04/13/21 0238 04/13/21 2320 04/14/21 0409 04/14/21 0518 04/15/21 0404  WBC 7.5  --  8.5 10.0  --  5.0  --  10.6*  NEUTROABS 6.2  --   --   --   --   --   --   --  HGB 16.0   < > 16.6 15.5 13.6 16.1 14.6 13.7  HCT 47.4   < > 47.7 45.0 40.0 49.2 43.0 39.9  MCV 93.3  --  91.2 92.0  --  98.0  --  94.5  PLT 160  --  174 173  --  146*  --  77*   < > = values in this interval not displayed.   No results for input(s): CKTOTAL, CKMB, CKMBINDEX, TROPONINI in the last 168 hours. No results for input(s): LABPROT, INR in the last 72 hours.  No results for input(s): COLORURINE, LABSPEC, [place], GLUCOSEU, HGBUR, BILIRUBINUR, KETONESUR, PROTEINUR, UROBILINOGEN, NITRITE, LEUKOCYTESUR in the last 72 hours.  Invalid input(s): APPERANCEUR      Component Value Date/Time   CHOL 239 (H) 04/13/2021 0238   TRIG 62 04/14/2021 0409   HDL 72 04/13/2021 0238   CHOLHDL 3.3 04/13/2021 0238   VLDL 18 04/13/2021 0238   LDLCALC 149 (H) 04/13/2021 0238   Lab Results  Component Value Date   HGBA1C 6.2 (H) 04/13/2021      Component Value Date/Time   LABOPIA NONE DETECTED 04/12/2021 0107   COCAINSCRNUR NONE DETECTED 04/12/2021 0107   LABBENZ NONE DETECTED 04/12/2021 0107   AMPHETMU NONE DETECTED 04/12/2021 0107   THCU NONE DETECTED 04/12/2021 0107   LABBARB NONE DETECTED 04/12/2021 0107    No results for input(s): ETH in the last 168 hours.  I have personally reviewed the radiological images below and agree with the radiology interpretations.  CT ANGIO HEAD W OR WO CONTRAST  Result Date: 04/12/2021 CLINICAL DATA:  80 year old male code stroke presentation on 04/20/2021 with right lentiform intra-axial hemorrhage. Subsequent encounter. EXAM: CT ANGIOGRAPHY HEAD AND NECK  TECHNIQUE: Multidetector CT imaging of the head and neck was performed using the standard protocol during bolus administration of intravenous contrast. Multiplanar CT image reconstructions and MIPs were obtained to evaluate the vascular anatomy. Carotid stenosis measurements (when applicable) are obtained utilizing NASCET criteria, using the distal internal carotid diameter as the denominator. CONTRAST:  41mL OMNIPAQUE IOHEXOL 350 MG/ML SOLN COMPARISON:  Head CT 04/06/2021. FINDINGS: CT HEAD Brain: Calcified atherosclerosis at the skull base. Hyperdense intra-axial hemorrhage centered at the right lentiform encompasses 62 by 28 x 49 mm (AP by transverse by CC) for an estimated blood volume of 43 mL, and has mildly extended since presentation (approximately 31 mL at that time). But surrounding edema and regional mass effect have not significantly changed. Partially effaced right lateral ventricle with no ventriculomegaly. No intraventricular or extra-axial extension of blood. No significant midline shift. Basilar cisterns remain patent. Stable gray-white matter differentiation elsewhere. 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 Calvarium and skull base: Intact, negative. Paranasal sinuses: Continued paranasal sinus opacification maximal in the ethmoids. Tympanic cavities and mastoids remain clear. Orbits: Visualized orbits and scalp soft tissues are within normal limits. Negative noncontrast deep soft tissue spaces of the face. CTA NECK Skeleton: Right side TMJ degeneration. Widespread cervical spine degeneration. No acute or suspicious osseous lesion identified. Upper chest: Mild upper lobe lung scarring mostly on the left. Retained secretions, bubbly opacity in the visible trachea. No superior mediastinal lymphadenopathy. Other neck: Negative.  No neck mass or lymphadenopathy. Aortic arch: Extensive soft and calcified arch atherosclerosis. Three vessel arch configuration. Great vessel origins relatively  spared. Right carotid system: Mild brachiocephalic artery and right CCA origin atherosclerosis without stenosis. Mildly tortuous right CCA. Mostly calcified plaque at the right carotid bifurcation and posterior right ICA bulb without stenosis. Mildly tortuous  ICA to the skull base. Left carotid system: Minimal plaque at the left CCA origin without stenosis. Soft plaque in the medial vessel at the level of the larynx on series 5, image 131 without stenosis. Minor plaque at the left ICA origin without stenosis. Mildly tortuous left ICA to the skull base. Vertebral arteries: Soft and calcified plaque in the proximal right subclavian artery without stenosis. Right vertebral artery origin remains normal. Tortuous right V1 and proximal V2 segments. Patent right vertebral artery to the skull base without significant plaque or stenosis. Soft and calcified plaque in the proximal left subclavian artery without stenosis. Mild involvement of the left vertebral artery origin without stenosis. Tortuous left V1 and proximal V2 segments. Codominant left vertebral artery is patent to the skull base without significant plaque or stenosis. CTA HEAD Posterior circulation: Codominant distal vertebral arteries with mild calcified plaque mostly on the left. No significant stenosis. Patent PICA origins and vertebrobasilar junction. Patent basilar artery with mild irregularity but no significant stenosis. Patent SCA and right PCA origins. Fetal left PCA origin. Right posterior communicating artery diminutive or absent. Bilateral P2 segments are mildly irregular without stenosis. But there is bilateral P3 segment stenosis, mild to moderate involving the inferior division on the left (series 10, image 31) and moderate to severe involving the right P3 trunk on series 10, image 26. Anterior circulation: Both ICA siphons are patent. Abundant siphon calcified plaque on both sides but no significant stenosis on the left. There is moderate stenosis  of the right supraclinoid ICA on series 7, image 93. Normal left posterior communicating artery. Patent carotid termini. MCA and ACA origins are within normal limits. No convincing CTA spot sign associated with the right lentiform hemorrhage. Mildly dominant and tortuous right ACA A1 segment. Mild left A1 irregularity without stenosis. 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 Otherwise the bilateral ACA branches are within normal limits. Left MCA M1 segment and bifurcation are patent without stenosis. Left MCA branches are within normal limits. Right MCA M1 segment and bifurcation are patent without stenosis. Right MCA branches are within normal limits. Venous sinuses: Early contrast timing but grossly patent. Anatomic variants: Fetal left PCA origin. Mildly dominant right ACA A1. Review of the MIP images confirms the above findings IMPRESSION: 1. Right lentiform intra-axial hemorrhage has slightly enlarged since presentation, estimated blood volume now 43 mL (previously 31 mL). But there is no CTA spot sign, and mild regional edema and mass effect are stable. 2. 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 There is up to Moderate stenosis of the Right supraclinoid ICA due to calcified plaque. And there is Moderate To Severe bilateral PCA P3 segment stenosis. No significant Vertebrobasilar or cervical carotid stenosis. 3. Aortic Atherosclerosis (ICD10-I70.0). Electronically Signed   By: Genevie Ann M.D.   On: 04/12/2021 06:27   CT HEAD WO CONTRAST  Result Date: 04/14/2021 CLINICAL DATA:  Intracranial hemorrhage follow-up EXAM: CT HEAD WITHOUT CONTRAST TECHNIQUE: Contiguous axial images were obtained from the base of the skull through the vertex without intravenous contrast. COMPARISON:  CT head 04/15/2021.  MRI head 04/13/2021 FINDINGS: Brain: High-density acute hemorrhage in the right  lateral basal ganglia measures approximately 5.7 x 2.5 x 3.6 cm. Hematoma size is stable. There is surrounding low-density edema. There is progressive mass-effect on the right lateral ventricle. Mild midline shift to the left slightly increased. Chronic lacunar infarct in the  head of caudate on the left and in the left subinsular white matter, unchanged. No acute ischemic infarct or mass identified. Vascular: Negative for hyperdense vessel Skull: Negative Sinuses/Orbits: Mucosal  edema paranasal sinuses.  Negative orbit Other: None IMPRESSION: Large hematoma right external capsule unchanged in size. There is increased mass-effect and edema with mild midline shift to the left. Electronically Signed   By: Franchot Gallo M.D.   On: 04/14/2021 14:52   MR BRAIN WO CONTRAST  Result Date: 04/13/2021 CLINICAL DATA:  80 year old male code stroke presentation on 04/24/2021 with right lentiform intra-axial hemorrhage. EXAM: MRI HEAD WITHOUT CONTRAST TECHNIQUE: Multiplanar, multiecho pulse sequences of the brain and surrounding structures were obtained without intravenous contrast. COMPARISON:  CTA head and neck 04/12/2021 and earlier. FINDINGS: Brain: Mix signal intra-axial hemorrhage centered at the right lentiform encompasses 68 by 26 by 45 mm (AP by transverse by CC) for an estimated blood volume a 40 mL, stable since 04/12/2021. Surrounding edema. Susceptibility artifact along the margins of the hematoma on DWI. No larger area of restricted diffusion surrounding the blood. No restricted diffusion elsewhere. Stable mass effect on the right lateral ventricle. No intraventricular extension. No extra-axial extension. Mild leftward midline shift of 3-4 mm is stable. No ventriculomegaly. Basilar cisterns remain patent. No chronic cerebral blood products elsewhere on T2* imaging. There is evidence of chronic small vessel disease in the left corona radiata, caudate, and lentiform (series 7, image 17) with cystic  encephalomalacia and patchy FLAIR hyperintensity. No cortical encephalomalacia. Brainstem and cerebellum appear negative. Cervicomedullary junction and pituitary are within normal limits. Vascular: Major intracranial vascular flow voids are preserved. Skull and upper cervical spine: Negative visible cervical spine. Visualized bone marrow signal is within normal limits. Sinuses/Orbits: Negative orbits. Moderate ethmoid and sphenoid sinus mucosal thickening. Mild maxillary sinus involvement. Other: Mastoids are clear. Visible internal auditory structures appear normal. Negative visible scalp and face. IMPRESSION: 1. Right lentiform intra-axial hemorrhage with estimated blood volume 40 mL is stable since yesterday. Susceptibility along the margins on DWI with no larger area of infarction. Stable intracranial mass effect with mild leftward midline shift. 2. No underlying chronic cerebral blood products, but there are contralateral chronic lacunar infarcts in the left basal ganglia. Electronically Signed   By: Genevie Ann M.D.   On: 04/13/2021 11:50   DG CHEST PORT 1 VIEW  Result Date: 04/13/2021 CLINICAL DATA:  Check endotracheal tube placement EXAM: PORTABLE CHEST 1 VIEW COMPARISON:  04/12/2021 FINDINGS: Endotracheal tube is noted in place 16 mm above the carina. Gastric catheter is noted coiled within the stomach. Cardiac shadow is stable. The lungs are well aerated bilaterally. Mild central vascular congestion is seen without edema. IMPRESSION: Endotracheal tube in satisfactory position. Gastric catheter within the stomach. Mild vascular congestion without interstitial edema. Electronically Signed   By: Inez Catalina M.D.   On: 04/13/2021 21:55   DG CHEST PORT 1 VIEW  Result Date: 04/12/2021 CLINICAL DATA:  80 year old male with intracranial hemorrhage. Hypoxia. EXAM: PORTABLE CHEST 1 VIEW COMPARISON:  Chest radiographs 08/28/2006. CTA neck today reported separately. FINDINGS: Portable AP semi upright view at 0415  hours. Mildly lower lung volumes compared to 2007. Normal cardiac size and mediastinal contours. Visualized tracheal air column is within normal limits. Allowing for portable technique the lungs are clear. No pneumothorax or pleural effusion. Chronic left anterior rib cerclage wires are stable. Paucity of bowel gas in the upper abdomen. IMPRESSION: No acute cardiopulmonary abnormality. Electronically Signed   By: Herminio Heads.D.  On: 04/12/2021 06:29   DG Swallowing Func-Speech Pathology  Result Date: 04/12/2021 Formatting of this result is different from the original. Objective Swallowing Evaluation: Type of Study: MBS-Modified Barium Swallow Study  Patient Details Name: Richard Summers MRN: [id number] Date of Birth: 06-Mar-1941 Today's Date: 04/12/2021 Time: SLP Start Time (ACUTE ONLY): 27 -SLP Stop Time (ACUTE ONLY): 4098 SLP Time Calculation (min) (ACUTE ONLY): 16 min Past Medical History: Past Medical History: Diagnosis Date  Allergic rhinitis   Allergy   Barrett's esophagus   Cataract cortical, senile   Coronary artery disease involving coronary bypass graft of native heart   Epilepsy (HCC)   GERD (gastroesophageal reflux disease)   Glaucoma   Hyperlipidemia   Hypertension   Insomnia   MGUS (monoclonal gammopathy of unknown significance)   Myocardial infarction ([place])   Nephrolithiasis   Osteoporosis   S/P triple vessel bypass   Sleep apnea   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 Plates and screws installed  TONSILLECTOMY    triple bypass    VASECTOMY   HPI: 80 yr old man  with history of epilepsy, Barrett's esophagus, GERD, HTN, MI s/p bypass, and OSA who had a fall in the shower with sudden left sided  hemiplegia with a fixed rt gaze. CT revealed right Basal Ganglia hemorrhage. CT repeated  am 7/13 with right lentiform intra-axial hemorrhage has slightly enlarged since presentation.  No data recorded Assessment / Plan / Recommendation CHL IP CLINICAL IMPRESSIONS 04/12/2021 Clinical Impression Oral phase of swallow marked by decreased cohesion, ROM and manipulation resulted in anterior spill, left lateral sulci residue, lingual residue, holding and transit delays. Lingual residue spilled to valleculae and cleared with cues and spontaneous swallows. Pharyngeal and laryngeal mobility and strength were adequate once swallow was initiated. Poor timing to close larynx occured with thin liquid barium resulting in incomplete laryngeal closure and aspiration with reflexive cough (PAS 7) unable to clear trachea. Pt was very restless, pushing to his left, distracted and therapist unable to attempt compensatory strategies during study. Recommend Dys 1 (puree), nectar thick liquids, crush pills, check left buccal cavity for pocketing, limit distractions, straws allowed and full supervision and assist. SLP Visit Diagnosis Dysphagia, oropharyngeal phase (R13.12) Attention and concentration deficit following -- Frontal lobe and executive function deficit following -- Impact on safety and function Moderate aspiration risk;Mild aspiration risk   CHL IP TREATMENT RECOMMENDATION 04/12/2021 Treatment Recommendations Therapy as outlined in treatment plan below   Prognosis 04/12/2021 Prognosis for Safe Diet Advancement Good Barriers to Reach Goals Cognitive deficits Barriers/Prognosis Comment -- CHL IP DIET RECOMMENDATION 04/12/2021 SLP Diet Recommendations Dysphagia 1 (Puree) solids;Nectar thick liquid Liquid Administration via Straw;Cup Medication Administration Crushed with puree Compensations Minimize environmental distractions;Slow rate;Small sips/bites;Lingual sweep for clearance of pocketing;Monitor for anterior loss Postural Changes Seated upright at 90 degrees   CHL IP OTHER RECOMMENDATIONS 04/12/2021 Recommended  Consults -- Oral Care Recommendations Oral care BID Other Recommendations --   CHL IP FOLLOW UP RECOMMENDATIONS 04/12/2021 Follow up Recommendations Inpatient Rehab   CHL IP FREQUENCY AND DURATION 04/12/2021 Speech Therapy Frequency (ACUTE ONLY) min 2x/week Treatment Duration 2 weeks      CHL IP ORAL PHASE 04/12/2021 Oral Phase Impaired Oral - Pudding Teaspoon -- Oral - Pudding Cup --  Oral - Honey Teaspoon -- Oral - Honey Cup -- Oral - Nectar Teaspoon Left anterior bolus loss;Reduced posterior propulsion;Decreased bolus cohesion;Left pocketing in lateral sulci;Lingual/palatal residue Oral - Nectar Cup Left anterior bolus loss;Reduced posterior propulsion;Decreased bolus cohesion;Left pocketing in lateral sulci;Lingual/palatal residue Oral - Nectar Straw Reduced posterior propulsion;Delayed oral transit;Lingual/palatal residue Oral - Thin Teaspoon Left anterior bolus loss;Reduced posterior propulsion;Decreased bolus cohesion;Holding of bolus Oral - Thin Cup Left anterior bolus loss;Reduced posterior propulsion;Decreased bolus cohesion;Holding of bolus Oral - Thin Straw -- Oral - Puree Holding of bolus;Lingual/palatal residue;Delayed oral transit Oral - Mech Soft -- Oral - Regular Left pocketing in lateral sulci;Delayed oral transit Oral - Multi-Consistency -- Oral - Pill -- Oral Phase - Comment --  CHL IP PHARYNGEAL PHASE 04/12/2021 Pharyngeal Phase Impaired Pharyngeal- Pudding Teaspoon -- Pharyngeal -- Pharyngeal- Pudding Cup -- Pharyngeal -- Pharyngeal- Honey Teaspoon -- Pharyngeal -- Pharyngeal- Honey Cup -- Pharyngeal -- Pharyngeal- Nectar Teaspoon WFL Pharyngeal -- Pharyngeal- Nectar Cup Pharyngeal residue - valleculae Pharyngeal -- Pharyngeal- Nectar Straw WFL Pharyngeal -- Pharyngeal- Thin Teaspoon WFL Pharyngeal -- Pharyngeal- Thin Cup Penetration/Aspiration during swallow Pharyngeal Material enters airway, passes BELOW cords and not ejected out despite cough attempt by patient Pharyngeal- Thin Straw --  Pharyngeal -- Pharyngeal- Puree -- Pharyngeal -- Pharyngeal- Mechanical Soft -- Pharyngeal -- Pharyngeal- Regular -- Pharyngeal -- Pharyngeal- Multi-consistency -- Pharyngeal -- Pharyngeal- Pill -- Pharyngeal -- Pharyngeal Comment --  No flowsheet data found. Houston Siren 04/12/2021, 2:31 PM              ECHOCARDIOGRAM COMPLETE  Result Date: 04/12/2021    ECHOCARDIOGRAM REPORT   Patient Name:   Richard Summers Date of Exam: 04/12/2021 Medical Rec #:  [id number]      Height:       66.0 in Accession #:    [id number]     Weight:       164.9 lb Date of Birth:  02/22/1941      BSA:          1.842 m Patient Age:    38 years       BP:           146/75 mmHg Patient Gender: M              HR:           80 bpm. Exam Location:  Inpatient Procedure: 2D Echo, Cardiac Doppler and Color Doppler Indications:    Stroke I63.9  History:        Patient has no prior history of Echocardiogram examinations. CAD                 and Previous Myocardial Infarction; Risk Factors:Dyslipidemia                 and Hypertension.  Sonographer:    Bernadene Person RDCS Referring Phys: [id number] [place]  1. Left ventricular ejection fraction, by estimation, is 45%. The left ventricle demonstrates regional wall motion abnormalities with basal inferior akinesis, basal to mid inferolateral akinesis, basal anterolateral hypokinesis. There is mild left ventricular hypertrophy. Left ventricular diastolic parameters are consistent with Grade I diastolic dysfunction (impaired relaxation).  2. Right ventricular systolic function is normal. The right ventricular size is normal. Tricuspid regurgitation signal is inadequate for assessing PA pressure.  3. Left atrial size was mildly dilated.  4. The mitral valve is normal in structure. Trivial mitral valve regurgitation. No evidence of mitral stenosis.  5. The aortic valve is tricuspid.  Aortic valve regurgitation is not visualized. Mild aortic valve sclerosis is present, with no evidence  of aortic valve stenosis. FINDINGS  Left Ventricle: Left ventricular ejection fraction, by estimation, is 45%. The left ventricle has mildly decreased function. The left ventricle demonstrates regional wall motion abnormalities. The left ventricular internal cavity size was normal in size. There is mild left ventricular hypertrophy. Left ventricular diastolic parameters are consistent with Grade I diastolic dysfunction (impaired relaxation). Right Ventricle: The right ventricular size is normal. No increase in right ventricular wall thickness. Right ventricular systolic function is normal. Tricuspid regurgitation signal is inadequate for assessing PA pressure. Left Atrium: Left atrial size was mildly dilated. Right Atrium: Right atrial size was normal in size. Pericardium: There is no evidence of pericardial effusion. Mitral Valve: The mitral valve is normal in structure. Mild mitral annular calcification. Trivial mitral valve regurgitation. No evidence of mitral valve stenosis. Tricuspid Valve: The tricuspid valve is normal in structure. Tricuspid valve regurgitation is not demonstrated. Aortic Valve: The aortic valve is tricuspid. Aortic valve regurgitation is not visualized. Mild aortic valve sclerosis is present, with no evidence of aortic valve stenosis. Pulmonic Valve: The pulmonic valve was normal in structure. Pulmonic valve regurgitation is not visualized. Aorta: The aortic root is normal in size and structure. Venous: The inferior vena cava was not well visualized. IAS/Shunts: No atrial level shunt detected by color flow Doppler.  LEFT VENTRICLE PLAX 2D LVIDd:         3.90 cm LVIDs:         2.70 cm LV PW:         1.10 cm LV IVS:        1.00 cm LVOT diam:     2.30 cm LV SV:         74 LV SV Index:   40 LVOT Area:     4.15 cm  RIGHT VENTRICLE RV S prime:     12.50 cm/s TAPSE (M-mode): 1.7 cm LEFT ATRIUM             Index       RIGHT ATRIUM           Index LA diam:        5.00 cm 2.71 cm/m  RA Area:      17.70 cm LA Vol (A2C):   55.0 ml 29.85 ml/m RA Volume:   51.20 ml  27.79 ml/m LA Vol (A4C):   59.6 ml 32.35 ml/m LA Biplane Vol: 60.6 ml 32.89 ml/m  AORTIC VALVE LVOT Vmax:   97.13 cm/s LVOT Vmean:  61.600 cm/s LVOT VTI:    0.178 m  AORTA Ao Root diam: 3.70 cm Ao Asc diam:  3.00 cm  SHUNTS Systemic VTI:  0.18 m Systemic Diam: 2.30 cm Loralie Champagne MD Electronically signed by Loralie Champagne MD Signature Date/Time: 04/12/2021/3:29:37 PM    Final    CT HEAD CODE STROKE WO CONTRAST  Result Date: 04/30/2021 CLINICAL DATA:  Code stroke. EXAM: CT HEAD WITHOUT CONTRAST TECHNIQUE: Contiguous axial images were obtained from the base of the skull through the vertex without intravenous contrast. COMPARISON:  None. FINDINGS: Brain: Acute parenchymal hemorrhage is present centered in the right basal ganglia region with extension into the corona radiata superiorly and temporal lobe inferiorly. Mild surrounding edema. Regional mass effect is present with partial effacement of the right lateral ventricle. No substantial midline shift. No intraventricular extension. No hydrocephalus. Gray-white differentiation is preserved. 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 Additional patchy  low-attenuation in the supratentorial white matter probably reflects chronic microvascular ischemic changes. Vascular: There is intracranial atherosclerotic calcification at the skull base. Skull: Unremarkable. Sinuses/Orbits: Ethmoid dominant mucosal thickening. Orbits are unremarkable. Other: Mastoid air cells are clear. IMPRESSION: Acute parenchymal hemorrhage centered in the right basal ganglia region with mild edema and regional mass effect. No intraventricular extension. These results were communicated to Dr. Curly Shores at 6:11 pm on 04/28/2021 by text page via the [facility] messaging system. Electronically Signed   By: Macy Mis M.D.   On: 04/16/2021 18:15   [place] EKG SITE RITE  Result Date: 04/14/2021 If Site Rite image not  attached, placement could not be confirmed due to current cardiac rhythm.  CT ANGIO NECK CODE STROKE  Result Date: 04/12/2021 CLINICAL DATA:  80 year old male code stroke presentation on 04/29/2021 with right lentiform intra-axial hemorrhage. Subsequent encounter. EXAM: CT ANGIOGRAPHY HEAD AND NECK TECHNIQUE: Multidetector CT imaging of the head and neck was performed using the standard protocol during bolus administration of intravenous contrast. Multiplanar CT image reconstructions and MIPs were obtained to evaluate the vascular anatomy. Carotid stenosis measurements (when applicable) are obtained utilizing NASCET criteria, using the distal internal carotid diameter as the denominator. CONTRAST:  44mL OMNIPAQUE IOHEXOL 350 MG/ML SOLN COMPARISON:  Head CT 04/03/2021. FINDINGS: CT HEAD Brain: Calcified atherosclerosis at the skull base. Hyperdense intra-axial hemorrhage centered at the right lentiform encompasses 62 by 28 x 49 mm (AP by transverse by CC) for an estimated blood volume of 43 mL, and has mildly extended since presentation (approximately 31 mL at that time). But surrounding edema and regional mass effect have not significantly changed. Partially effaced right lateral ventricle with no ventriculomegaly. No intraventricular or extra-axial extension of blood. No significant midline shift. Basilar cisterns remain patent. Stable gray-white matter differentiation elsewhere. 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 Calvarium and skull base: Intact, negative. Paranasal sinuses: Continued paranasal sinus opacification maximal in the ethmoids. Tympanic cavities and mastoids remain clear. Orbits: Visualized orbits and scalp soft tissues are within normal limits. Negative noncontrast deep soft tissue spaces of the face. CTA NECK Skeleton: Right side TMJ degeneration. Widespread cervical spine degeneration. No acute or suspicious osseous lesion identified. Upper chest: Mild upper lobe lung scarring  mostly on the left. Retained secretions, bubbly opacity in the visible trachea. No superior mediastinal lymphadenopathy. Other neck: Negative.  No neck mass or lymphadenopathy. Aortic arch: Extensive soft and calcified arch atherosclerosis. Three vessel arch configuration. Great vessel origins relatively spared. Right carotid system: Mild brachiocephalic artery and right CCA origin atherosclerosis without stenosis. Mildly tortuous right CCA. Mostly calcified plaque at the right carotid bifurcation and posterior right ICA bulb without stenosis. Mildly tortuous ICA to the skull base. Left carotid system: Minimal plaque at the left CCA origin without stenosis. Soft plaque in the medial vessel at the level of the larynx on series 5, image 131 without stenosis. Minor plaque at the left ICA origin without stenosis. Mildly tortuous left ICA to the skull base. Vertebral arteries: Soft and calcified plaque in the proximal right subclavian artery without stenosis. Right vertebral artery origin remains normal. Tortuous right V1 and proximal V2 segments. Patent right vertebral artery to the skull base without significant plaque or stenosis. Soft and calcified plaque in the proximal left subclavian artery without stenosis. Mild involvement of the left vertebral artery origin without stenosis. Tortuous left V1 and proximal V2 segments. Codominant left vertebral artery is patent to the skull base without significant plaque or stenosis. CTA HEAD Posterior  circulation: Codominant distal vertebral arteries with mild calcified plaque mostly on the left. No significant stenosis. Patent PICA origins and vertebrobasilar junction. Patent basilar artery with mild irregularity but no significant stenosis. Patent SCA and right PCA origins. Fetal left PCA origin. Right posterior communicating artery diminutive or absent. Bilateral P2 segments are mildly irregular without stenosis. But there is bilateral P3 segment stenosis, mild to moderate  involving the inferior division on the left (series 10, image 31) and moderate to severe involving the right P3 trunk on series 10, image 26. Anterior circulation: Both ICA siphons are patent. Abundant siphon calcified plaque on both sides but no significant stenosis on the left. There is moderate stenosis of the right supraclinoid ICA on series 7, image 93. Normal left posterior communicating artery. Patent carotid termini. MCA and ACA origins are within normal limits. No convincing CTA spot sign associated with the right lentiform hemorrhage. Mildly dominant and tortuous right ACA A1 segment. Mild left A1 irregularity without stenosis. 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 Otherwise the bilateral ACA branches are within normal limits. Left MCA M1 segment and bifurcation are patent without stenosis. Left MCA branches are within normal limits. Right MCA M1 segment and bifurcation are patent without stenosis. Right MCA branches are within normal limits. Venous sinuses: Early contrast timing but grossly patent. Anatomic variants: Fetal left PCA origin. Mildly dominant right ACA A1. Review of the MIP images confirms the above findings IMPRESSION: 1. Right lentiform intra-axial hemorrhage has slightly enlarged since presentation, estimated blood volume now 43 mL (previously 31 mL). But there is no CTA spot sign, and mild regional edema and mass effect are stable. 2. 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 There is up to Moderate stenosis of the Right supraclinoid ICA due to calcified plaque. And there is Moderate To Severe bilateral PCA P3 segment stenosis. No significant Vertebrobasilar or cervical carotid stenosis. 3. Aortic Atherosclerosis (ICD10-I70.0). Electronically Signed   By: Genevie Ann M.D.   On: 04/12/2021 06:27      PHYSICAL EXAM  Temp:  [98.4 F (36.9 C)-100.6 F  (38.1 C)] 100.6 F (38.1 C) (07/16 0800) Pulse Rate:  [66-100] 100 (07/16 1106) Resp:  [21-32] 32 (07/16 1106) BP: (94-155)/(57-83) 150/75 (07/16 1106) SpO2:  [91 %-98 %] 94 % (07/16 1106) FiO2 (%):  [40 %-50 %] 40 % (07/16 1106)  General -elderly Caucasian male, intubated on sedation.  Ophthalmologic - fundi not visualized due to noncooperation.  Cardiovascular - Regular rate and rhythm, not in afib today.  Neuro - intubated on sedation, eyes closed,  following commands. With forced eye opening, eyes in mid position, able to look to the left only to midline.  Right gaze deviation not blinking to visual threat, doll's eyes sluggish, not tracking, PERRL. Corneal reflex weakly present bilaterally, gag and cough present. Breathing over the vent.  Facial symmetry not able to test due to ET tube.  Tongue protrusion not cooperative. On pain stimulation, able to move right side well and trace movement in the left side.  We will follow only occasional commands on the right DTR diminished and no babinski. Sensation, coordination and gait not tested.   ASSESSMENT/PLAN Richard Summers is a 80 y.o. male with history of hypertension, hyperlipidemia, MI status post CABG x3, OSA, seizure on Keppra admitted for left-sided weakness, left neglect, fall. No tPA given due to [place].  ICH:  right BG ICH likely hypertensive etiology CT head right BG ICH CTA head and neck moderate stenosis of the right supraclinoid ICA, moderate to severe bilateral P3.  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 CT repeat showed slight increase of right BG hemorrhage, no CT spot sign, mild regional edema and mass-effect MRI stable right BG hematoma, minimal midline shift CT head repeat 7/15 large stable hematoma right BG, increased mass-effect with edema and mild midline shift 2D Echo EF 45% LDL 149 HgbA1c 6.2 SCDs for VTE prophylaxis No antithrombotic prior to admission, now on No antithrombotic due to [place] Ongoing  aggressive stroke risk factor management Therapy recommendations: Pending Disposition: Pending  Cerebral edema CT head repeat showed slight increase of right BG ICH, mild regional edema and mass-effect Given the size of ICH, put on 3% saline Na goal 150-155 Na 137->141->145->151->154->161 3% saline @ 50->75->50-> off On FW MRI stable right BG hematoma, minimal midline shift  Respiratory failure ?  Aspiration Intubated overnight On vent and sedation CCM on board  New diagnosed paroxysmal A. fib EKG showed A. Fib  Telemetry A. Fib -> now sinus Rate controlled Not AC candidate  History of seizure On home Keppra XR 2000 daily Continue Keppra 1 g twice daily -> 500 bid for renal adjustment  AKI Urinary retention Creatinine 0.75-1.1-1.79-2.34-2.77-1.54 Could be post renal Foley catheter placed On FW CCM following  Hypertension & hypotension Stable on Cleviprex -> Neo  BP goal < 160 Avoid low BP CCM on board Long term BP goal normotensive  Hyperlipidemia Home meds: Lipitor 10 LDL 149, goal < 70 Hold off Lipitor for now Consider to resume statin at discharge  Dysphagia Was on dysphagia 1 with nectar thick liquid Not n.p.o. Speech on board Consider tube feeding when stable  Other Stroke Risk Factors Advanced age Coronary artery disease/MI status post CABG x3 Obstructive sleep apnea, on CPAP at home  Other Active Problems MGUS Hypokalemia, K 3.2-> supplement ->3.6   Hospital day # 4 Plan discontinue hypertonic saline protocol and continue free water replacement.  Continue strict blood pressure control with systolic below 740.  Wean off ventilatory support as per critical care team.  No family available at the bedside for discussion. This patient is critically ill due to respiratory failure, right large BG ICH, aspiration, AKI, hypotension, cerebral edema new diagnosed A. fib and at significant risk of neurological worsening, death form brain herniation,  hematoma expansion, renal failure, recurrent stroke, heart failure. This patient's care requires constant monitoring of vital signs, hemodynamics, respiratory and cardiac monitoring, review of multiple databases, neurological assessment, discussion with family, other specialists and medical decision making of high complexity. I spent 35 minutes of neurocritical care time in the care of this patient.  I discussed with CCM team I had long discussion with wife [name] over the phone, updated pt current condition, treatment plan and potential prognosis, and answered all the questions.  She expressed understanding and appreciation.    Antony Contras, MD Stroke Neurology 04/15/2021 12:08 PM    To contact Stroke Continuity provider, please refer to [email or web address] After hours, contact General Neurology

## 2021-04-15 NOTE — Progress Notes (Signed)
Reached out to [employer], CCM about pt episode of a-fib/RVR with HR in 160s. Pt continues to have a-fib with HR in 115-120s. New orders placed by NP Kennieth Rad

## 2021-04-15 NOTE — Progress Notes (Signed)
Patient Sodium levels within goal range. 3% restarted per Dr. Cheral Marker order.  Hart Rochester, RN

## 2021-04-15 NOTE — Progress Notes (Signed)
Critical Values @2030 . Stroke Notified, no actions taken at this time.  Na 160 Cl >130  Hart Rochester, RN

## 2021-04-15 NOTE — Progress Notes (Signed)
RT NOTES: Attempted PS/CPAP. Pt tachypneic with rate of 34 and increased WOB with accessory muscle use. Placed back on full support at this time.

## 2021-04-15 NOTE — Progress Notes (Signed)
eLink Physician-Brief Progress Note Patient Name: Linville Decarolis DOB: [birth year] MRN: [id number]   Date of Service  04/15/2021  HPI/Events of Note  Patient has Phenylephrine gtt infusing for blood pressure support but there is not an order for it in the chart.  eICU Interventions  Order entered for Phenylephrine gtt.        Frederik Pear 04/15/2021, 7:49 PM

## 2021-04-16 DIAGNOSIS — Z515 Encounter for palliative care: Secondary | ICD-10-CM | POA: Diagnosis not present

## 2021-04-16 DIAGNOSIS — Z978 Presence of other specified devices: Secondary | ICD-10-CM | POA: Diagnosis not present

## 2021-04-16 DIAGNOSIS — I619 Nontraumatic intracerebral hemorrhage, unspecified: Secondary | ICD-10-CM | POA: Diagnosis not present

## 2021-04-16 DIAGNOSIS — Z7189 Other specified counseling: Secondary | ICD-10-CM

## 2021-04-16 DIAGNOSIS — T17908A Unspecified foreign body in respiratory tract, part unspecified causing other injury, initial encounter: Secondary | ICD-10-CM | POA: Diagnosis not present

## 2021-04-16 LAB — BASIC METABOLIC PANEL
BUN: 36 mg/dL — ABNORMAL HIGH (ref 8–23)
CO2: 28 mmol/L (ref 22–32)
Calcium: 8 mg/dL — ABNORMAL LOW (ref 8.9–10.3)
Chloride: >130 mmol/L (ref 98–111)
Creatinine, Ser: 1.1 mg/dL (ref 0.61–1.24)
GFR, Estimated: >60 mL/min (ref >60)
Glucose, Bld: 187 mg/dL — ABNORMAL HIGH (ref 70–99)
Potassium: 3.8 mmol/L (ref 3.5–5.1)
Sodium: 160 mmol/L — ABNORMAL HIGH (ref 135–145)

## 2021-04-16 LAB — CULTURE, RESPIRATORY W GRAM STAIN
Culture: NORMAL
Gram Stain: NONE SEEN

## 2021-04-16 LAB — MAGNESIUM: Magnesium: 2.1 mg/dL (ref 1.7–2.4)

## 2021-04-16 LAB — CBC
HCT: 36.7 % — ABNORMAL LOW (ref 39.0–52.0)
Hemoglobin: 11.9 g/dL — ABNORMAL LOW (ref 13.0–17.0)
MCH: 31.7 pg (ref 26.0–34.0)
MCHC: 32.4 g/dL (ref 30.0–36.0)
MCV: 97.9 fL (ref 80.0–100.0)
Platelets: 69 10*3/uL — ABNORMAL LOW (ref 150–400)
RBC: 3.75 MIL/uL — ABNORMAL LOW (ref 4.22–5.81)
RDW: 14.5 % (ref 11.5–15.5)
WBC: 9 10*3/uL (ref 4.0–10.5)
nRBC: 0 % (ref 0.0–0.2)

## 2021-04-16 LAB — PHOSPHORUS: Phosphorus: 1.4 mg/dL — ABNORMAL LOW (ref 2.5–4.6)

## 2021-04-16 LAB — GLUCOSE, CAPILLARY
Glucose-Capillary: 168 mg/dL — ABNORMAL HIGH (ref 70–99)
Glucose-Capillary: 172 mg/dL — ABNORMAL HIGH (ref 70–99)
Glucose-Capillary: 218 mg/dL — ABNORMAL HIGH (ref 70–99)
Glucose-Capillary: 204 mg/dL — ABNORMAL HIGH (ref 70–99)
Glucose-Capillary: 251 mg/dL — ABNORMAL HIGH (ref 70–99)
Glucose-Capillary: 239 mg/dL — ABNORMAL HIGH (ref 70–99)

## 2021-04-16 LAB — PROCALCITONIN: Procalcitonin: 13.93 ng/mL

## 2021-04-16 MED ORDER — FENTANYL CITRATE (PF) 100 MCG/2ML IJ SOLN
25.0000 ug | INTRAMUSCULAR | Status: DC | PRN
Start: 2021-04-16 — End: 2021-04-19
  Administered 2021-04-16 – 2021-04-19 (×5): 50 ug via INTRAVENOUS

## 2021-04-16 MED ORDER — LEVETIRACETAM 100 MG/ML PO SOLN
500.0000 mg | Freq: Once | ORAL | Status: AC
  Administered 2021-04-16: 500 mg
  Filled 2021-04-16: qty 5

## 2021-04-16 MED ORDER — LEVETIRACETAM 100 MG/ML PO SOLN
1000.0000 mg | Freq: Two times a day (BID) | ORAL | Status: DC
  Administered 2021-04-16 – 2021-04-19 (×6): 1000 mg
  Filled 2021-04-16 (×6): qty 10

## 2021-04-16 MED ORDER — LEVOFLOXACIN IN D5W 750 MG/150ML IV SOLN
750.0000 mg | INTRAVENOUS | Status: DC
  Administered 2021-04-16 – 2021-04-17 (×2): 750 mg via INTRAVENOUS
  Filled 2021-04-16 (×3): qty 150

## 2021-04-16 MED ORDER — POTASSIUM PHOSPHATES 15 MMOLE/5ML IV SOLN
30.0000 mmol | Freq: Once | INTRAVENOUS | Status: AC
  Administered 2021-04-16: 30 mmol via INTRAVENOUS
  Filled 2021-04-16: qty 10

## 2021-04-16 MED ORDER — FENTANYL 2500MCG IN NS 250ML (10MCG/ML) PREMIX INFUSION
0.0000 ug/h | INTRAVENOUS | Status: DC
  Administered 2021-04-17: 50 ug/h via INTRAVENOUS
  Filled 2021-04-16 (×3): qty 250

## 2021-04-16 MED ORDER — FENTANYL CITRATE (PF) 100 MCG/2ML IJ SOLN
25.0000 ug | INTRAMUSCULAR | Status: DC | PRN
Start: 2021-04-16 — End: 2021-04-19

## 2021-04-16 NOTE — Progress Notes (Signed)
Pharmacy Antibiotic Note  Richard Summers is a 80 y.o. male admitted on 04/25/2021 with pneumonia.  Pharmacy has been consulted for Levaquin dosing. Penicillin and sulfa allergy noted. No history of cephalosporins.   Renal function improving and patient has continued fever.  WBC normalized however.  QTc 431ms but was in Afib.  Plan: Increase Levaquin to 750mg  IV Q24H Monitor renal fxn, micro data, clinical progress for the need to broaden coverage  Height: 5\' 6"  (167.6 cm) Weight: 79.7 kg (175 lb 11.3 oz) IBW/kg (Calculated) : 63.8  Temp (24hrs), Avg:100.2 F (37.9 C), Min:98.4 F (36.9 C), Max:101.5 F (38.6 C)  Recent Labs  Lab 04/12/21 0817 04/13/21 0238 04/13/21 2225 04/14/21 0409 04/14/21 0834 04/15/21 0404 04/15/21 1948 04/16/21 0553  WBC 8.5 10.0  --  5.0  --  10.6*  --  9.0  CREATININE 0.75 1.12   < > 2.34* 2.77* 1.54* 1.22 1.10   < > = values in this interval not displayed.     Estimated Creatinine Clearance: 54.1 mL/min (by C-G formula based on SCr of 1.1 mg/dL).    Allergies  Allergen Reactions   Penicillins Anaphylaxis, Hives and Other (See Comments)   Sulfa Antibiotics Anaphylaxis, Hives and Other (See Comments)   Ezetimibe Other (See Comments)   Rosuvastatin Other (See Comments)   Statins     Other reaction(s): Other (See Comments) Confusion and mania    Levaquin 7/15 >>   7/13 MRSA PCR - negative 7/14 TA - reincubated  Ayn Domangue D. Mina Marble, PharmD, BCPS, [place] 04/16/2021, 10:31 AM

## 2021-04-16 NOTE — Progress Notes (Signed)
STROKE TEAM PROGRESS NOTE   SUBJECTIVE (INTERVAL HISTORY) His RN is at the bedside. Pt is presently on weaning trial since this morning.  He remains on fentanyl, requiring pressor support with phenylephrine which is being weaned off.  And high Na 160, hypertonic saline drip has been discontinued  x 2 days.  Neurological exam remains unchanged patient is arousable following commands consistently on the right and remains with dense left hemiplegia and neglect.  Renal function continues to improve to 1.14 today.  He had temperature spike of 101 but WBC count is normal  OBJECTIVE Temp:  [98.4 F (36.9 C)-101.5 F (38.6 C)] 101.2 F (38.4 C) (07/17 1200) Pulse Rate:  [65-113] 65 (07/17 1400) Cardiac Rhythm: Normal sinus rhythm (07/17 0800) Resp:  [15-26] 24 (07/17 1400) BP: (86-146)/(57-89) 116/64 (07/17 1400) SpO2:  [91 %-95 %] 94 % (07/17 1400) FiO2 (%):  [40 %] 40 % (07/17 0800) Weight:  [79.7 kg] 79.7 kg (07/17 0400)  Recent Labs  Lab 04/15/21 1917 04/15/21 2312 04/16/21 0316 04/16/21 0725 04/16/21 1131  GLUCAP 175* 177* 168* 172* 218*   Recent Labs  Lab 04/14/21 0409 04/14/21 0409 04/14/21 0518 04/14/21 0834 04/14/21 1345 04/14/21 1639 04/14/21 2004 04/14/21 2338 04/15/21 0404 04/15/21 0411 04/15/21 1728 04/15/21 1948 04/16/21 0553  NA 161*  --  167* 161*   < > 153* 151* 159* 161*  --   --  160* 160*  K 3.6  --  3.6 3.6  --   --   --   --  2.9*  --   --  3.1* 3.8  CL >130*  --   --  >130*  --   --   --   --  >130*  --   --  >130* >130*  CO2 19*  --   --  16*  --   --   --   --  22  --   --  25 28  GLUCOSE 110*  --   --  145*  --   --   --   --  116*  --   --  183* 187*  BUN 33*  --   --  39*  --   --   --   --  39*  --   --  37* 36*  CREATININE 2.34*  --   --  2.77*  --   --   --   --  1.54*  --   --  1.22 1.10  CALCIUM 8.2*  --   --  8.4*  --   --   --   --  7.9*  --   --  7.9* 8.0*  MG  --    < >  --  2.1  --  1.7  --   --  1.9 1.8 2.4  --  2.1  PHOS  --    < >   --  1.9*  --  >30.0*  --   --  2.6 2.4* 1.4*  --  1.4*   < > = values in this interval not displayed.   Recent Labs  Lab 04/08/2021 1756  AST 22  ALT 20  ALKPHOS 59  BILITOT 1.8*  PROT 7.0  ALBUMIN 4.1   Recent Labs  Lab 04/01/2021 1756 04/29/2021 1804 04/12/21 0817 04/13/21 0238 04/13/21 2320 04/14/21 0409 04/14/21 0518 04/15/21 0404 04/16/21 0553  WBC 7.5  --  8.5 10.0  --  5.0  --  10.6* 9.0  NEUTROABS 6.2  --   --   --   --   --   --   --   --  HGB 16.0   < > 16.6 15.5 13.6 16.1 14.6 13.7 11.9*  HCT 47.4   < > 47.7 45.0 40.0 49.2 43.0 39.9 36.7*  MCV 93.3  --  91.2 92.0  --  98.0  --  94.5 97.9  PLT 160  --  174 173  --  146*  --  77* 69*   < > = values in this interval not displayed.   No results for input(s): CKTOTAL, CKMB, CKMBINDEX, TROPONINI in the last 168 hours. No results for input(s): LABPROT, INR in the last 72 hours.  No results for input(s): COLORURINE, LABSPEC, [place], GLUCOSEU, HGBUR, BILIRUBINUR, KETONESUR, PROTEINUR, UROBILINOGEN, NITRITE, LEUKOCYTESUR in the last 72 hours.  Invalid input(s): APPERANCEUR      Component Value Date/Time   CHOL 239 (H) 04/13/2021 0238   TRIG 62 04/14/2021 0409   HDL 72 04/13/2021 0238   CHOLHDL 3.3 04/13/2021 0238   VLDL 18 04/13/2021 0238   LDLCALC 149 (H) 04/13/2021 0238   Lab Results  Component Value Date   HGBA1C 6.2 (H) 04/13/2021      Component Value Date/Time   LABOPIA NONE DETECTED 04/12/2021 0107   COCAINSCRNUR NONE DETECTED 04/12/2021 0107   LABBENZ NONE DETECTED 04/12/2021 0107   AMPHETMU NONE DETECTED 04/12/2021 0107   THCU NONE DETECTED 04/12/2021 0107   LABBARB NONE DETECTED 04/12/2021 0107    No results for input(s): ETH in the last 168 hours.  I have personally reviewed the radiological images below and agree with the radiology interpretations.  CT ANGIO HEAD W OR WO CONTRAST  Result Date: 04/12/2021 CLINICAL DATA:  80 year old male code stroke presentation on 04/28/2021 with right  lentiform intra-axial hemorrhage. Subsequent encounter. EXAM: CT ANGIOGRAPHY HEAD AND NECK TECHNIQUE: Multidetector CT imaging of the head and neck was performed using the standard protocol during bolus administration of intravenous contrast. Multiplanar CT image reconstructions and MIPs were obtained to evaluate the vascular anatomy. Carotid stenosis measurements (when applicable) are obtained utilizing NASCET criteria, using the distal internal carotid diameter as the denominator. CONTRAST:  87mL OMNIPAQUE IOHEXOL 350 MG/ML SOLN COMPARISON:  Head CT 04/22/2021. FINDINGS: CT HEAD Brain: Calcified atherosclerosis at the skull base. Hyperdense intra-axial hemorrhage centered at the right lentiform encompasses 62 by 28 x 49 mm (AP by transverse by CC) for an estimated blood volume of 43 mL, and has mildly extended since presentation (approximately 31 mL at that time). But surrounding edema and regional mass effect have not significantly changed. Partially effaced right lateral ventricle with no ventriculomegaly. No intraventricular or extra-axial extension of blood. No significant midline shift. Basilar cisterns remain patent. Stable gray-white matter differentiation elsewhere. 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 Calvarium and skull base: Intact, negative. Paranasal sinuses: Continued paranasal sinus opacification maximal in the ethmoids. Tympanic cavities and mastoids remain clear. Orbits: Visualized orbits and scalp soft tissues are within normal limits. Negative noncontrast deep soft tissue spaces of the face. CTA NECK Skeleton: Right side TMJ degeneration. Widespread cervical spine degeneration. No acute or suspicious osseous lesion identified. Upper chest: Mild upper lobe lung scarring mostly on the left. Retained secretions, bubbly opacity in the visible trachea. No superior mediastinal lymphadenopathy. Other neck: Negative.  No neck mass or lymphadenopathy. Aortic arch: Extensive soft and  calcified arch atherosclerosis. Three vessel arch configuration. Great vessel origins relatively spared. Right carotid system: Mild brachiocephalic artery and right CCA origin atherosclerosis without stenosis. Mildly tortuous right CCA. Mostly calcified plaque at the right carotid bifurcation and posterior right ICA bulb  without stenosis. Mildly tortuous ICA to the skull base. Left carotid system: Minimal plaque at the left CCA origin without stenosis. Soft plaque in the medial vessel at the level of the larynx on series 5, image 131 without stenosis. Minor plaque at the left ICA origin without stenosis. Mildly tortuous left ICA to the skull base. Vertebral arteries: Soft and calcified plaque in the proximal right subclavian artery without stenosis. Right vertebral artery origin remains normal. Tortuous right V1 and proximal V2 segments. Patent right vertebral artery to the skull base without significant plaque or stenosis. Soft and calcified plaque in the proximal left subclavian artery without stenosis. Mild involvement of the left vertebral artery origin without stenosis. Tortuous left V1 and proximal V2 segments. Codominant left vertebral artery is patent to the skull base without significant plaque or stenosis. CTA HEAD Posterior circulation: Codominant distal vertebral arteries with mild calcified plaque mostly on the left. No significant stenosis. Patent PICA origins and vertebrobasilar junction. Patent basilar artery with mild irregularity but no significant stenosis. Patent SCA and right PCA origins. Fetal left PCA origin. Right posterior communicating artery diminutive or absent. Bilateral P2 segments are mildly irregular without stenosis. But there is bilateral P3 segment stenosis, mild to moderate involving the inferior division on the left (series 10, image 31) and moderate to severe involving the right P3 trunk on series 10, image 26. Anterior circulation: Both ICA siphons are patent. Abundant siphon  calcified plaque on both sides but no significant stenosis on the left. There is moderate stenosis of the right supraclinoid ICA on series 7, image 93. Normal left posterior communicating artery. Patent carotid termini. MCA and ACA origins are within normal limits. No convincing CTA spot sign associated with the right lentiform hemorrhage. Mildly dominant and tortuous right ACA A1 segment. Mild left A1 irregularity without stenosis. 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 Otherwise the bilateral ACA branches are within normal limits. Left MCA M1 segment and bifurcation are patent without stenosis. Left MCA branches are within normal limits. Right MCA M1 segment and bifurcation are patent without stenosis. Right MCA branches are within normal limits. Venous sinuses: Early contrast timing but grossly patent. Anatomic variants: Fetal left PCA origin. Mildly dominant right ACA A1. Review of the MIP images confirms the above findings IMPRESSION: 1. Right lentiform intra-axial hemorrhage has slightly enlarged since presentation, estimated blood volume now 43 mL (previously 31 mL). But there is no CTA spot sign, and mild regional edema and mass effect are stable. 2. 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 There is up to Moderate stenosis of the Right supraclinoid ICA due to calcified plaque. And there is Moderate To Severe bilateral PCA P3 segment stenosis. No significant Vertebrobasilar or cervical carotid stenosis. 3. Aortic Atherosclerosis (ICD10-I70.0). Electronically Signed   By: Genevie Ann M.D.   On: 04/12/2021 06:27   CT HEAD WO CONTRAST  Result Date: 04/14/2021 CLINICAL DATA:  Intracranial hemorrhage follow-up EXAM: CT HEAD WITHOUT CONTRAST TECHNIQUE: Contiguous axial images were obtained from the base of the skull through the vertex without intravenous contrast. COMPARISON:   CT head 04/27/2021.  MRI head 04/13/2021 FINDINGS: Brain: High-density acute hemorrhage in the right lateral basal ganglia measures approximately 5.7 x 2.5 x 3.6 cm. Hematoma size is stable. There is surrounding low-density edema. There is progressive mass-effect on the right lateral ventricle. Mild midline shift to the left slightly increased. Chronic  lacunar infarct in the head of caudate on the left and in the left subinsular white matter, unchanged. No acute ischemic infarct or mass identified. Vascular: Negative for hyperdense vessel Skull: Negative Sinuses/Orbits: Mucosal  edema paranasal sinuses.  Negative orbit Other: None IMPRESSION: Large hematoma right external capsule unchanged in size. There is increased mass-effect and edema with mild midline shift to the left. Electronically Signed   By: Franchot Gallo M.D.   On: 04/14/2021 14:52   MR BRAIN WO CONTRAST  Result Date: 04/13/2021 CLINICAL DATA:  80 year old male code stroke presentation on 04/03/2021 with right lentiform intra-axial hemorrhage. EXAM: MRI HEAD WITHOUT CONTRAST TECHNIQUE: Multiplanar, multiecho pulse sequences of the brain and surrounding structures were obtained without intravenous contrast. COMPARISON:  CTA head and neck 04/12/2021 and earlier. FINDINGS: Brain: Mix signal intra-axial hemorrhage centered at the right lentiform encompasses 68 by 26 by 45 mm (AP by transverse by CC) for an estimated blood volume a 40 mL, stable since 04/12/2021. Surrounding edema. Susceptibility artifact along the margins of the hematoma on DWI. No larger area of restricted diffusion surrounding the blood. No restricted diffusion elsewhere. Stable mass effect on the right lateral ventricle. No intraventricular extension. No extra-axial extension. Mild leftward midline shift of 3-4 mm is stable. No ventriculomegaly. Basilar cisterns remain patent. No chronic cerebral blood products elsewhere on T2* imaging. There is evidence of chronic small vessel  disease in the left corona radiata, caudate, and lentiform (series 7, image 17) with cystic encephalomalacia and patchy FLAIR hyperintensity. No cortical encephalomalacia. Brainstem and cerebellum appear negative. Cervicomedullary junction and pituitary are within normal limits. Vascular: Major intracranial vascular flow voids are preserved. Skull and upper cervical spine: Negative visible cervical spine. Visualized bone marrow signal is within normal limits. Sinuses/Orbits: Negative orbits. Moderate ethmoid and sphenoid sinus mucosal thickening. Mild maxillary sinus involvement. Other: Mastoids are clear. Visible internal auditory structures appear normal. Negative visible scalp and face. IMPRESSION: 1. Right lentiform intra-axial hemorrhage with estimated blood volume 40 mL is stable since yesterday. Susceptibility along the margins on DWI with no larger area of infarction. Stable intracranial mass effect with mild leftward midline shift. 2. No underlying chronic cerebral blood products, but there are contralateral chronic lacunar infarcts in the left basal ganglia. Electronically Signed   By: Genevie Ann M.D.   On: 04/13/2021 11:50   DG CHEST PORT 1 VIEW  Result Date: 04/15/2021 CLINICAL DATA:  Ventilator dependent.  Evaluate endotracheal tube. EXAM: PORTABLE CHEST 1 VIEW COMPARISON:  04/13/2021 FINDINGS: Endotracheal tube is in place with tip 3.8 centimeters above the carina. Nasogastric tube is in place with tip beyond the edge of the image, at least to the level of the proximal stomach. RIGHT-sided PICC line tip overlies the level of the superior vena cava. Heart size is accentuated by portable technique. There has been interval development of patchy infiltrates in the LOWER lobes bilaterally, consistent with infectious infiltrates. No evidence for pulmonary edema. IMPRESSION: Interval development of bilateral LOWER lobe infiltrates. Electronically Signed   By: Nolon Nations M.D.   On: 04/15/2021 13:26    DG CHEST PORT 1 VIEW  Result Date: 04/13/2021 CLINICAL DATA:  Check endotracheal tube placement EXAM: PORTABLE CHEST 1 VIEW COMPARISON:  04/12/2021 FINDINGS: Endotracheal tube is noted in place 16 mm above the carina. Gastric catheter is noted coiled within the stomach. Cardiac shadow is stable. The lungs are well aerated bilaterally. Mild central vascular congestion is seen without edema. IMPRESSION: Endotracheal tube in satisfactory position. Gastric catheter within the  stomach. Mild vascular congestion without interstitial edema. Electronically Signed   By: Inez Catalina M.D.   On: 04/13/2021 21:55   DG CHEST PORT 1 VIEW  Result Date: 04/12/2021 CLINICAL DATA:  80 year old male with intracranial hemorrhage. Hypoxia. EXAM: PORTABLE CHEST 1 VIEW COMPARISON:  Chest radiographs 08/28/2006. CTA neck today reported separately. FINDINGS: Portable AP semi upright view at 0415 hours. Mildly lower lung volumes compared to 2007. Normal cardiac size and mediastinal contours. Visualized tracheal air column is within normal limits. Allowing for portable technique the lungs are clear. No pneumothorax or pleural effusion. Chronic left anterior rib cerclage wires are stable. Paucity of bowel gas in the upper abdomen. IMPRESSION: No acute cardiopulmonary abnormality. Electronically Signed   By: Genevie Ann M.D.   On: 04/12/2021 06:29   DG Swallowing Func-Speech Pathology  Result Date: 04/12/2021 Formatting of this result is different from the original. Objective Swallowing Evaluation: Type of Study: MBS-Modified Barium Swallow Study  Patient Details Name: Richard Summers MRN: [id number] Date of Birth: Jun 25, 1941 Today's Date: 04/12/2021 Time: SLP Start Time (ACUTE ONLY): 75 -SLP Stop Time (ACUTE ONLY): 1017 SLP Time Calculation (min) (ACUTE ONLY): 16 min Past Medical History: Past Medical History: Diagnosis Date  Allergic rhinitis   Allergy   Barrett's esophagus   Cataract cortical, senile   Coronary artery disease  involving coronary bypass graft of native heart   Epilepsy (HCC)   GERD (gastroesophageal reflux disease)   Glaucoma   Hyperlipidemia   Hypertension   Insomnia   MGUS (monoclonal gammopathy of unknown significance)   Myocardial infarction ([place])   Nephrolithiasis   Osteoporosis   S/P triple vessel bypass   Sleep apnea   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 Plates and screws installed  TONSILLECTOMY    triple bypass    VASECTOMY   HPI: 80 yr old man  with history of epilepsy, Barrett's esophagus, GERD, HTN, MI s/p bypass, and OSA who had a fall in the shower with sudden left sided  hemiplegia with a fixed rt gaze. CT revealed right Basal Ganglia hemorrhage. CT repeated am 7/13 with right lentiform intra-axial hemorrhage has slightly enlarged since presentation.  No data recorded Assessment / Plan / Recommendation CHL IP CLINICAL IMPRESSIONS 04/12/2021 Clinical Impression Oral phase of swallow marked by decreased cohesion, ROM and manipulation resulted in anterior spill, left lateral sulci residue, lingual residue, holding and transit delays. Lingual residue spilled to valleculae and cleared with cues and spontaneous swallows. Pharyngeal and laryngeal mobility and strength were adequate once swallow was initiated. Poor timing to close larynx occured with thin liquid barium resulting in incomplete laryngeal closure and aspiration with reflexive cough (PAS 7) unable to clear trachea. Pt was very restless, pushing to his left, distracted and therapist unable to attempt compensatory strategies during study. Recommend Dys 1 (puree), nectar thick liquids, crush pills, check left buccal cavity for pocketing, limit distractions, straws allowed and full supervision and assist. SLP Visit Diagnosis Dysphagia,  oropharyngeal phase (R13.12) Attention and concentration deficit following -- Frontal lobe and executive function deficit following -- Impact on safety and function Moderate aspiration risk;Mild aspiration risk   CHL IP TREATMENT RECOMMENDATION 04/12/2021 Treatment Recommendations Therapy as outlined in treatment plan below   Prognosis 04/12/2021 Prognosis  for Safe Diet Advancement Good Barriers to Reach Goals Cognitive deficits Barriers/Prognosis Comment -- CHL IP DIET RECOMMENDATION 04/12/2021 SLP Diet Recommendations Dysphagia 1 (Puree) solids;Nectar thick liquid Liquid Administration via Straw;Cup Medication Administration Crushed with puree Compensations Minimize environmental distractions;Slow rate;Small sips/bites;Lingual sweep for clearance of pocketing;Monitor for anterior loss Postural Changes Seated upright at 90 degrees   CHL IP OTHER RECOMMENDATIONS 04/12/2021 Recommended Consults -- Oral Care Recommendations Oral care BID Other Recommendations --   CHL IP FOLLOW UP RECOMMENDATIONS 04/12/2021 Follow up Recommendations Inpatient Rehab   CHL IP FREQUENCY AND DURATION 04/12/2021 Speech Therapy Frequency (ACUTE ONLY) min 2x/week Treatment Duration 2 weeks      CHL IP ORAL PHASE 04/12/2021 Oral Phase Impaired Oral - Pudding Teaspoon -- Oral - Pudding Cup -- Oral - Honey Teaspoon -- Oral - Honey Cup -- Oral - Nectar Teaspoon Left anterior bolus loss;Reduced posterior propulsion;Decreased bolus cohesion;Left pocketing in lateral sulci;Lingual/palatal residue Oral - Nectar Cup Left anterior bolus loss;Reduced posterior propulsion;Decreased bolus cohesion;Left pocketing in lateral sulci;Lingual/palatal residue Oral - Nectar Straw Reduced posterior propulsion;Delayed oral transit;Lingual/palatal residue Oral - Thin Teaspoon Left anterior bolus loss;Reduced posterior propulsion;Decreased bolus cohesion;Holding of bolus Oral - Thin Cup Left anterior bolus loss;Reduced posterior propulsion;Decreased bolus cohesion;Holding  of bolus Oral - Thin Straw -- Oral - Puree Holding of bolus;Lingual/palatal residue;Delayed oral transit Oral - Mech Soft -- Oral - Regular Left pocketing in lateral sulci;Delayed oral transit Oral - Multi-Consistency -- Oral - Pill -- Oral Phase - Comment --  CHL IP PHARYNGEAL PHASE 04/12/2021 Pharyngeal Phase Impaired Pharyngeal- Pudding Teaspoon -- Pharyngeal -- Pharyngeal- Pudding Cup -- Pharyngeal -- Pharyngeal- Honey Teaspoon -- Pharyngeal -- Pharyngeal- Honey Cup -- Pharyngeal -- Pharyngeal- Nectar Teaspoon WFL Pharyngeal -- Pharyngeal- Nectar Cup Pharyngeal residue - valleculae Pharyngeal -- Pharyngeal- Nectar Straw WFL Pharyngeal -- Pharyngeal- Thin Teaspoon WFL Pharyngeal -- Pharyngeal- Thin Cup Penetration/Aspiration during swallow Pharyngeal Material enters airway, passes BELOW cords and not ejected out despite cough attempt by patient Pharyngeal- Thin Straw -- Pharyngeal -- Pharyngeal- Puree -- Pharyngeal -- Pharyngeal- Mechanical Soft -- Pharyngeal -- Pharyngeal- Regular -- Pharyngeal -- Pharyngeal- Multi-consistency -- Pharyngeal -- Pharyngeal- Pill -- Pharyngeal -- Pharyngeal Comment --  No flowsheet data found. [place] Siren 04/12/2021, 2:31 PM              ECHOCARDIOGRAM COMPLETE  Result Date: 04/12/2021    ECHOCARDIOGRAM REPORT   Patient Name:   Richard Summers Date of Exam: 04/12/2021 Medical Rec #:  [id number]      Height:       66.0 in Accession #:    [id number]     Weight:       164.9 lb Date of Birth:  Feb 10, 1941      BSA:          1.842 m Patient Age:    58 years       BP:           146/75 mmHg Patient Gender: M              HR:           80 bpm. Exam Location:  Inpatient Procedure: 2D Echo, Cardiac Doppler and Color Doppler Indications:    Stroke I63.9  History:        Patient has no prior history of Echocardiogram examinations. CAD                 and Previous Myocardial Infarction; Risk Factors:Dyslipidemia  and Hypertension.  Sonographer:    Bernadene Person RDCS  Referring Phys: [id number] [place]  1. Left ventricular ejection fraction, by estimation, is 45%. The left ventricle demonstrates regional wall motion abnormalities with basal inferior akinesis, basal to mid inferolateral akinesis, basal anterolateral hypokinesis. There is mild left ventricular hypertrophy. Left ventricular diastolic parameters are consistent with Grade I diastolic dysfunction (impaired relaxation).  2. Right ventricular systolic function is normal. The right ventricular size is normal. Tricuspid regurgitation signal is inadequate for assessing PA pressure.  3. Left atrial size was mildly dilated.  4. The mitral valve is normal in structure. Trivial mitral valve regurgitation. No evidence of mitral stenosis.  5. The aortic valve is tricuspid. Aortic valve regurgitation is not visualized. Mild aortic valve sclerosis is present, with no evidence of aortic valve stenosis. FINDINGS  Left Ventricle: Left ventricular ejection fraction, by estimation, is 45%. The left ventricle has mildly decreased function. The left ventricle demonstrates regional wall motion abnormalities. The left ventricular internal cavity size was normal in size. There is mild left ventricular hypertrophy. Left ventricular diastolic parameters are consistent with Grade I diastolic dysfunction (impaired relaxation). Right Ventricle: The right ventricular size is normal. No increase in right ventricular wall thickness. Right ventricular systolic function is normal. Tricuspid regurgitation signal is inadequate for assessing PA pressure. Left Atrium: Left atrial size was mildly dilated. Right Atrium: Right atrial size was normal in size. Pericardium: There is no evidence of pericardial effusion. Mitral Valve: The mitral valve is normal in structure. Mild mitral annular calcification. Trivial mitral valve regurgitation. No evidence of mitral valve stenosis. Tricuspid Valve: The tricuspid valve is normal in structure.  Tricuspid valve regurgitation is not demonstrated. Aortic Valve: The aortic valve is tricuspid. Aortic valve regurgitation is not visualized. Mild aortic valve sclerosis is present, with no evidence of aortic valve stenosis. Pulmonic Valve: The pulmonic valve was normal in structure. Pulmonic valve regurgitation is not visualized. Aorta: The aortic root is normal in size and structure. Venous: The inferior vena cava was not well visualized. IAS/Shunts: No atrial level shunt detected by color flow Doppler.  LEFT VENTRICLE PLAX 2D LVIDd:         3.90 cm LVIDs:         2.70 cm LV PW:         1.10 cm LV IVS:        1.00 cm LVOT diam:     2.30 cm LV SV:         74 LV SV Index:   40 LVOT Area:     4.15 cm  RIGHT VENTRICLE RV S prime:     12.50 cm/s TAPSE (M-mode): 1.7 cm LEFT ATRIUM             Index       RIGHT ATRIUM           Index LA diam:        5.00 cm 2.71 cm/m  RA Area:     17.70 cm LA Vol (A2C):   55.0 ml 29.85 ml/m RA Volume:   51.20 ml  27.79 ml/m LA Vol (A4C):   59.6 ml 32.35 ml/m LA Biplane Vol: 60.6 ml 32.89 ml/m  AORTIC VALVE LVOT Vmax:   97.13 cm/s LVOT Vmean:  61.600 cm/s LVOT VTI:    0.178 m  AORTA Ao Root diam: 3.70 cm Ao Asc diam:  3.00 cm  SHUNTS Systemic VTI:  0.18 m Systemic Diam: 2.30 cm Loralie Champagne MD Electronically  signed by Loralie Champagne MD Signature Date/Time: 04/12/2021/3:29:37 PM    Final    CT HEAD CODE STROKE WO CONTRAST  Result Date: 04/24/2021 CLINICAL DATA:  Code stroke. EXAM: CT HEAD WITHOUT CONTRAST TECHNIQUE: Contiguous axial images were obtained from the base of the skull through the vertex without intravenous contrast. COMPARISON:  None. FINDINGS: Brain: Acute parenchymal hemorrhage is present centered in the right basal ganglia region with extension into the corona radiata superiorly and temporal lobe inferiorly. Mild surrounding edema. Regional mass effect is present with partial effacement of the right lateral ventricle. No substantial midline shift. No  intraventricular extension. No hydrocephalus. Gray-white differentiation is preserved. 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 Additional patchy low-attenuation in the supratentorial white matter probably reflects chronic microvascular ischemic changes. Vascular: There is intracranial atherosclerotic calcification at the skull base. Skull: Unremarkable. Sinuses/Orbits: Ethmoid dominant mucosal thickening. Orbits are unremarkable. Other: Mastoid air cells are clear. IMPRESSION: Acute parenchymal hemorrhage centered in the right basal ganglia region with mild edema and regional mass effect. No intraventricular extension. These results were communicated to Dr. Curly Shores at 6:11 pm on 04/22/2021 by text page via the [facility] messaging system. Electronically Signed   By: Macy Mis M.D.   On: 04/24/2021 18:15   [place] EKG SITE RITE  Result Date: 04/14/2021 If Site Rite image not attached, placement could not be confirmed due to current cardiac rhythm.  CT ANGIO NECK CODE STROKE  Result Date: 04/12/2021 CLINICAL DATA:  80 year old male code stroke presentation on 04/14/2021 with right lentiform intra-axial hemorrhage. Subsequent encounter. EXAM: CT ANGIOGRAPHY HEAD AND NECK TECHNIQUE: Multidetector CT imaging of the head and neck was performed using the standard protocol during bolus administration of intravenous contrast. Multiplanar CT image reconstructions and MIPs were obtained to evaluate the vascular anatomy. Carotid stenosis measurements (when applicable) are obtained utilizing NASCET criteria, using the distal internal carotid diameter as the denominator. CONTRAST:  83mL OMNIPAQUE IOHEXOL 350 MG/ML SOLN COMPARISON:  Head CT 04/14/2021. FINDINGS: CT HEAD Brain: Calcified atherosclerosis at the skull base. Hyperdense intra-axial hemorrhage centered at the right lentiform encompasses 62 by 28 x 49 mm (AP by transverse by CC) for an estimated blood volume of 43 mL, and has mildly extended since  presentation (approximately 31 mL at that time). But surrounding edema and regional mass effect have not significantly changed. Partially effaced right lateral ventricle with no ventriculomegaly. No intraventricular or extra-axial extension of blood. No significant midline shift. Basilar cisterns remain patent. Stable gray-white matter differentiation elsewhere. 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 Calvarium and skull base: Intact, negative. Paranasal sinuses: Continued paranasal sinus opacification maximal in the ethmoids. Tympanic cavities and mastoids remain clear. Orbits: Visualized orbits and scalp soft tissues are within normal limits. Negative noncontrast deep soft tissue spaces of the face. CTA NECK Skeleton: Right side TMJ degeneration. Widespread cervical spine degeneration. No acute or suspicious osseous lesion identified. Upper chest: Mild upper lobe lung scarring mostly on the left. Retained secretions, bubbly opacity in the visible trachea. No superior mediastinal lymphadenopathy. Other neck: Negative.  No neck mass or lymphadenopathy. Aortic arch: Extensive soft and calcified arch atherosclerosis. Three vessel arch configuration. Great vessel origins relatively spared. Right carotid system: Mild brachiocephalic artery and right CCA origin atherosclerosis without stenosis. Mildly tortuous right CCA. Mostly calcified plaque at the right carotid bifurcation and posterior right ICA bulb without stenosis. Mildly tortuous ICA to the skull base. Left carotid system: Minimal plaque at the left CCA origin without stenosis. Soft  plaque in the medial vessel at the level of the larynx on series 5, image 131 without stenosis. Minor plaque at the left ICA origin without stenosis. Mildly tortuous left ICA to the skull base. Vertebral arteries: Soft and calcified plaque in the proximal right subclavian artery without stenosis. Right vertebral artery origin remains normal. Tortuous right V1 and proximal  V2 segments. Patent right vertebral artery to the skull base without significant plaque or stenosis. Soft and calcified plaque in the proximal left subclavian artery without stenosis. Mild involvement of the left vertebral artery origin without stenosis. Tortuous left V1 and proximal V2 segments. Codominant left vertebral artery is patent to the skull base without significant plaque or stenosis. CTA HEAD Posterior circulation: Codominant distal vertebral arteries with mild calcified plaque mostly on the left. No significant stenosis. Patent PICA origins and vertebrobasilar junction. Patent basilar artery with mild irregularity but no significant stenosis. Patent SCA and right PCA origins. Fetal left PCA origin. Right posterior communicating artery diminutive or absent. Bilateral P2 segments are mildly irregular without stenosis. But there is bilateral P3 segment stenosis, mild to moderate involving the inferior division on the left (series 10, image 31) and moderate to severe involving the right P3 trunk on series 10, image 26. Anterior circulation: Both ICA siphons are patent. Abundant siphon calcified plaque on both sides but no significant stenosis on the left. There is moderate stenosis of the right supraclinoid ICA on series 7, image 93. Normal left posterior communicating artery. Patent carotid termini. MCA and ACA origins are within normal limits. No convincing CTA spot sign associated with the right lentiform hemorrhage. Mildly dominant and tortuous right ACA A1 segment. Mild left A1 irregularity without stenosis. 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 Otherwise the bilateral ACA branches are within normal limits. Left MCA M1 segment and bifurcation are patent without stenosis. Left MCA branches are within normal limits. Right MCA M1 segment and bifurcation are patent without stenosis. Right MCA branches are within normal  limits. Venous sinuses: Early contrast timing but grossly patent. Anatomic variants: Fetal left PCA origin. Mildly dominant right ACA A1. Review of the MIP images confirms the above findings IMPRESSION: 1. Right lentiform intra-axial hemorrhage has slightly enlarged since presentation, estimated blood volume now 43 mL (previously 31 mL). But there is no CTA spot sign, and mild regional edema and mass effect are stable. 2. 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 There is up to Moderate stenosis of the Right supraclinoid ICA due to calcified plaque. And there is Moderate To Severe bilateral PCA P3 segment stenosis. No significant Vertebrobasilar or cervical carotid stenosis. 3. Aortic Atherosclerosis (ICD10-I70.0). Electronically Signed   By: Genevie Ann M.D.   On: 04/12/2021 06:27      PHYSICAL EXAM  Temp:  [98.4 F (36.9 C)-101.5 F (38.6 C)] 101.2 F (38.4 C) (07/17 1200) Pulse Rate:  [65-113] 65 (07/17 1400) Resp:  [15-26] 24 (07/17 1400) BP: (86-146)/(57-89) 116/64 (07/17 1400) SpO2:  [91 %-95 %] 94 % (07/17 1400) FiO2 (%):  [40 %] 40 % (07/17 0800) Weight:  [79.7 kg] 79.7 kg (07/17 0400)  General -elderly Caucasian male, intubated on sedation.  Ophthalmologic - fundi not visualized due to noncooperation.  Cardiovascular - Regular rate and rhythm, not in afib today.  Neuro - intubated on sedation, eyes closed,  following commands. With forced eye opening, eyes in mid position, able to  look to the left only to midline.  Right gaze deviation not blinking to visual threat, doll's eyes sluggish, not tracking, PERRL. Corneal reflex weakly present bilaterally, gag and cough present. Breathing over the vent.  Facial symmetry not able to test due to ET tube.  Tongue protrusion not cooperative. On pain stimulation, able to move right side well and trace movement in the left side.  We will follow only occasional commands on the right DTR diminished  and no babinski. Sensation, coordination and gait not tested.   ASSESSMENT/PLAN Mr. Dredyn Gubbels is a 80 y.o. male with history of hypertension, hyperlipidemia, MI status post CABG x3, OSA, seizure on Keppra admitted for left-sided weakness, left neglect, fall. No tPA given due to [place].    ICH:  right BG ICH likely hypertensive etiology CT head right BG ICH CTA head and neck moderate stenosis of the right supraclinoid ICA, moderate to severe bilateral P3.  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 CT repeat showed slight increase of right BG hemorrhage, no CT spot sign, mild regional edema and mass-effect MRI stable right BG hematoma, minimal midline shift CT head repeat 7/15 large stable hematoma right BG, increased mass-effect with edema and mild midline shift 2D Echo EF 45% LDL 149 HgbA1c 6.2 SCDs for VTE prophylaxis No antithrombotic prior to admission, now on No antithrombotic due to [place] Ongoing aggressive stroke risk factor management Therapy recommendations: Pending Disposition: Pending  Cerebral edema CT head repeat showed slight increase of right BG ICH, mild regional edema and mass-effect Given the size of ICH, put on 3% saline Na goal 150-155 Na 137->141->145->151->154->161 3% saline @ 50->75->50-> off On FW MRI stable right BG hematoma, minimal midline shift  Respiratory failure ?  Aspiration Intubated overnight On vent and sedation CCM on board  New diagnosed paroxysmal A. fib EKG showed A. Fib  Telemetry A. Fib -> now sinus Rate controlled Not AC candidate  History of seizure On home Keppra XR 2000 daily Continue Keppra 1 g twice daily -> 500 bid for renal adjustment  AKI Urinary retention Creatinine 0.75-1.1-1.79-2.34-2.77-1.54 Could be post renal Foley catheter placed On FW CCM following  Hypertension & hypotension Stable on Cleviprex -> Neo  BP goal < 160 Avoid low BP CCM on board Long term BP goal normotensive  Hyperlipidemia Home meds:  Lipitor 10 LDL 149, goal < 70 Hold off Lipitor for now Consider to resume statin at discharge  Dysphagia Was on dysphagia 1 with nectar thick liquid Not n.p.o. Speech on board Consider tube feeding when stable  Other Stroke Risk Factors Advanced age Coronary artery disease/MI status post CABG x3 Obstructive sleep apnea, on CPAP at home  Other Active Problems MGUS Hypokalemia, K 3.2-> supplement ->3.6   Hospital day # 5 Plan continue weaning off ventilatory support as per critical care team but I doubt patient's long-term inability to protect his airway.  Getting palliative care consult to determine goals of care would be appropriate prior to extubation..  Continue strict blood pressure control with systolic below 948.  Wean off ventilatory support as per critical care team.  No family available at the bedside for discussion.  Discussed with Jennelle Human critical care team nurse practitioner This patient is critically ill due to respiratory failure, right large BG ICH, aspiration, AKI, hypotension, cerebral edema new diagnosed A. fib and at significant risk of neurological worsening, death form brain herniation, hematoma expansion, renal failure, recurrent stroke, heart failure. This patient's care requires constant monitoring of vital  signs, hemodynamics, respiratory and cardiac monitoring, review of multiple databases, neurological assessment, discussion with family, other specialists and medical decision making of high complexity. I spent 32 minutes of neurocritical care time in the care of this patient.  I discussed with CCM team I had long discussion with wife [name] over the phone, updated pt current condition, treatment plan and potential prognosis, and answered all the questions.  She expressed understanding and appreciation.    Antony Contras, MD Stroke Neurology 04/16/2021 2:53 PM    To contact Stroke Continuity provider, please refer to [email or web address] After hours, contact General  Neurology

## 2021-04-16 NOTE — Progress Notes (Signed)
  Spoke with wife, [name] on the phone.  She is currently inpatient at [facility] with ?[facility] hopeful to be discharged to rehab either tomorrow or Tuesday.   I updated her about her husbands condition and answered her questions.  She knew he was sick, but surprised about him being on the ventilator.  [name] states they both have DNR forms.  I expressed my concerns of ongoing aspiration and unable to protect his airway if we were to extubated him secondary to his large stroke, residual deficits, left neglect, and overall weakness.    After long discussion, wife confirmed that we should not perform CPR if he were to decompensate.  We will continue supportive care for now, including vasopressors if needed in hopes that we may somehow facilitate [name] being able to visit prior to a one-way extubation.  She does not want a trach or re-intubation if he fails once extubated.   She did tell me they have a POA, unable to specifically say if its a HPOA, but her name is Skip Mayer [phone].  I will discuss with PMT to see if hopefully we can arrange getting [name] here to see him.   Dr. Leonie Man updated.  Code status changed to reflect discussion.      Kennieth Rad, ACNP [facility] 04/16/2021, 3:11 PM  See Amion for pager If no response to pager, please call PCCM consult pager After 7:00 pm call Elink

## 2021-04-16 NOTE — Progress Notes (Signed)
NAME:  Richard [facility], MRN:  [id number], DOB:  21-Jan-1941, LOS: 5 ADMISSION DATE:  04/28/2021, CONSULTATION DATE:  7/14 REFERRING MD:  Dr. Cheral Marker, CHIEF COMPLAINT:  ICH   History of Present Illness:  Patient is encephalopathic and/or intubated. Therefore history has been obtained from chart review.   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  According to reports the patient's wife heard him fall while taking a shower and called EMS.  Upon their arrival he was unable to move his left side.  He was transported to [facility] emergency department as a code stroke and CT scan upon arrival demonstrated intercranial hemorrhage.  Cerebral edema also demonstrated on scan.  He was admitted to the neuro ICU for close monitoring and Cleviprex infusion for blood pressure goal less than 140 mmHg. He was started on hypertonic saline for worsening cerebral edema. 4/14 there was some concern for his airway. He was coughing after drinking nectar thick liquid and having upper airway secretions despite suctioning. When he became hypoxic requiring increased oxygen, PCCM was consulted.   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   Significant Hospital Events: Including procedures, antibiotic start and stop dates in addition to other pertinent events   7/12 admitted with [place] 7/14 intubated for airway  protection given worsening hypoxia and witnessed aspiration; started on levaquin   7/15 tmax 101, HTS off, urinary retention/ sCr up -foley replaced, off cleviprex but hypotensive requiring vasopressors, repeated CTH, R PICC placed 7/16 no SBT given tachypnea at baseline, off HTS, neuro stable, tmax 100.6, improving sCr/ great UOP, off Neo, started on HTN meds  Interim History / Subjective:   Back on neosynephrine, low dose Weaning well on PSV 5/5 Stable neuro exam   Objective   Blood pressure 122/65, pulse 80, temperature 100 F (37.8 C), temperature source Axillary, resp. rate 15, height 5\' 6"  (1.676 m), weight 79.7 kg, SpO2 94 %.    Vent Mode: CPAP;PSV FiO2 (%):  [40 %] 40 % Set Rate:  [22 bmp] 22 bmp Vt Set:  [510 mL] 510 mL PEEP:  [5 cmH20] 5 cmH20 Pressure Support:  [5 cmH20] 5 cmH20 Plateau Pressure:  [13 cmH20] 13 cmH20   Intake/Output Summary (Last 24 hours) at 04/16/2021 0959 Last data filed at 04/16/2021 0800 Gross per 24 hour  Intake 2503.88 ml  Output 2000 ml  Net 503.88 ml   Filed Weights   04/17/2021 1821 04/16/21 0400  Weight: 74.8 kg 79.7 kg    Examination: General:  on fentanyl 75 mcg/hr, elderly male in NAD HEENT: MM pink/moist, opens eyes to verbal, right gaze preference, pupils 3/reactive, ETT/ OGT Neuro: opens eyes to verbal, follows command on R 3/5, flaccid left  CV: irir, rate controlled PULM:  non labored on PSV 5/5, coarse, minimal thick tan secretions, unable to lift head off bed, weak cough GI: soft, bs+, foley Extremities: warm/dry, mild generalized edema  Skin: no rashes   7/15 PICC RUE >>  7/14 levaquin (PCN /sulfur allergy (anaphylaxis, no prior documentation of cephalosporins)>>  7/13 MRSA pcr > neg  7/14 trach asp >>  Ct head 7/12 > Acute parenchymal hemorrhage centered in the right basal ganglia region with mild edema and regional mass effect MRI brain 7/14 > Right lentiform intra-axial hemorrhage with estimated blood volume 40 mL is  stable since yesterday. Susceptibility along the margins on DWI with no larger area of infarction. Stable intracranial mass effect with mild leftward midline shift.  [facility] 7/15 >>Large hematoma right external capsule unchanged in size. There is increased mass-effect and edema with mild midline shift to the left.   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 Now complicated by cerebral edema.  - Stroke service primary.  - BP control < 163mmHg; MAP > 65, almost off neosynephrine  - hold further HTN meds - Hypertonic saline-stopped 7/16, trend Na, allow gradual downtrend - serial neuro exams- remains stable thus far - guarded prognosis, will consult PMT to help facilitate GOCs   Acute hypoxemic respiratory failure secondary to aspiration. Aspirating throughout the day 7/14 and then aspirated during intubation -Continue MV support, 8cc/kg IBW with goal Pplat <30 and DP<15  -VAP prevention protocol/ PPI -PAD protocol for sedation> stop fentanyl gtt; start precedex as needed, RASS goal 0/-1 w/ bowel regimen  -wean FiO2 as able for SpO2 >92%  -daily SAT & SBT - weaning well this am on PSV 5/5, will need to have [place] discussion with wife prior to extubation.  I suspect given his deficits and weakness, he will be high risk for reintubation/ aspiration or fail extubation trial.   - Levaquin for aspiration coverage (PCN allergy anaphylaxis) - follow trach asp still pending  - intermittent CXR - prn BD   Proximal Atrial fibrillation: newly diagnosed - Telemetry monitoring - Not a candidate for AC at this time given ICH  - today in Afib, rate controlled - prn metoprolol for rate control if needed   Seizure history - Continue home Keppra - seizure precautions   AKI likely secondary to urinary retention/ sepsis Hyperchloremic NGA Hypokalemia Iatrogenic hypernatremia  Hypophos   - continue FWF 432ml q 6hr - replete kphos again today  - renal function continues to improve thankfully, good UOP - Trend BMP / urinary output - Replace electrolytes as indicated - Avoid nephrotoxic agents, ensure adequate renal perfusion   Hypotension/ septic shock related to aspiration pna - UA neg 7/13 - TTE 7/13- EF 45%, RWA of basal inferior akinesis, basal to mid inferolateral akinesis, and basal anterolateral hypokinesis, mild LVH, G1DD, normal RV - almost off Neo for MAP goal > 65, sedation likely contributing - follow cultures, CXR 7/16 with progressive of bilateral lower lobe inflitrates, although secretions improving, and improving on MV, doing well today on PSV, suspicious fevers may also have a central component - will check PCT, might need to expand abx coverage   Best Practice (right click and "Reselect all SmartList Selections" daily)   Diet/type: NPO; continue TF DVT prophylaxis: SCD GI prophylaxis: PPI Lines: Central line, RUE PICC  Foley:  Yes, and it is still needed; inserted 7/15 2/2 urinary rentention/ AKI Code Status:  full code Last date of multidisciplinary goals of care discussion per primary; wife, [name] [phone], is currently hospitalized at [facility], updated by Dr. Erlinda Hong 7/15.  Pending update 7/17  Labs   CBC: Recent Labs  Lab 03/31/2021 1756 04/01/2021 1804 04/12/21 1324 04/13/21 0238 04/13/21 2320 04/14/21 0409 04/14/21 0518 04/15/21 0404  04/16/21 0553  WBC 7.5  --  8.5 10.0  --  5.0  --  10.6* 9.0  NEUTROABS 6.2  --   --   --   --   --   --   --   --   HGB 16.0   < > 16.6 15.5 13.6 16.1 14.6 13.7 11.9*  HCT 47.4   < > 47.7 45.0 40.0 49.2 43.0 39.9 36.7*  MCV 93.3  --  91.2 92.0  --  98.0  --  94.5 97.9  PLT 160  --  174 173  --  146*  --  77* 69*   < > = values in this interval not displayed.    Basic Metabolic Panel: Recent Labs  Lab 04/14/21 0409 04/14/21 0518 04/14/21 0518 04/14/21 0834 04/14/21 1345 04/14/21 1639 04/14/21 2004  04/14/21 2338 04/15/21 0404 04/15/21 0411 04/15/21 1728 04/15/21 1948 04/16/21 0553  NA 161* 167*  --  161*   < > 153* 151* 159* 161*  --   --  160* 160*  K 3.6 3.6  --  3.6  --   --   --   --  2.9*  --   --  3.1* 3.8  CL >130*  --   --  >130*  --   --   --   --  >130*  --   --  >130* >130*  CO2 19*  --   --  16*  --   --   --   --  22  --   --  25 28  GLUCOSE 110*  --   --  145*  --   --   --   --  116*  --   --  183* 187*  BUN 33*  --   --  39*  --   --   --   --  39*  --   --  37* 36*  CREATININE 2.34*  --   --  2.77*  --   --   --   --  1.54*  --   --  1.22 1.10  CALCIUM 8.2*  --   --  8.4*  --   --   --   --  7.9*  --   --  7.9* 8.0*  MG  --   --    < > 2.1  --  1.7  --   --  1.9 1.8 2.4  --  2.1  PHOS  --   --    < > 1.9*  --  >30.0*  --   --  2.6 2.4* 1.4*  --  1.4*   < > = values in this interval not displayed.   GFR: Estimated Creatinine Clearance: 54.1 mL/min (by C-G formula based on SCr of 1.1 mg/dL). Recent Labs  Lab 04/13/21 0238 04/14/21 0409 04/15/21 0404 04/16/21 0553  WBC 10.0 5.0 10.6* 9.0    Liver Function Tests: Recent Labs  Lab 04/18/2021 1756  AST 22  ALT 20  ALKPHOS 59  BILITOT 1.8*  PROT 7.0  ALBUMIN 4.1   No results for input(s): LIPASE, AMYLASE in the last 168 hours. No results for input(s): AMMONIA in the last 168 hours.  ABG    Component Value Date/Time   PHART 7.259 (L) 04/14/2021 0518   PCO2ART 39.8 04/14/2021 0518   PO2ART 90 04/14/2021 0518   HCO3 17.8 (L) 04/14/2021 0518   TCO2 19 (L) 04/14/2021 0518   ACIDBASEDEF 9.0 (H)  04/14/2021 0518   O2SAT 96.0 04/14/2021 0518     Coagulation Profile: Recent Labs  Lab 04/13/2021 1756  INR 1.0    Cardiac Enzymes: No results for input(s): CKTOTAL, CKMB, CKMBINDEX, TROPONINI in the last 168 hours.  HbA1C: Hgb A1c MFr Bld  Date/Time Value Ref Range Status  04/13/2021 02:38 AM 6.2 (H) 4.8 - 5.6 % Final    Comment:    (NOTE) Pre diabetes:          5.7%-6.4%  Diabetes:               >6.4%  Glycemic control for   <7.0% adults with diabetes     CBG: Recent Labs  Lab 04/15/21 1604 04/15/21 1917 04/15/21 2312 04/16/21 0316 04/16/21 0725  GLUCAP 177* 175* 177* 168* 172*    Critical care time: 30 minutes    Kennieth Rad, ACNP [facility] 04/16/2021, 9:59 AM  See Amion for pager If no response to pager, please call PCCM consult pager After 7:00 pm call Elink

## 2021-04-16 NOTE — Consult Note (Addendum)
Consultation Note Date: 04/16/2021   Patient Name: Richard Summers  DOB: 24-Jun-1941  MRN: [id number]  Age / Sex: 80 y.o., male  PCP: Richard Lemon, MD Referring Physician: Stroke, Md, MD  Reason for Consultation: Establishing goals of care  HPI/Patient Profile: 80 y.o. 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 He initially presented to emergency department on 04/08/2021 after his wife heard him fall in the shower. On EMS arrival he was unable to move his left side. Code stroke was called on arrival to the ED and CT scan showed intracranial hemorrhage and cerebral edema. On 7/14, he was intubated for airway protection in the setting of worsening hypoxia and witnessed aspiration event.   Clinical Assessment and Goals of Care: I have reviewed medical records and examined the patient at bedside. He is intubated and sedated, currently weaning on the vent.   15:15 - Discussed with PCCM/[place] NP. She has spoken with patient's wife [place], who is currently hospitalized at [facility]. Richard Summers stated to PCCM that patient is a DNR and would not want to be on the ventilator. Tentative plan is for one-way extubation in the next few days. She inquires about the possibility of arranging for [place] to visit prior to extubation.   15:25 - I spoke with Richard Summers by phone to discuss this plan of care. Richard Summers shares that she is a good friend of both patient/[name] and his wife [place]. She was actually their realtor when they were re-locating to [place]. Richard [facility] is durable POA for both [facility] and [facility] and she is Economist for [facility]. According to Wonda Cheng and Richard [place] married later in life and do not have children of their own. Richard Summers's health has deteriorated in recent years and [facility] is her caregiver. She is essentially  bed-bound. She is expected to discharge from [facility] in a few days and transfer to rehab. Richard [facility] is doubtful that Richard Summers would be able to travel to [place] by private vehicle.  She asks that I call patient's daughter (from a previous marriage) who lives in [place].   15:45 - I spoke with daughter Richard Summers and introduced the role of palliative care. I reviewed her father's hospital course and current medical condition. Discussed the plan for one-way extubation. Richard Summers shares that this news "is not unexpected". I asked if she wants an opportunity to see her father prior to extubation. She states she has applied for "emergency permission to fly" and that it take up to 72 hours to receive an answer. She feels permission will be granted/approved, but it is not certain. Richard Summers states she does NOT want to delay removal of ventilator support waiting on her to visit.   17:45 - I spoke again with Richard Summers. Let her know of my conversation with [facility]. Also discussed the possibility of arranging a virtual visit through e-link. She is receptive to this as a back-up plan but asks that we continue looking into options for transport  to arrange an in-person visit.   Primary decision maker: [facility] (wife)    SUMMARY OF RECOMMENDATIONS   DNR/DNI as previously documented Continue supportive care for now Plan for one-way extubation in the next few days Doubtful wife will be able to visit in person as she is quite debilitated - will likely have to utilize e-link PMT will continue to follow and support  Code Status/Advance Care Planning: DNR  Palliative Prophylaxis:  Oral Care and Turn Reposition  Additional Recommendations (Limitations, Scope, Preferences): No Tracheostomy  Prognosis:  Unclear how patient will do off the vent  Discharge Planning: To Be Determined      Primary Diagnoses: Present on Admission:  Ischemic cardiomyopathy  MR (mitral regurgitation)  Barrett's esophagus   I have reviewed the  medical record, interviewed the patient and family, and examined the patient. The following aspects are pertinent.  Past Medical History:  Diagnosis Date   Allergic rhinitis    Allergy    Barrett's esophagus    Cataract cortical, senile    Coronary artery disease involving coronary bypass graft of native heart    Epilepsy ([facility])    GERD (gastroesophageal reflux disease)    Glaucoma    Hyperlipidemia    Hypertension    Insomnia    MGUS (monoclonal gammopathy of unknown significance)    Myocardial infarction ([facility])    Nephrolithiasis    Osteoporosis    S/P triple vessel bypass    Sleep apnea    Squamous cell carcinoma of skin 08/06/2019   right crown scalp     Family History  Problem Relation Age of Onset   Seizures Mother    [place] cancer Father    Scheduled Meds:  chlorhexidine gluconate (MEDLINE KIT)  15 mL Mouth Rinse BID   Chlorhexidine Gluconate Cloth  6 each Topical Daily   feeding supplement (PROSource TF)  45 mL Per Tube TID   free water  400 mL Per Tube Q6H   insulin aspart  0-9 Units Subcutaneous Q4H   levETIRAcetam  1,000 mg Per Tube BID   mouth rinse  15 mL Mouth Rinse BID   mouth rinse  15 mL Mouth Rinse 10 times per day   pantoprazole sodium  40 mg Per Tube Daily   polyethylene glycol  17 g Per Tube Daily   senna-docusate  1 tablet Per Tube BID   sodium chloride flush  10-40 mL Intracatheter Q12H   sodium chloride flush  3 mL Intravenous Once   Continuous Infusions:  dexmedetomidine (PRECEDEX) IV infusion 0.4 mcg/kg/hr (04/16/21 1057)   feeding supplement (VITAL 1.5 CAL) 60 mL/hr at 04/16/21 0807   levofloxacin (LEVAQUIN) IV     potassium PHOSPHATE IVPB (in mmol) 30 mmol (04/16/21 1207)   PRN Meds:.acetaminophen **OR** acetaminophen (TYLENOL) oral liquid 160 mg/5 mL **OR** acetaminophen, albuterol, fentaNYL (SUBLIMAZE) injection, fentaNYL (SUBLIMAZE) injection, labetalol, metoprolol tartrate, sodium chloride flush   Allergies  Allergen Reactions    Penicillins Anaphylaxis, Hives and Other (See Comments)   Sulfa Antibiotics Anaphylaxis, Hives and Other (See Comments)   Ezetimibe Other (See Comments)   Rosuvastatin Other (See Comments)   Statins     Other reaction(s): Other (See Comments) Confusion and mania    Review of Systems  Unable to perform ROS: Intubated   Physical Exam Vitals reviewed.  Constitutional:      General: He is not in acute distress.    Appearance: He is ill-appearing.     Comments: sedated  Cardiovascular:  Rate and Rhythm: Normal rate. Rhythm irregularly irregular.     Comments: A-fib   Vital Signs: BP (!) 98/57   Pulse 77   Temp (!) 101.2 F (38.4 C) (Axillary)   Resp 18   Ht 5' 6" (1.676 m)   Wt 79.7 kg   SpO2 93%   BMI 28.36 kg/m  Pain Scale: CPOT   Pain Score: 0-No pain   SpO2: SpO2: 93 % O2 Device:SpO2: 93 %   IO: Intake/output summary:  Intake/Output Summary (Last 24 hours) at 04/16/2021 1251 Last data filed at 04/16/2021 0800 Gross per 24 hour  Intake 1914.33 ml  Output 1625 ml  Net 289.33 ml    LBM: Last BM Date:  (PTA) Baseline Weight: Weight: 74.8 kg Most recent weight: Weight: 79.7 kg      Palliative Assessment/Data: PPS 30%     Time In: 1500 Time Out: 1610 Time Total: 70 minutes Greater than 50%  of this time was spent counseling and coordinating care related to the above assessment and plan.  Signed by:  B , NP   Please contact Palliative Medicine Team phone at [phone] for questions and concerns.  For individual provider: See Amion              

## 2021-04-17 DIAGNOSIS — I255 Ischemic cardiomyopathy: Secondary | ICD-10-CM | POA: Diagnosis not present

## 2021-04-17 DIAGNOSIS — I619 Nontraumatic intracerebral hemorrhage, unspecified: Secondary | ICD-10-CM | POA: Diagnosis not present

## 2021-04-17 DIAGNOSIS — T17908A Unspecified foreign body in respiratory tract, part unspecified causing other injury, initial encounter: Secondary | ICD-10-CM | POA: Diagnosis not present

## 2021-04-17 LAB — RENAL FUNCTION PANEL
Albumin: 2 g/dL — ABNORMAL LOW (ref 3.5–5.0)
Anion gap: 5 (ref 5–15)
BUN: 32 mg/dL — ABNORMAL HIGH (ref 8–23)
CO2: 30 mmol/L (ref 22–32)
Calcium: 8 mg/dL — ABNORMAL LOW (ref 8.9–10.3)
Chloride: 121 mmol/L — ABNORMAL HIGH (ref 98–111)
Creatinine, Ser: 0.85 mg/dL (ref 0.61–1.24)
GFR, Estimated: >60 mL/min (ref >60)
Glucose, Bld: 133 mg/dL — ABNORMAL HIGH (ref 70–99)
Phosphorus: 2.8 mg/dL (ref 2.5–4.6)
Potassium: 3.6 mmol/L (ref 3.5–5.1)
Sodium: 156 mmol/L — ABNORMAL HIGH (ref 135–145)

## 2021-04-17 LAB — GLUCOSE, CAPILLARY
Glucose-Capillary: 117 mg/dL — ABNORMAL HIGH (ref 70–99)
Glucose-Capillary: 167 mg/dL — ABNORMAL HIGH (ref 70–99)
Glucose-Capillary: 223 mg/dL — ABNORMAL HIGH (ref 70–99)
Glucose-Capillary: 212 mg/dL — ABNORMAL HIGH (ref 70–99)
Glucose-Capillary: 235 mg/dL — ABNORMAL HIGH (ref 70–99)
Glucose-Capillary: 222 mg/dL — ABNORMAL HIGH (ref 70–99)

## 2021-04-17 LAB — CBC
HCT: 36.3 % — ABNORMAL LOW (ref 39.0–52.0)
Hemoglobin: 12 g/dL — ABNORMAL LOW (ref 13.0–17.0)
MCH: 31.7 pg (ref 26.0–34.0)
MCHC: 33.1 g/dL (ref 30.0–36.0)
MCV: 96 fL (ref 80.0–100.0)
Platelets: 68 10*3/uL — ABNORMAL LOW (ref 150–400)
RBC: 3.78 MIL/uL — ABNORMAL LOW (ref 4.22–5.81)
RDW: 13.8 % (ref 11.5–15.5)
WBC: 10.7 10*3/uL — ABNORMAL HIGH (ref 4.0–10.5)
nRBC: 0 % (ref 0.0–0.2)

## 2021-04-17 LAB — MAGNESIUM: Magnesium: 1.9 mg/dL (ref 1.7–2.4)

## 2021-04-17 LAB — PROCALCITONIN: Procalcitonin: 7.42 ng/mL

## 2021-04-17 MED ORDER — ENOXAPARIN SODIUM 40 MG/0.4ML IJ SOSY
40.0000 mg | PREFILLED_SYRINGE | INTRAMUSCULAR | Status: DC
  Administered 2021-04-17 – 2021-04-18 (×2): 40 mg via SUBCUTANEOUS
  Filled 2021-04-17 (×2): qty 0.4

## 2021-04-17 MED ORDER — MAGNESIUM SULFATE 2 GM/50ML IV SOLN
2.0000 g | Freq: Once | INTRAVENOUS | Status: AC
  Administered 2021-04-17: 2 g via INTRAVENOUS
  Filled 2021-04-17: qty 50

## 2021-04-17 NOTE — Evaluation (Signed)
Occupational Therapy Evaluation Patient Details Name: Richard Summers MRN: [id number] DOB: [birth year] Today's Date: 04/17/2021    History of Present Illness Pt is a 80 y.o. male who presented 7/12 with L-sided weakness and pt falling. Pt found to have acute R lentiform intra-axial hemorrhage with estimated blood volume 40 mL and mild leftward midline shift, suspecting secondary to HTN. 7/15 intubated due to respiratory failure. PMH: Barrett's esophagus, CAD, epilepsy, glaucoma, HTN, MGUS, MI, nephrolithiasis, osteoporosis, s/p triple vessel bypass, and squamous cell carcinoma of skin.   Clinical Impression   This 80 yo male admitted with above presents to acute OT with PLOF of being independent and taking care of his wife. Currently he is total A for all basic ADLs, +2 for all mobility, left neglect, and follows commands for right side 50% of time. He will continue to benefit from acute OT with follow on CIR if there is family support (per chart does not appear to be so) otherwise will need SNF.    Follow Up Recommendations  CIR;Supervision/Assistance - 24 hour    Equipment Recommendations  None recommended by OT       Precautions / Restrictions Precautions Precautions: Fall Precaution Comments: SBP < 160, ETT Restrictions Weight Bearing Restrictions: No      Mobility Bed Mobility Overal bed mobility: Needs Assistance Bed Mobility: Supine to Sit;Sit to Supine Rolling: Max assist   Supine to sit: Total assist;+2 for physical assistance Sit to supine: Total assist;+2 for physical assistance   General bed mobility comments: pt needing assist to move BLE to EOB, max cues to initiate reach. maxA to complete elevation of trunk from bed and to manage lines/tubes with return.         General transfer comment: deferred due to need for maxA to maintain static sitting    Balance Overall balance assessment: Needs assistance Sitting-balance support: Single extremity supported;Feet  unsupported Sitting balance-Leahy Scale: Zero Sitting balance - Comments: posterior lean, but no pushing today                                   ADL either performed or assessed with clinical judgement   ADL Overall ADL's : Needs assistance/impaired                                       General ADL Comments: total A bed level     Vision   Additional Comments: left neglect, eyes with increased cuing can follow therapist but not able to get quite to midline from far right gaze and eyes immediately go back to right (even with A to turn his head he keeps eyes to far right)            Pertinent Vitals/Pain Pain Assessment: Faces Faces Pain Scale: No hurt Pain Intervention(s): Monitored during session     Hand Dominance Right   Extremity/Trunk Assessment Upper Extremity Assessment Upper Extremity Assessment: LUE deficits/detail LUE Deficits / Details: No AROM noted, edematous, PROM WNL within edema           Communication Communication Communication:  (trach/vent)   Cognition Arousal/Alertness: Awake/alert Behavior During Therapy: Flat affect Overall Cognitive Status: Impaired/Different from baseline Area of Impairment: Following commands;Safety/judgement;Problem solving;Awareness                   Current Attention Level: Selective Memory:  Decreased short-term memory Following Commands: Follows one step commands inconsistently Safety/Judgement: Decreased awareness of deficits;Decreased awareness of safety Awareness: Intellectual Problem Solving: Slow processing;Decreased initiation;Difficulty sequencing;Requires verbal cues;Requires tactile cues General Comments: Pt followed ~50% on the right side (sometimes with increased time), neglect of left side, no pushing this session (compared to last PT note)   General Comments  BPs during session 165/71 (94)--supine; 141/101 (113)--supine; 125/70 (86)-supine all in left arm.  146/106(120)-sitting EOB; 143/93(111)--sitting EOB, 150/103 (115)--supine at end as we left room. RN aware during session.    Exercises Exercises: Other exercises Other Exercises Other Exercises: lateral leaning x 5 each direction. pt initiating to R, not to L Other Exercises: cervical rotation to L with min assist x5        Home Living Family/patient expects to be discharged to:: Skilled nursing facility Living Arrangements: Spouse/significant other Available Help at Discharge: Family Type of Home: House Home Access: Stairs to enter [employer] of Steps: 3 Entrance Stairs-Rails: Can reach both Home Layout: Multi-level;Able to live on main level with bedroom/bathroom     Bathroom Shower/Tub: Occupational psychologist: Standard     Home Equipment: Environmental consultant - 2 wheels;Bedside commode;Grab bars - tub/shower   Additional Comments: Reports wife has medical complications due to COPD and is in hospital bed in [place] with plans to go to SNF.  Lives With: Spouse    Prior Functioning/Environment Level of Independence: Independent        Comments: Pt appears to be the caregiver for his wife who has COPD.        OT Problem List: Decreased strength;Decreased range of motion;Impaired balance (sitting and/or standing);Impaired vision/perception;Impaired UE functional use;Decreased cognition;Impaired sensation;Increased edema;Decreased coordination;Decreased safety awareness;Impaired tone      OT Treatment/Interventions: Self-care/ADL training;DME and/or AE instruction;Patient/family education;Balance training;Therapeutic activities;Therapeutic exercise;Neuromuscular education    OT Goals(Current goals can be found in the care plan section) Acute Rehab OT Goals  OT Goal Formulation: Patient unable to participate in goal setting Time For Goal Achievement: 05/01/21 Potential to Achieve Goals: Good  OT Frequency: Min 2X/week   Barriers to D/C: Decreased caregiver  support          Co-evaluation   Reason for Co-Treatment: Complexity of the patient's impairments (multi-system involvement);Necessary to address cognition/behavior during functional activity;For patient/therapist safety;To address functional/ADL transfers PT goals addressed during session: Mobility/safety with mobility;Balance        AM-PAC OT "6 Clicks" Daily Activity     Outcome Measure Help from another person eating meals?: Total Help from another person taking care of personal grooming?: Total Help from another person toileting, which includes using toliet, bedpan, or urinal?: Total Help from another person bathing (including washing, rinsing, drying)?: Total Help from another person to put on and taking off regular upper body clothing?: Total Help from another person to put on and taking off regular lower body clothing?: Total 6 Click Score: 6   End of Session Nurse Communication:  (increased BPs)  Activity Tolerance: Patient tolerated treatment well Patient left: in bed;with call bell/phone within reach;with bed alarm set;with restraints reapplied (right hand mitt)  OT Visit Diagnosis: Other abnormalities of gait and mobility (R26.89);Muscle weakness (generalized) (M62.81);Low vision, both eyes (H54.2);Other symptoms and signs involving cognitive function;Cognitive communication deficit (R41.841);Hemiplegia and hemiparesis Hemiplegia - Right/Left: Left Hemiplegia - dominant/non-dominant: Dominant Hemiplegia - caused by: Other Nontraumatic intracranial hemorrhage                Time: 3151-7616 OT Time Calculation (min): 34  min Charges:  OT General Charges $OT Visit: 1 Visit OT Evaluation $OT Eval Moderate Complexity: [place], OTR/L Acute [employer] Pager [phone] Office [phone]    Almon Register 04/17/2021, 1:18 PM

## 2021-04-17 NOTE — Progress Notes (Signed)
Physical Therapy Treatment Patient Details Name: Richard Summers MRN: [id number] DOB: 1940-10-13 Today's Date: 04/17/2021    History of Present Illness Pt is a 80 y.o. male who presented 7/12 with L-sided weakness and pt falling. Pt found to have acute R lentiform intra-axial hemorrhage with estimated blood volume 40 mL and mild leftward midline shift, suspecting secondary to HTN. 7/15 intubated due to respiratory failure. PMH: Barrett's esophagus, CAD, epilepsy, glaucoma, HTN, MGUS, MI, nephrolithiasis, osteoporosis, s/p triple vessel bypass, and squamous cell carcinoma of skin.    PT Comments    The pt was agreeable to PT session with focus on progression of OOB mobility and activity tolerance. The pt was able to follow multiple commands with RUE and RLE (~50%), but demos no active movement of L extremities to command at this time. The pt was able to tolerate transition to sitting EOB, but was then unable to progress to OOB mobility due to sustained need for max-total assist to maintain static sitting. Will continue to benefit from skilled PT acutely and following d/c to maximize functional recovery.    Follow Up Recommendations  CIR;Supervision/Assistance - 24 hour     Equipment Recommendations  Wheelchair (measurements PT);Wheelchair cushion (measurements PT);Hospital bed;Other (comment)    Recommendations for Other Services       Precautions / Restrictions Precautions Precautions: Fall Precaution Comments: SBP < 160, ETT Restrictions Weight Bearing Restrictions: No    Mobility  Bed Mobility Overal bed mobility: Needs Assistance Bed Mobility: Supine to Sit;Sit to Supine Rolling: Max assist   Supine to sit: Total assist;+2 for physical assistance Sit to supine: Total assist;+2 for physical assistance   General bed mobility comments: pt needing assist to move BLE to EOB, max cues to initiate reach. maxA to complete elevation of trunk from bed and to manage lines/tubes with  return.    Transfers                 General transfer comment: deferred due to need for maxA to maintain static sitting     Modified Rankin (Stroke Patients Only) Modified Rankin (Stroke Patients Only) Pre-Morbid Rankin Score: No symptoms Modified Rankin: Severe disability     Balance Overall balance assessment: Needs assistance Sitting-balance support: Single extremity supported;Feet unsupported Sitting balance-Leahy Scale: Zero Sitting balance - Comments: posterior lean, but no pushing today                                    Cognition Arousal/Alertness: Awake/alert Behavior During Therapy: Flat affect Overall Cognitive Status: Impaired/Different from baseline Area of Impairment: Following commands;Safety/judgement;Problem solving;Awareness                   Current Attention Level: Selective Memory: Decreased short-term memory Following Commands: Follows one step commands inconsistently Safety/Judgement: Decreased awareness of deficits;Decreased awareness of safety Awareness: Intellectual Problem Solving: Slow processing;Decreased initiation;Difficulty sequencing;Requires verbal cues;Requires tactile cues General Comments: Pt followed ~50% on the right side (sometimes with increased time), neglect of left side, no pushing this session (compared to last PT note)      Exercises Other Exercises Other Exercises: lateral leaning x 5 each direction. pt initiating to R, not to L Other Exercises: cervical rotation to L with min assist x5    General Comments General comments (skin integrity, edema, etc.): BPs during session 165/71 (94)--supine; 141/101 (113)--supine; 125/70 (86)-supine all in left arm. 146/106(120)-sitting EOB; 143/93(111)--sitting EOB, 150/103 (115)--supine at end as  we left room. RN aware during session.      Pertinent Vitals/Pain Pain Assessment: Faces Faces Pain Scale: No hurt Pain Intervention(s): Monitored during session     Home Living Family/patient expects to be discharged to:: Skilled nursing facility Living Arrangements: Spouse/significant other Available Help at Discharge: Family Type of Home: House Home Access: Stairs to enter Entrance Stairs-Rails: Can reach both Home Layout: Multi-level;Able to live on main level with bedroom/bathroom Home Equipment: Gilford Rile - 2 wheels;Bedside commode;Grab bars - tub/shower Additional Comments: Reports wife has medical complications due to COPD and is in hospital bed in [place] with plans to go to SNF.    Prior Function Level of Independence: Independent      Comments: Pt appears to be the caregiver for his wife who has COPD.   PT Goals (current goals can now be found in the care plan section) Acute Rehab PT Goals Patient Stated Goal: to get better PT Goal Formulation: With patient Time For Goal Achievement: 04/27/21 Potential to Achieve Goals: Good Progress towards PT goals: Progressing toward goals    Frequency    Min 4X/week      PT Plan Current plan remains appropriate    Co-evaluation PT/OT/SLP Co-Evaluation/Treatment: Yes Reason for Co-Treatment: Complexity of the patient's impairments (multi-system involvement);Necessary to address cognition/behavior during functional activity;For patient/therapist safety;To address functional/ADL transfers PT goals addressed during session: Mobility/safety with mobility;Balance        AM-PAC PT "6 Clicks" Mobility   Outcome Measure  Help needed turning from your back to your side while in a flat bed without using bedrails?: A Lot Help needed moving from lying on your back to sitting on the side of a flat bed without using bedrails?: A Lot Help needed moving to and from a bed to a chair (including a wheelchair)?: Total Help needed standing up from a chair using your arms (e.g., wheelchair or bedside chair)?: Total Help needed to walk in hospital room?: Total Help needed climbing 3-5 steps with a  railing? : Total 6 Click Score: 8    End of Session Equipment Utilized During Treatment: Oxygen Activity Tolerance: Patient limited by lethargy Patient left: in bed;with call bell/phone within reach;with bed alarm set;with SCD's reapplied Nurse Communication: Mobility status;Need for lift equipment PT Visit Diagnosis: Unsteadiness on feet (R26.81);Muscle weakness (generalized) (M62.81);Difficulty in walking, not elsewhere classified (R26.2);Other symptoms and signs involving the nervous system (R29.898);Hemiplegia and hemiparesis Hemiplegia - Right/Left: Left Hemiplegia - dominant/non-dominant: Non-dominant Hemiplegia - caused by: Nontraumatic intracerebral hemorrhage     Time: 7425-9563 PT Time Calculation (min) (ACUTE ONLY): 35 min  Charges:  $Therapeutic Activity: 8-22 mins                     Inocencio Homes, PT, DPT   Acute Rehabilitation Department Pager #: [phone]   Otho Bellows 04/17/2021, 12:21 PM

## 2021-04-17 NOTE — TOC CAGE-AID Note (Signed)
Transition of Care [facility]) - CAGE-AID Screening   Patient Details  Name: Richard Summers MRN: [id number] Date of Birth: [birth year]  Transition of [facility]) CM/SW Contact:    Elaria Osias C Tarpley-Carter, [place] Phone Number: 04/17/2021, 11:22 AM   Clinical Narrative: Pt is unable to participate in Cage Aid. Pt is intubated.  Delainie Chavana Tarpley-Carter, MSW, LCSW-A Pronouns:  She/Her/Hers                          [place] Clinical Social WorkerTransitions of Care Cell:  [phone] [email or web address]   CAGE-AID Screening: Substance Abuse Screening unable to be completed due to: : Patient unable to participate ((Pt is intubated.))

## 2021-04-17 NOTE — Progress Notes (Signed)
NAME:  Richard Summers, MRN:  [id number], DOB:  1941-02-10, LOS: 6 ADMISSION DATE:  04/02/2021, CONSULTATION DATE:  7/14 REFERRING MD:  Dr. Cheral Marker, CHIEF COMPLAINT:  ICH   History of Present Illness:  Patient is encephalopathic and/or intubated. Therefore history has been obtained from chart review.   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  According to reports the patient's wife heard him fall while taking a shower and called EMS.  Upon their arrival he was unable to move his left side.  He was transported to [facility] emergency department as a code stroke and CT scan upon arrival demonstrated intercranial hemorrhage.  Cerebral edema also demonstrated on scan.  He was admitted to the neuro ICU for close monitoring and Cleviprex infusion for blood pressure goal less than 140 mmHg. He was started on hypertonic saline for worsening cerebral edema. 4/14 there was some concern for his airway. He was coughing after drinking nectar thick liquid and having upper airway secretions despite suctioning. When he became hypoxic requiring increased oxygen, PCCM was consulted.   Pertinent  Medical History   has a past medical history of Allergic rhinitis, Allergy, Barrett's esophagus, Cataract cortical, senile, Coronary artery disease involving coronary bypass graft of native heart, Epilepsy ([place]), GERD (gastroesophageal reflux disease), Glaucoma, Hyperlipidemia, Hypertension, Insomnia, MGUS (monoclonal gammopathy of unknown significance), Myocardial infarction ([place]), Nephrolithiasis, Osteoporosis, S/P triple vessel bypass, Sleep apnea, and Squamous cell carcinoma of skin (08/06/2019).   Significant Hospital Events: Including procedures, antibiotic start and stop dates in addition to other pertinent events   7/12 admitted with [place] 7/14 intubated for airway  protection given worsening hypoxia and witnessed aspiration; started on levaquin   7/15 tmax 101, HTS off, urinary retention/ sCr up -foley replaced, off cleviprex but hypotensive requiring vasopressors, repeated CTH, R PICC placed 7/16 no SBT given tachypnea at baseline, off HTS, neuro stable, tmax 100.6, improving sCr/ great UOP, off Neo, started on HTN meds  Interim History / Subjective:  Patient remains off vasopressors He is afebrile Palliative care medicine had discussion with wife yesterday, she does not want him to get reintubated or tracheostomy.  Objective   Blood pressure 131/64, pulse 72, temperature (!) 100.9 F (38.3 C), temperature source Axillary, resp. rate 14, height 5\' 6"  (1.676 m), weight 79.7 kg, SpO2 95 %.    Vent Mode: PSV;CPAP FiO2 (%):  [40 %] 40 % Set Rate:  [22 bmp] 22 bmp Vt Set:  [510 mL] 510 mL PEEP:  [5 cmH20] 5 cmH20 Pressure Support:  [5 cmH20] 5 cmH20 Plateau Pressure:  [14 cmH20-20 cmH20] 14 cmH20   Intake/Output Summary (Last 24 hours) at 04/17/2021 0939 Last data filed at 04/17/2021 0745 Gross per 24 hour  Intake 3665.87 ml  Output 2250 ml  Net 1415.87 ml   Filed Weights   04/29/2021 1821 04/16/21 0400  Weight: 74.8 kg 79.7 kg    Examination:   Physical exam: General: Crtitically ill-appearing elderly Caucasian male, orally intubated HEENT: [place]/AT, eyes anicteric.  ETT and OGT in place Neuro: Opens eyes with vocal stimuli, following commands, antigravity on right side, plegic on left side Chest: Coarse breath sounds, no wheezes or rhonchi Heart: Irregularly irregular, no murmurs or gallops Abdomen: Soft, nontender, nondistended, bowel sounds present Skin: No rash   Resolved Hospital Problem list   Septic shock Acute kidney injury  Assessment & Plan:  Right-sided basal ganglia intraparenchymal hemorrhage  likely due to hypertension Cerebral edema, improved Induced hypernatremia with hypertonic saline Continue secondary stroke  prophylaxis SBP goal <160 Continue secondary stroke prophylaxis Patient is off hypertonic saline Hold antiplatelet therapy  Acute hypoxemic respiratory failure secondary to aspiration pneumonia with gram-positive cocci in pairs and chains Continue lung protective ventilation Patient is tolerating spontaneous breathing trial, watch for respiratory distress VAP prevention protocol/ PPI I suspect given his deficits and weakness, he will be high risk for reintubation/ aspiration or fail extubation trial.   Continue Levaquin Patient's wife would like to have him one-way extubation, no tracheostomy  Proximal Atrial fibrillation: newly diagnosed Heart rate is well controlled Not a candidate for [facility] at this time given ICH  Continue as needed metoprolol  Seizure disorder Continue home Keppra Continue seizure precautions  AKI likely secondary to urinary retention/ sepsis, improved Hyperchloremic NGA Hypokalemia Hypophosphatemia Serum sodium is improving now down to 156 Continue FWF 420ml q 6hr Continue aggressive electrolyte supplements Serum creatinine trended down, now back to baseline Continue Foley catheter  Hypotension/ septic shock related to aspiration pna, improved  Chronic systolic congestive heart failure Patient looks euvolemic Monitor intake and output  Best Practice (right click and "Reselect all SmartList Selections" daily)   Diet/type: NPO; continue TF DVT prophylaxis: SCD GI prophylaxis: PPI Lines: Central line, RUE PICC  Foley:  Yes, and it is still needed; inserted 7/15 2/2 urinary rentention/ AKI Code Status: DNR Last date of multidisciplinary goals of care discussion per primary; wife, [name] [phone], is currently hospitalized at [facility], updated on 7/17  Labs   CBC: Recent Labs  Lab 04/13/2021 1756 04/30/2021 1804 04/13/21 0238 04/13/21 2320 04/14/21 0409 04/14/21 0518 04/15/21 0404 04/16/21 0553 04/17/21 0527  WBC 7.5   < > 10.0  --  5.0  --  10.6*  9.0 10.7*  NEUTROABS 6.2  --   --   --   --   --   --   --   --   HGB 16.0   < > 15.5   < > 16.1 14.6 13.7 11.9* 12.0*  HCT 47.4   < > 45.0   < > 49.2 43.0 39.9 36.7* 36.3*  MCV 93.3   < > 92.0  --  98.0  --  94.5 97.9 96.0  PLT 160   < > 173  --  146*  --  77* 69* 68*   < > = values in this interval not displayed.    Basic Metabolic Panel: Recent Labs  Lab 04/14/21 0834 04/14/21 1345 04/14/21 2338 04/15/21 0404 04/15/21 0411 04/15/21 1728 04/15/21 1948 04/16/21 0553 04/17/21 0527  NA 161*   < > 159* 161*  --   --  160* 160* 156*  K 3.6  --   --  2.9*  --   --  3.1* 3.8 3.6  CL >130*  --   --  >130*  --   --  >130* >130* 121*  CO2 16*  --   --  22  --   --  25 28 30   GLUCOSE 145*  --   --  116*  --   --  183* 187* 133*  BUN 39*  --   --  39*  --   --  37* 36* 32*  CREATININE 2.77*  --   --  1.54*  --   --  1.22 1.10 0.85  CALCIUM 8.4*  --   --  7.9*  --   --  7.9* 8.0* 8.0*  MG  2.1   < >  --  1.9 1.8 2.4  --  2.1 1.9  PHOS 1.9*   < >  --  2.6 2.4* 1.4*  --  1.4* 2.8   < > = values in this interval not displayed.   GFR: Estimated Creatinine Clearance: 70 mL/min (by C-G formula based on SCr of 0.85 mg/dL). Recent Labs  Lab 04/14/21 0409 04/15/21 0404 04/16/21 0553 04/17/21 0527  PROCALCITON  --   --  13.93 7.42  WBC 5.0 10.6* 9.0 10.7*    Liver Function Tests: Recent Labs  Lab 04/04/2021 1756 04/17/21 0527  AST 22  --   ALT 20  --   ALKPHOS 59  --   BILITOT 1.8*  --   PROT 7.0  --   ALBUMIN 4.1 2.0*   No results for input(s): LIPASE, AMYLASE in the last 168 hours. No results for input(s): AMMONIA in the last 168 hours.  ABG    Component Value Date/Time   PHART 7.259 (L) 04/14/2021 0518   PCO2ART 39.8 04/14/2021 0518   PO2ART 90 04/14/2021 0518   HCO3 17.8 (L) 04/14/2021 0518   TCO2 19 (L) 04/14/2021 0518   ACIDBASEDEF 9.0 (H) 04/14/2021 0518   O2SAT 96.0 04/14/2021 0518     Coagulation Profile: Recent Labs  Lab 04/17/2021 1756  INR 1.0     Cardiac Enzymes: No results for input(s): CKTOTAL, CKMB, CKMBINDEX, TROPONINI in the last 168 hours.  HbA1C: Hgb A1c MFr Bld  Date/Time Value Ref Range Status  04/13/2021 02:38 AM 6.2 (H) 4.8 - 5.6 % Final    Comment:    (NOTE) Pre diabetes:          5.7%-6.4%  Diabetes:              >6.4%  Glycemic control for   <7.0% adults with diabetes     CBG: Recent Labs  Lab 04/16/21 1626 04/16/21 1938 04/16/21 2312 04/17/21 0403 04/17/21 0737  GLUCAP 204* 251* 239* 117* 167*   Total critical care time: 46 minutes  Performed by: [place] care time was exclusive of separately billable procedures and treating other patients.   Critical care was necessary to treat or prevent imminent or life-threatening deterioration.   Critical care was time spent personally by me on the following activities: development of treatment plan with patient and/or surrogate as well as nursing, discussions with consultants, evaluation of patient's response to treatment, examination of patient, obtaining history from patient or surrogate, ordering and performing treatments and interventions, ordering and review of laboratory studies, ordering and review of radiographic studies, pulse oximetry and re-evaluation of patient's condition.   Jacky Kindle MD [place] [facility] See Amion for pager If no response to pager, please call [phone] until 7pm After 7pm, Please call E-link [phone]

## 2021-04-17 NOTE — Progress Notes (Signed)
STROKE TEAM PROGRESS NOTE   SUBJECTIVE (INTERVAL HISTORY) His RN is at the bedside. Pt is presently on weaning trial since this morning.  And seems to be doing well.  He remains on fentanyl, but is off vasopressors.  Na 156 today,.  Neurological exam remains unchanged with patient no more awake and easily following commands consistently on the right and remains with dense left hemiplegia and neglect.  Renal function continues to improve to 0.85 today.  He still having temperature 100.9 but WBC count is minimally elevated at 10.7  OBJECTIVE Temp:  [98.4 F (36.9 C)-100.9 F (38.3 C)] 100.2 F (37.9 C) (07/18 1200) Pulse Rate:  [50-91] 81 (07/18 1300) Cardiac Rhythm: Atrial fibrillation (07/18 0800) Resp:  [14-24] 16 (07/18 1300) BP: (95-165)/(54-85) 110/65 (07/18 1300) SpO2:  [92 %-97 %] 92 % (07/18 1514) FiO2 (%):  [40 %] 40 % (07/18 1514)  Recent Labs  Lab 04/16/21 2312 04/17/21 0403 04/17/21 0737 04/17/21 1201 04/17/21 1522  GLUCAP 239* 117* 167* 223* 212*   Recent Labs  Lab 04/14/21 0834 04/14/21 1345 04/14/21 2338 04/15/21 0404 04/15/21 0411 04/15/21 1728 04/15/21 1948 04/16/21 0553 04/17/21 0527  NA 161*   < > 159* 161*  --   --  160* 160* 156*  K 3.6  --   --  2.9*  --   --  3.1* 3.8 3.6  CL >130*  --   --  >130*  --   --  >130* >130* 121*  CO2 16*  --   --  22  --   --  25 28 30   GLUCOSE 145*  --   --  116*  --   --  183* 187* 133*  BUN 39*  --   --  39*  --   --  37* 36* 32*  CREATININE 2.77*  --   --  1.54*  --   --  1.22 1.10 0.85  CALCIUM 8.4*  --   --  7.9*  --   --  7.9* 8.0* 8.0*  MG 2.1   < >  --  1.9 1.8 2.4  --  2.1 1.9  PHOS 1.9*   < >  --  2.6 2.4* 1.4*  --  1.4* 2.8   < > = values in this interval not displayed.   Recent Labs  Lab 04/12/2021 1756 04/17/21 0527  AST 22  --   ALT 20  --   ALKPHOS 59  --   BILITOT 1.8*  --   PROT 7.0  --   ALBUMIN 4.1 2.0*   Recent Labs  Lab 04/15/2021 1756 04/03/2021 1804 04/13/21 0238 04/13/21 2320  04/14/21 0409 04/14/21 0518 04/15/21 0404 04/16/21 0553 04/17/21 0527  WBC 7.5   < > 10.0  --  5.0  --  10.6* 9.0 10.7*  NEUTROABS 6.2  --   --   --   --   --   --   --   --   HGB 16.0   < > 15.5   < > 16.1 14.6 13.7 11.9* 12.0*  HCT 47.4   < > 45.0   < > 49.2 43.0 39.9 36.7* 36.3*  MCV 93.3   < > 92.0  --  98.0  --  94.5 97.9 96.0  PLT 160   < > 173  --  146*  --  77* 69* 68*   < > = values in this interval not displayed.   No results for input(s): CKTOTAL, CKMB, CKMBINDEX,  TROPONINI in the last 168 hours. No results for input(s): LABPROT, INR in the last 72 hours.  No results for input(s): COLORURINE, LABSPEC, [place], GLUCOSEU, HGBUR, BILIRUBINUR, KETONESUR, PROTEINUR, UROBILINOGEN, NITRITE, LEUKOCYTESUR in the last 72 hours.  Invalid input(s): APPERANCEUR      Component Value Date/Time   CHOL 239 (H) 04/13/2021 0238   TRIG 62 04/14/2021 0409   HDL 72 04/13/2021 0238   CHOLHDL 3.3 04/13/2021 0238   VLDL 18 04/13/2021 0238   LDLCALC 149 (H) 04/13/2021 0238   Lab Results  Component Value Date   HGBA1C 6.2 (H) 04/13/2021      Component Value Date/Time   LABOPIA NONE DETECTED 04/12/2021 0107   COCAINSCRNUR NONE DETECTED 04/12/2021 0107   LABBENZ NONE DETECTED 04/12/2021 0107   AMPHETMU NONE DETECTED 04/12/2021 0107   THCU NONE DETECTED 04/12/2021 0107   LABBARB NONE DETECTED 04/12/2021 0107    No results for input(s): ETH in the last 168 hours.  I have personally reviewed the radiological images below and agree with the radiology interpretations.  CT ANGIO HEAD W OR WO CONTRAST  Result Date: 04/12/2021 CLINICAL DATA:  80 year old male code stroke presentation on 04/24/2021 with right lentiform intra-axial hemorrhage. Subsequent encounter. EXAM: CT ANGIOGRAPHY HEAD AND NECK TECHNIQUE: Multidetector CT imaging of the head and neck was performed using the standard protocol during bolus administration of intravenous contrast. Multiplanar CT image reconstructions and MIPs  were obtained to evaluate the vascular anatomy. Carotid stenosis measurements (when applicable) are obtained utilizing NASCET criteria, using the distal internal carotid diameter as the denominator. CONTRAST:  4mL OMNIPAQUE IOHEXOL 350 MG/ML SOLN COMPARISON:  Head CT 04/10/2021. FINDINGS: CT HEAD Brain: Calcified atherosclerosis at the skull base. Hyperdense intra-axial hemorrhage centered at the right lentiform encompasses 62 by 28 x 49 mm (AP by transverse by CC) for an estimated blood volume of 43 mL, and has mildly extended since presentation (approximately 31 mL at that time). But surrounding edema and regional mass effect have not significantly changed. Partially effaced right lateral ventricle with no ventriculomegaly. No intraventricular or extra-axial extension of blood. No significant midline shift. Basilar cisterns remain patent. Stable gray-white matter differentiation elsewhere. 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 Calvarium and skull base: Intact, negative. Paranasal sinuses: Continued paranasal sinus opacification maximal in the ethmoids. Tympanic cavities and mastoids remain clear. Orbits: Visualized orbits and scalp soft tissues are within normal limits. Negative noncontrast deep soft tissue spaces of the face. CTA NECK Skeleton: Right side TMJ degeneration. Widespread cervical spine degeneration. No acute or suspicious osseous lesion identified. Upper chest: Mild upper lobe lung scarring mostly on the left. Retained secretions, bubbly opacity in the visible trachea. No superior mediastinal lymphadenopathy. Other neck: Negative.  No neck mass or lymphadenopathy. Aortic arch: Extensive soft and calcified arch atherosclerosis. Three vessel arch configuration. Great vessel origins relatively spared. Right carotid system: Mild brachiocephalic artery and right CCA origin atherosclerosis without stenosis. Mildly tortuous right CCA. Mostly calcified plaque at the right carotid bifurcation  and posterior right ICA bulb without stenosis. Mildly tortuous ICA to the skull base. Left carotid system: Minimal plaque at the left CCA origin without stenosis. Soft plaque in the medial vessel at the level of the larynx on series 5, image 131 without stenosis. Minor plaque at the left ICA origin without stenosis. Mildly tortuous left ICA to the skull base. Vertebral arteries: Soft and calcified plaque in the proximal right subclavian artery without stenosis. Right vertebral artery origin remains normal. Tortuous right V1 and  proximal V2 segments. Patent right vertebral artery to the skull base without significant plaque or stenosis. Soft and calcified plaque in the proximal left subclavian artery without stenosis. Mild involvement of the left vertebral artery origin without stenosis. Tortuous left V1 and proximal V2 segments. Codominant left vertebral artery is patent to the skull base without significant plaque or stenosis. CTA HEAD Posterior circulation: Codominant distal vertebral arteries with mild calcified plaque mostly on the left. No significant stenosis. Patent PICA origins and vertebrobasilar junction. Patent basilar artery with mild irregularity but no significant stenosis. Patent SCA and right PCA origins. Fetal left PCA origin. Right posterior communicating artery diminutive or absent. Bilateral P2 segments are mildly irregular without stenosis. But there is bilateral P3 segment stenosis, mild to moderate involving the inferior division on the left (series 10, image 31) and moderate to severe involving the right P3 trunk on series 10, image 26. Anterior circulation: Both ICA siphons are patent. Abundant siphon calcified plaque on both sides but no significant stenosis on the left. There is moderate stenosis of the right supraclinoid ICA on series 7, image 93. Normal left posterior communicating artery. Patent carotid termini. MCA and ACA origins are within normal limits. No convincing CTA spot sign  associated with the right lentiform hemorrhage. Mildly dominant and tortuous right ACA A1 segment. Mild left A1 irregularity without stenosis. 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 Otherwise the bilateral ACA branches are within normal limits. Left MCA M1 segment and bifurcation are patent without stenosis. Left MCA branches are within normal limits. Right MCA M1 segment and bifurcation are patent without stenosis. Right MCA branches are within normal limits. Venous sinuses: Early contrast timing but grossly patent. Anatomic variants: Fetal left PCA origin. Mildly dominant right ACA A1. Review of the MIP images confirms the above findings IMPRESSION: 1. Right lentiform intra-axial hemorrhage has slightly enlarged since presentation, estimated blood volume now 43 mL (previously 31 mL). But there is no CTA spot sign, and mild regional edema and mass effect are stable. 2. 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 There is up to Moderate stenosis of the Right supraclinoid ICA due to calcified plaque. And there is Moderate To Severe bilateral PCA P3 segment stenosis. No significant Vertebrobasilar or cervical carotid stenosis. 3. Aortic Atherosclerosis (ICD10-I70.0). Electronically Signed   By: Genevie Ann M.D.   On: 04/12/2021 06:27   CT HEAD WO CONTRAST  Result Date: 04/14/2021 CLINICAL DATA:  Intracranial hemorrhage follow-up EXAM: CT HEAD WITHOUT CONTRAST TECHNIQUE: Contiguous axial images were obtained from the base of the skull through the vertex without intravenous contrast. COMPARISON:  CT head 04/29/2021.  MRI head 04/13/2021 FINDINGS: Brain: High-density acute hemorrhage in the right lateral basal ganglia measures approximately 5.7 x 2.5 x 3.6 cm. Hematoma size is stable. There is surrounding low-density edema. There is progressive mass-effect on the right lateral ventricle.  Mild midline shift to the left slightly increased. Chronic lacunar infarct in the head of caudate on the left and in the left subinsular white matter, unchanged. No acute ischemic infarct or mass identified. Vascular: Negative for hyperdense vessel Skull: Negative Sinuses/Orbits: Mucosal  edema paranasal sinuses.  Negative orbit Other: None IMPRESSION: Large hematoma right external capsule unchanged in size. There is increased mass-effect and edema with mild midline shift to the left. Electronically Signed   By: Franchot Gallo M.D.   On: 04/14/2021 14:52  MR BRAIN WO CONTRAST  Result Date: 04/13/2021 CLINICAL DATA:  80 year old male code stroke presentation on 04/20/2021 with right lentiform intra-axial hemorrhage. EXAM: MRI HEAD WITHOUT CONTRAST TECHNIQUE: Multiplanar, multiecho pulse sequences of the brain and surrounding structures were obtained without intravenous contrast. COMPARISON:  CTA head and neck 04/12/2021 and earlier. FINDINGS: Brain: Mix signal intra-axial hemorrhage centered at the right lentiform encompasses 68 by 26 by 45 mm (AP by transverse by CC) for an estimated blood volume a 40 mL, stable since 04/12/2021. Surrounding edema. Susceptibility artifact along the margins of the hematoma on DWI. No larger area of restricted diffusion surrounding the blood. No restricted diffusion elsewhere. Stable mass effect on the right lateral ventricle. No intraventricular extension. No extra-axial extension. Mild leftward midline shift of 3-4 mm is stable. No ventriculomegaly. Basilar cisterns remain patent. No chronic cerebral blood products elsewhere on T2* imaging. 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 No cortical encephalomalacia. Brainstem and cerebellum appear negative. Cervicomedullary junction and pituitary are within normal limits. Vascular: Major  intracranial vascular flow voids are preserved. Skull and upper cervical spine: Negative visible cervical spine. Visualized bone marrow signal is within normal limits. Sinuses/Orbits: Negative orbits. Moderate ethmoid and sphenoid sinus mucosal thickening. Mild maxillary sinus involvement. Other: Mastoids are clear. Visible internal auditory structures appear normal. Negative visible scalp and face. IMPRESSION: 1. Right lentiform intra-axial hemorrhage with estimated blood volume 40 mL is stable since yesterday. Susceptibility along the margins on DWI with no larger area of infarction. Stable intracranial mass effect with mild leftward midline shift. 2. No underlying chronic cerebral blood products, but there are contralateral chronic lacunar infarcts in the left basal ganglia. Electronically Signed   By: Genevie Ann M.D.   On: 04/13/2021 11:50   DG CHEST PORT 1 VIEW  Result Date: 04/15/2021 CLINICAL DATA:  Ventilator dependent.  Evaluate endotracheal tube. EXAM: PORTABLE CHEST 1 VIEW COMPARISON:  04/13/2021 FINDINGS: Endotracheal tube is in place with tip 3.8 centimeters above the carina. Nasogastric tube is in place with tip beyond the edge of the image, at least to the level of the proximal stomach. RIGHT-sided PICC line tip overlies the level of the superior vena cava. Heart size is accentuated by portable technique. There has been interval development of patchy infiltrates in the LOWER lobes bilaterally, consistent with infectious infiltrates. No evidence for pulmonary edema. IMPRESSION: Interval development of bilateral LOWER lobe infiltrates. Electronically Signed   By: Nolon Nations M.D.   On: 04/15/2021 13:26   DG CHEST PORT 1 VIEW  Result Date: 04/13/2021 CLINICAL DATA:  Check endotracheal tube placement EXAM: PORTABLE CHEST 1 VIEW COMPARISON:  04/12/2021 FINDINGS: Endotracheal tube is noted in place 16 mm above the carina. Gastric catheter is noted coiled within the stomach. Cardiac shadow is  stable. The lungs are well aerated bilaterally. Mild central vascular congestion is seen without edema. IMPRESSION: Endotracheal tube in satisfactory position. Gastric catheter within the stomach. Mild vascular congestion without interstitial edema. Electronically Signed   By: Inez Catalina M.D.   On: 04/13/2021 21:55   DG CHEST PORT 1 VIEW  Result Date: 04/12/2021 CLINICAL DATA:  80 year old male with intracranial hemorrhage. Hypoxia. EXAM: PORTABLE CHEST 1 VIEW COMPARISON:  Chest radiographs 08/28/2006. CTA neck today reported separately. FINDINGS: Portable AP semi upright view at 0415 hours. Mildly lower lung volumes compared to 2007. Normal cardiac size and mediastinal contours. Visualized tracheal air  column is within normal limits. Allowing for portable technique the lungs are clear. No pneumothorax or pleural effusion. Chronic left anterior rib cerclage wires are stable. Paucity of bowel gas in the upper abdomen. IMPRESSION: No acute cardiopulmonary abnormality. Electronically Signed   By: Genevie Ann M.D.   On: 04/12/2021 06:29   DG Swallowing Func-Speech Pathology  Result Date: 04/12/2021 Formatting of this result is different from the original. 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 Plates and screws installed  TONSILLECTOMY    triple bypass    VASECTOMY   HPI: 80 yr old man  with history of epilepsy, Barrett's esophagus, GERD, HTN, MI s/p bypass, and OSA who had a fall in the shower with sudden left sided  hemiplegia with a fixed rt gaze. CT revealed right Basal Ganglia hemorrhage. CT repeated am 7/13 with right lentiform intra-axial hemorrhage has slightly enlarged since presentation.  No data recorded Assessment / Plan / Recommendation CHL IP CLINICAL IMPRESSIONS 04/12/2021 Clinical Impression Oral phase of swallow marked by decreased cohesion, ROM and manipulation resulted in anterior spill, left lateral sulci residue, lingual residue, holding and transit delays. Lingual residue spilled to valleculae and cleared with cues and spontaneous swallows. Pharyngeal and laryngeal mobility and strength were adequate once swallow was initiated. Poor timing to close larynx occured with thin liquid barium resulting in incomplete laryngeal closure and aspiration with reflexive cough (PAS 7) unable to clear trachea. Pt was very restless, pushing to his left, distracted and therapist unable to attempt compensatory strategies during study. Recommend Dys 1 (puree), nectar thick liquids, crush pills, check left buccal cavity for pocketing, limit distractions, straws allowed and full supervision and assist. SLP Visit Diagnosis Dysphagia, oropharyngeal phase (R13.12) Attention and concentration deficit following -- Frontal lobe and executive function deficit following -- Impact on safety and function Moderate aspiration risk;Mild aspiration risk   CHL IP TREATMENT RECOMMENDATION 04/12/2021 Treatment Recommendations Therapy as  outlined in treatment plan below   Prognosis 04/12/2021 Prognosis for Safe Diet Advancement Good Barriers to Reach Goals Cognitive deficits Barriers/Prognosis Comment -- CHL IP DIET RECOMMENDATION 04/12/2021 SLP Diet Recommendations Dysphagia 1 (Puree) solids;Nectar thick liquid Liquid Administration via Straw;Cup Medication Administration Crushed with puree Compensations Minimize environmental distractions;Slow rate;Small sips/bites;Lingual sweep for clearance of pocketing;Monitor for anterior loss Postural Changes Seated upright at 90 degrees   CHL IP OTHER RECOMMENDATIONS 04/12/2021 Recommended Consults -- Oral Care Recommendations Oral care BID Other Recommendations --   CHL IP  FOLLOW UP RECOMMENDATIONS 04/12/2021 Follow up Recommendations Inpatient Rehab   CHL IP FREQUENCY AND DURATION 04/12/2021 Speech Therapy Frequency (ACUTE ONLY) min 2x/week Treatment Duration 2 weeks      CHL IP ORAL PHASE 04/12/2021 Oral Phase Impaired Oral - Pudding Teaspoon -- Oral - Pudding Cup -- Oral - Honey Teaspoon -- Oral - Honey Cup -- Oral - Nectar Teaspoon Left anterior bolus loss;Reduced posterior propulsion;Decreased bolus cohesion;Left pocketing in lateral sulci;Lingual/palatal residue Oral - Nectar Cup Left anterior bolus loss;Reduced posterior propulsion;Decreased bolus cohesion;Left pocketing in lateral sulci;Lingual/palatal residue Oral - Nectar Straw Reduced posterior propulsion;Delayed oral transit;Lingual/palatal residue Oral - Thin Teaspoon Left anterior bolus loss;Reduced posterior propulsion;Decreased bolus cohesion;Holding of bolus Oral - Thin Cup Left anterior bolus loss;Reduced posterior propulsion;Decreased bolus cohesion;Holding of bolus Oral - Thin Straw -- Oral - Puree Holding of bolus;Lingual/palatal residue;Delayed oral transit Oral - Mech Soft -- Oral - Regular Left pocketing in lateral sulci;Delayed oral transit Oral - Multi-Consistency -- Oral - Pill -- Oral Phase - Comment --  CHL IP PHARYNGEAL PHASE  04/12/2021 Pharyngeal Phase Impaired Pharyngeal- Pudding Teaspoon -- Pharyngeal -- Pharyngeal- Pudding Cup -- Pharyngeal -- Pharyngeal- Honey Teaspoon -- Pharyngeal -- Pharyngeal- Honey Cup -- Pharyngeal -- Pharyngeal- Nectar Teaspoon WFL Pharyngeal -- Pharyngeal- Nectar Cup Pharyngeal residue - valleculae Pharyngeal -- Pharyngeal- Nectar Straw WFL Pharyngeal -- Pharyngeal- Thin Teaspoon WFL Pharyngeal -- Pharyngeal- Thin Cup Penetration/Aspiration during swallow Pharyngeal Material enters airway, passes BELOW cords and not ejected out despite cough attempt by patient Pharyngeal- Thin Straw -- Pharyngeal -- Pharyngeal- Puree -- Pharyngeal -- Pharyngeal- Mechanical Soft -- Pharyngeal -- Pharyngeal- Regular -- Pharyngeal -- Pharyngeal- Multi-consistency -- Pharyngeal -- Pharyngeal- Pill -- Pharyngeal -- Pharyngeal Comment --  No flowsheet data found. [place] Siren 04/12/2021, 2:31 PM              ECHOCARDIOGRAM COMPLETE  Result Date: 04/12/2021    ECHOCARDIOGRAM REPORT   Patient Name:   Richard Summers Date of Exam: 04/12/2021 Medical Rec #:  [id number]      Height:       66.0 in Accession #:    [id number]     Weight:       164.9 lb Date of Birth:  26-Dec-1940      BSA:          1.842 m Patient Age:    9 years       BP:           146/75 mmHg Patient Gender: M              HR:           80 bpm. Exam Location:  Inpatient Procedure: 2D Echo, Cardiac Doppler and Color Doppler Indications:    Stroke I63.9  History:        Patient has no prior history of Echocardiogram examinations. CAD                 and Previous Myocardial Infarction; Risk Factors:Dyslipidemia                 and Hypertension.  Sonographer:    Bernadene Person RDCS Referring Phys: [id number] [place]  1. Left ventricular ejection fraction, by estimation, is 45%. The left ventricle demonstrates regional wall motion abnormalities with basal inferior akinesis, basal to mid inferolateral akinesis, basal anterolateral hypokinesis. There  is mild left ventricular hypertrophy. Left ventricular diastolic parameters are consistent with Grade I diastolic dysfunction (impaired relaxation).  2. Right ventricular  systolic function is normal. The right ventricular size is normal. Tricuspid regurgitation signal is inadequate for assessing PA pressure.  3. Left atrial size was mildly dilated.  4. The mitral valve is normal in structure. Trivial mitral valve regurgitation. No evidence of mitral stenosis.  5. The aortic valve is tricuspid. Aortic valve regurgitation is not visualized. Mild aortic valve sclerosis is present, with no evidence of aortic valve stenosis. FINDINGS  Left Ventricle: Left ventricular ejection fraction, by estimation, is 45%. The left ventricle has mildly decreased function. The left ventricle demonstrates regional wall motion abnormalities. The left ventricular internal cavity size was normal in size. There is mild left ventricular hypertrophy. Left ventricular diastolic parameters are consistent with Grade I diastolic dysfunction (impaired relaxation). Right Ventricle: The right ventricular size is normal. No increase in right ventricular wall thickness. Right ventricular systolic function is normal. Tricuspid regurgitation signal is inadequate for assessing PA pressure. Left Atrium: Left atrial size was mildly dilated. Right Atrium: Right atrial size was normal in size. Pericardium: There is no evidence of pericardial effusion. Mitral Valve: The mitral valve is normal in structure. Mild mitral annular calcification. Trivial mitral valve regurgitation. No evidence of mitral valve stenosis. Tricuspid Valve: The tricuspid valve is normal in structure. Tricuspid valve regurgitation is not demonstrated. Aortic Valve: The aortic valve is tricuspid. Aortic valve regurgitation is not visualized. Mild aortic valve sclerosis is present, with no evidence of aortic valve stenosis. Pulmonic Valve: The pulmonic valve was normal in structure.  Pulmonic valve regurgitation is not visualized. Aorta: The aortic root is normal in size and structure. Venous: The inferior vena cava was not well visualized. IAS/Shunts: No atrial level shunt detected by color flow Doppler.  LEFT VENTRICLE PLAX 2D LVIDd:         3.90 cm LVIDs:         2.70 cm LV PW:         1.10 cm LV IVS:        1.00 cm LVOT diam:     2.30 cm LV SV:         74 LV SV Index:   40 LVOT Area:     4.15 cm  RIGHT VENTRICLE RV S prime:     12.50 cm/s TAPSE (M-mode): 1.7 cm LEFT ATRIUM             Index       RIGHT ATRIUM           Index LA diam:        5.00 cm 2.71 cm/m  RA Area:     17.70 cm LA Vol (A2C):   55.0 ml 29.85 ml/m RA Volume:   51.20 ml  27.79 ml/m LA Vol (A4C):   59.6 ml 32.35 ml/m LA Biplane Vol: 60.6 ml 32.89 ml/m  AORTIC VALVE LVOT Vmax:   97.13 cm/s LVOT Vmean:  61.600 cm/s LVOT VTI:    0.178 m  AORTA Ao Root diam: 3.70 cm Ao Asc diam:  3.00 cm  SHUNTS Systemic VTI:  0.18 m Systemic Diam: 2.30 cm Loralie Champagne MD Electronically signed by Loralie Champagne MD Signature Date/Time: 04/12/2021/3:29:37 PM    Final    CT HEAD CODE STROKE WO CONTRAST  Result Date: 04/28/2021 CLINICAL DATA:  Code stroke. EXAM: CT HEAD WITHOUT CONTRAST TECHNIQUE: Contiguous axial images were obtained from the base of the skull through the vertex without intravenous contrast. COMPARISON:  None. FINDINGS: Brain: Acute parenchymal hemorrhage is present centered in the right basal ganglia region  with extension into the corona radiata superiorly and temporal lobe inferiorly. Mild surrounding edema. Regional mass effect is present with partial effacement of the right lateral ventricle. No substantial midline shift. No intraventricular extension. No hydrocephalus. Gray-white differentiation is preserved. 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 Additional patchy low-attenuation in the supratentorial white matter probably reflects chronic microvascular ischemic changes. Vascular: There is intracranial  atherosclerotic calcification at the skull base. Skull: Unremarkable. Sinuses/Orbits: Ethmoid dominant mucosal thickening. Orbits are unremarkable. Other: Mastoid air cells are clear. IMPRESSION: Acute parenchymal hemorrhage centered in the right basal ganglia region with mild edema and regional mass effect. No intraventricular extension. These results were communicated to Dr. Curly Shores at 6:11 pm on 04/20/2021 by text page via the [facility] messaging system. Electronically Signed   By: Macy Mis M.D.   On: 04/10/2021 18:15   [place] EKG SITE RITE  Result Date: 04/14/2021 If Site Rite image not attached, placement could not be confirmed due to current cardiac rhythm.  CT ANGIO NECK CODE STROKE  Result Date: 04/12/2021 CLINICAL DATA:  81 year old male code stroke presentation on 04/25/2021 with right lentiform intra-axial hemorrhage. Subsequent encounter. EXAM: CT ANGIOGRAPHY HEAD AND NECK TECHNIQUE: Multidetector CT imaging of the head and neck was performed using the standard protocol during bolus administration of intravenous contrast. Multiplanar CT image reconstructions and MIPs were obtained to evaluate the vascular anatomy. Carotid stenosis measurements (when applicable) are obtained utilizing NASCET criteria, using the distal internal carotid diameter as the denominator. CONTRAST:  29mL OMNIPAQUE IOHEXOL 350 MG/ML SOLN COMPARISON:  Head CT 04/16/2021. FINDINGS: CT HEAD Brain: Calcified atherosclerosis at the skull base. Hyperdense intra-axial hemorrhage centered at the right lentiform encompasses 62 by 28 x 49 mm (AP by transverse by CC) for an estimated blood volume of 43 mL, and has mildly extended since presentation (approximately 31 mL at that time). But surrounding edema and regional mass effect have not significantly changed. Partially effaced right lateral ventricle with no ventriculomegaly. No intraventricular or extra-axial extension of blood. No significant midline shift. Basilar cisterns remain  patent. Stable gray-white matter differentiation elsewhere. 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 Calvarium and skull base: Intact, negative. Paranasal sinuses: Continued paranasal sinus opacification maximal in the ethmoids. Tympanic cavities and mastoids remain clear. Orbits: Visualized orbits and scalp soft tissues are within normal limits. Negative noncontrast deep soft tissue spaces of the face. CTA NECK Skeleton: Right side TMJ degeneration. Widespread cervical spine degeneration. No acute or suspicious osseous lesion identified. Upper chest: Mild upper lobe lung scarring mostly on the left. Retained secretions, bubbly opacity in the visible trachea. No superior mediastinal lymphadenopathy. Other neck: Negative.  No neck mass or lymphadenopathy. Aortic arch: Extensive soft and calcified arch atherosclerosis. Three vessel arch configuration. Great vessel origins relatively spared. Right carotid system: Mild brachiocephalic artery and right CCA origin atherosclerosis without stenosis. Mildly tortuous right CCA. Mostly calcified plaque at the right carotid bifurcation and posterior right ICA bulb without stenosis. Mildly tortuous ICA to the skull base. Left carotid system: Minimal plaque at the left CCA origin without stenosis. Soft plaque in the medial vessel at the level of the larynx on series 5, image 131 without stenosis. Minor plaque at the left ICA origin without stenosis. Mildly tortuous left ICA to the skull base. Vertebral arteries: Soft and calcified plaque in the proximal right subclavian artery without stenosis. Right vertebral artery origin remains normal. Tortuous right V1 and proximal V2 segments. Patent right vertebral artery to the skull base without  significant plaque or stenosis. Soft and calcified plaque in the proximal left subclavian artery without stenosis. Mild involvement of the left vertebral artery origin without stenosis. Tortuous left V1 and proximal V2 segments.  Codominant left vertebral artery is patent to the skull base without significant plaque or stenosis. CTA HEAD Posterior circulation: Codominant distal vertebral arteries with mild calcified plaque mostly on the left. No significant stenosis. Patent PICA origins and vertebrobasilar junction. Patent basilar artery with mild irregularity but no significant stenosis. Patent SCA and right PCA origins. Fetal left PCA origin. Right posterior communicating artery diminutive or absent. Bilateral P2 segments are mildly irregular without stenosis. But there is bilateral P3 segment stenosis, mild to moderate involving the inferior division on the left (series 10, image 31) and moderate to severe involving the right P3 trunk on series 10, image 26. Anterior circulation: Both ICA siphons are patent. Abundant siphon calcified plaque on both sides but no significant stenosis on the left. There is moderate stenosis of the right supraclinoid ICA on series 7, image 93. Normal left posterior communicating artery. Patent carotid termini. MCA and ACA origins are within normal limits. No convincing CTA spot sign associated with the right lentiform hemorrhage. Mildly dominant and tortuous right ACA A1 segment. Mild left A1 irregularity without stenosis. 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 Otherwise the bilateral ACA branches are within normal limits. Left MCA M1 segment and bifurcation are patent without stenosis. Left MCA branches are within normal limits. Right MCA M1 segment and bifurcation are patent without stenosis. Right MCA branches are within normal limits. Venous sinuses: Early contrast timing but grossly patent. Anatomic variants: Fetal left PCA origin. Mildly dominant right ACA A1. Review of the MIP images confirms the above findings IMPRESSION: 1. Right lentiform intra-axial hemorrhage has slightly enlarged since presentation, estimated blood  volume now 43 mL (previously 31 mL). But there is no CTA spot sign, and mild regional edema and mass effect are stable. 2. 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 There is up to Moderate stenosis of the Right supraclinoid ICA due to calcified plaque. And there is Moderate To Severe bilateral PCA P3 segment stenosis. No significant Vertebrobasilar or cervical carotid stenosis. 3. Aortic Atherosclerosis (ICD10-I70.0). Electronically Signed   By: Genevie Ann M.D.   On: 04/12/2021 06:27      PHYSICAL EXAM  Temp:  [98.4 F (36.9 C)-100.9 F (38.3 C)] 100.2 F (37.9 C) (07/18 1200) Pulse Rate:  [50-91] 81 (07/18 1300) Resp:  [14-24] 16 (07/18 1300) BP: (95-165)/(54-85) 110/65 (07/18 1300) SpO2:  [92 %-97 %] 92 % (07/18 1514) FiO2 (%):  [40 %] 40 % (07/18 1514)  General -elderly Caucasian male, intubated on sedation.  Ophthalmologic - fundi not visualized due to noncooperation.  Cardiovascular - Regular rate and rhythm, not in afib today.  Neuro - intubated on sedation, eyes closed,  following commands. With forced eye opening, eyes in mid position, able to look to the left only to midline.  Right gaze deviation not blinking to visual threat, doll's eyes sluggish, not tracking, PERRL. Corneal reflex weakly present bilaterally, gag and cough present. Breathing over the vent.  Facial symmetry not able to test due to ET tube.  Tongue protrusion not cooperative. On pain stimulation, able to move right side well and trace movement in the left side.  We will follow only occasional commands on the right DTR diminished  and no babinski. Sensation, coordination and gait not tested.   ASSESSMENT/PLAN Richard Summers is a 80 y.o. male with history of hypertension, hyperlipidemia, MI status post CABG x3, OSA, seizure on Keppra admitted for left-sided weakness, left neglect, fall. No tPA given due to [place].    ICH:  right BG ICH likely hypertensive  etiology CT head right BG ICH CTA head and neck moderate stenosis of the right supraclinoid ICA, moderate to severe bilateral P3.  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 CT repeat showed slight increase of right BG hemorrhage, no CT spot sign, mild regional edema and mass-effect MRI stable right BG hematoma, minimal midline shift CT head repeat 7/15 large stable hematoma right BG, increased mass-effect with edema and mild midline shift 2D Echo EF 45% LDL 149 HgbA1c 6.2 SCDs for VTE prophylaxis No antithrombotic prior to admission, now on No antithrombotic due to [place] Ongoing aggressive stroke risk factor management Therapy recommendations: Pending Disposition: Pending  Cerebral edema CT head repeat showed slight increase of right BG ICH, mild regional edema and mass-effect Given the size of ICH, put on 3% saline Na goal 150-155 Na 137->141->145->151->154->161 3% saline @ 50->75->50-> off On FW MRI stable right BG hematoma, minimal midline shift  Respiratory failure ?  Aspiration Intubated overnight On vent and sedation CCM on board  New diagnosed paroxysmal A. fib EKG showed A. Fib  Telemetry A. Fib -> now sinus Rate controlled Not AC candidate  History of seizure On home Keppra XR 2000 daily Continue Keppra 1 g twice daily -> 500 bid for renal adjustment  AKI Urinary retention Creatinine 0.75-1.1-1.79-2.34-2.77-1.54 Could be post renal Foley catheter placed On FW CCM following  Hypertension & hypotension Stable on Cleviprex -> Neo  BP goal < 160 Avoid low BP CCM on board Long term BP goal normotensive  Hyperlipidemia Home meds: Lipitor 10 LDL 149, goal < 70 Hold off Lipitor for now Consider to resume statin at discharge  Dysphagia Was on dysphagia 1 with nectar thick liquid Not n.p.o. Speech on board Consider tube feeding when stable  Other Stroke Risk Factors Advanced age Coronary artery disease/MI status post CABG x3 Obstructive sleep apnea, on  CPAP at home  Other Active Problems MGUS Hypokalemia, K 3.2-> supplement ->3.6   Hospital day # 6 Plan continue weaning off ventilatory support as per critical care team and one-way extubation and no reintubation as per palliative care team's discussion with patient's wife who herself unfortunately is sick and is admitted to [facility] hospital.  Appreciate help from palliative care team to determine goals of care .  Continue strict blood pressure control with systolic below 259.  Wean off ventilatory support as per critical care team.  No family available at the bedside for discussion.  Discussed with Dr. Tacy Learn critical care team MD This patient is critically ill due to respiratory failure, right large BG ICH, aspiration, AKI, hypotension, cerebral edema new diagnosed A. fib and at significant risk of neurological worsening, death form brain herniation, hematoma expansion, renal failure, recurrent stroke, heart failure. This patient's care requires constant monitoring of vital signs, hemodynamics, respiratory and cardiac monitoring, review of multiple databases, neurological assessment, discussion with family, other specialists and medical decision making of high complexity. I spent 30 minutes of neurocritical care time in the care of this patient.  I discussed with CCM team I had long discussion with wife [name] over the phone, updated pt current condition, treatment plan and potential prognosis, and answered all the  questions.  She expressed understanding and appreciation.    Antony Contras, MD Stroke Neurology 04/17/2021 3:58 PM    To contact Stroke Continuity provider, please refer to [email or web address] After hours, contact General Neurology

## 2021-04-18 DIAGNOSIS — A419 Sepsis, unspecified organism: Secondary | ICD-10-CM

## 2021-04-18 DIAGNOSIS — T17908A Unspecified foreign body in respiratory tract, part unspecified causing other injury, initial encounter: Secondary | ICD-10-CM | POA: Diagnosis not present

## 2021-04-18 DIAGNOSIS — J9601 Acute respiratory failure with hypoxia: Secondary | ICD-10-CM | POA: Diagnosis not present

## 2021-04-18 DIAGNOSIS — R6521 Severe sepsis with septic shock: Secondary | ICD-10-CM

## 2021-04-18 DIAGNOSIS — I619 Nontraumatic intracerebral hemorrhage, unspecified: Secondary | ICD-10-CM | POA: Diagnosis not present

## 2021-04-18 DIAGNOSIS — I255 Ischemic cardiomyopathy: Secondary | ICD-10-CM | POA: Diagnosis not present

## 2021-04-18 LAB — CBC WITH DIFFERENTIAL/PLATELET
Abs Immature Granulocytes: 0.31 10*3/uL — ABNORMAL HIGH (ref 0.00–0.07)
Basophils Absolute: 0.1 10*3/uL (ref 0.0–0.1)
Basophils Relative: 1 %
Eosinophils Absolute: 0.3 10*3/uL (ref 0.0–0.5)
Eosinophils Relative: 3 %
HCT: 35.3 % — ABNORMAL LOW (ref 39.0–52.0)
Hemoglobin: 11.4 g/dL — ABNORMAL LOW (ref 13.0–17.0)
Immature Granulocytes: 3 %
Lymphocytes Relative: 8 %
Lymphs Abs: 0.7 10*3/uL (ref 0.7–4.0)
MCH: 31.6 pg (ref 26.0–34.0)
MCHC: 32.3 g/dL (ref 30.0–36.0)
MCV: 97.8 fL (ref 80.0–100.0)
Monocytes Absolute: 1 10*3/uL (ref 0.1–1.0)
Monocytes Relative: 11 %
Neutro Abs: 6.8 10*3/uL (ref 1.7–7.7)
Neutrophils Relative %: 74 %
Platelets: 82 10*3/uL — ABNORMAL LOW (ref 150–400)
RBC: 3.61 MIL/uL — ABNORMAL LOW (ref 4.22–5.81)
RDW: 13.3 % (ref 11.5–15.5)
WBC: 9.1 10*3/uL (ref 4.0–10.5)
nRBC: 0 % (ref 0.0–0.2)

## 2021-04-18 LAB — PROCALCITONIN: Procalcitonin: 4.04 ng/mL

## 2021-04-18 LAB — BASIC METABOLIC PANEL
Anion gap: 5 (ref 5–15)
BUN: 30 mg/dL — ABNORMAL HIGH (ref 8–23)
CO2: 31 mmol/L (ref 22–32)
Calcium: 8.2 mg/dL — ABNORMAL LOW (ref 8.9–10.3)
Chloride: 113 mmol/L — ABNORMAL HIGH (ref 98–111)
Creatinine, Ser: 0.78 mg/dL (ref 0.61–1.24)
GFR, Estimated: >60 mL/min (ref >60)
Glucose, Bld: 168 mg/dL — ABNORMAL HIGH (ref 70–99)
Potassium: 3.5 mmol/L (ref 3.5–5.1)
Sodium: 149 mmol/L — ABNORMAL HIGH (ref 135–145)

## 2021-04-18 LAB — GLUCOSE, CAPILLARY
Glucose-Capillary: 190 mg/dL — ABNORMAL HIGH (ref 70–99)
Glucose-Capillary: 174 mg/dL — ABNORMAL HIGH (ref 70–99)
Glucose-Capillary: 232 mg/dL — ABNORMAL HIGH (ref 70–99)
Glucose-Capillary: 266 mg/dL — ABNORMAL HIGH (ref 70–99)
Glucose-Capillary: 217 mg/dL — ABNORMAL HIGH (ref 70–99)
Glucose-Capillary: 139 mg/dL — ABNORMAL HIGH (ref 70–99)

## 2021-04-18 LAB — MAGNESIUM: Magnesium: 2 mg/dL (ref 1.7–2.4)

## 2021-04-18 MED ORDER — BETHANECHOL CHLORIDE 10 MG PO TABS
10.0000 mg | ORAL_TABLET | Freq: Four times a day (QID) | ORAL | Status: DC
  Administered 2021-04-18 – 2021-04-19 (×5): 10 mg
  Filled 2021-04-18 (×5): qty 1

## 2021-04-18 MED ORDER — LORAZEPAM 2 MG/ML IJ SOLN
1.0000 mg | INTRAMUSCULAR | Status: DC | PRN
Start: 2021-04-18 — End: 2021-04-19

## 2021-04-18 MED ORDER — FREE WATER
300.0000 mL | Freq: Four times a day (QID) | Status: DC
  Administered 2021-04-18: 300 mL

## 2021-04-18 MED ORDER — HALOPERIDOL LACTATE 5 MG/ML IJ SOLN: 0.5000 mg | INTRAMUSCULAR | Status: DC | PRN

## 2021-04-18 MED ORDER — ONDANSETRON HCL 4 MG/2ML IJ SOLN: 4.0000 mg | Freq: Four times a day (QID) | INTRAMUSCULAR | Status: DC | PRN

## 2021-04-18 MED ORDER — POTASSIUM CHLORIDE 20 MEQ PO PACK: 40.0000 meq | PACK | Freq: Once | ORAL | Status: DC

## 2021-04-18 MED ORDER — HALOPERIDOL 0.5 MG PO TABS
0.5000 mg | ORAL_TABLET | ORAL | Status: DC | PRN
  Filled 2021-04-18: qty 1

## 2021-04-18 MED ORDER — POLYVINYL ALCOHOL 1.4 % OP SOLN
1.0000 [drp] | Freq: Four times a day (QID) | OPHTHALMIC | Status: DC | PRN
  Filled 2021-04-18: qty 15

## 2021-04-18 MED ORDER — POTASSIUM CHLORIDE 20 MEQ PO PACK
40.0000 meq | PACK | Freq: Once | ORAL | Status: AC
  Administered 2021-04-18: 40 meq
  Filled 2021-04-18: qty 2

## 2021-04-18 MED ORDER — LORAZEPAM 2 MG/ML PO CONC: 1.0000 mg | ORAL | Status: DC | PRN

## 2021-04-18 MED ORDER — GLYCOPYRROLATE 0.2 MG/ML IJ SOLN
0.2000 mg | Freq: Four times a day (QID) | INTRAMUSCULAR | Status: DC
  Administered 2021-04-18 – 2021-04-19 (×3): 0.2 mg via INTRAVENOUS
  Filled 2021-04-18 (×3): qty 1

## 2021-04-18 MED ORDER — ONDANSETRON 4 MG PO TBDP: 4.0000 mg | ORAL_TABLET | Freq: Four times a day (QID) | ORAL | Status: DC | PRN

## 2021-04-18 MED ORDER — LORAZEPAM 1 MG PO TABS: 1.0000 mg | ORAL_TABLET | ORAL | Status: DC | PRN

## 2021-04-18 MED ORDER — HALOPERIDOL LACTATE 2 MG/ML PO CONC
0.5000 mg | ORAL | Status: DC | PRN
  Filled 2021-04-18: qty 0.3

## 2021-04-18 NOTE — Progress Notes (Signed)
PT Cancellation Note  Patient Details Name: Richard Summers MRN: [id number] DOB: 07-05-1941   Cancelled Treatment:    Reason Eval/Treat Not Completed: Medical issues which prohibited therapy.  Per Ander Purpura, RN, pt was fighting the vent earlier today and she gave fentanyl to help him relax.  She reports there are plans for one way extubation likely tomorrow.  They have arranged a family visit with his wife who is currently at [facility] and headed to SNF.  PT will check in tomorrow, but will hold today pending results of extubation attempt.    Thanks,  Verdene Lennert, PT, DPT  Acute Rehabilitation Ortho Tech Supervisor [phone] pager #[phone] office      Wells Guiles B Safiatou Islam 04/18/2021, 3:50 PM

## 2021-04-18 NOTE — Progress Notes (Signed)
NAME:  Richard Summers, MRN:  [id number], DOB:  05/22/1941, LOS: 7 ADMISSION DATE:  04/29/2021, CONSULTATION DATE:  7/14 REFERRING MD:  Dr. Cheral Marker, CHIEF COMPLAINT:  ICH   History of Present Illness:  Patient is encephalopathic and/or intubated. Therefore history has been obtained from chart review.   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  According to reports the patient's wife heard him fall while taking a shower and called EMS.  Upon their arrival he was unable to move his left side.  He was transported to [facility] emergency department as a code stroke and CT scan upon arrival demonstrated intercranial hemorrhage.  Cerebral edema also demonstrated on scan.  He was admitted to the neuro ICU for close monitoring and Cleviprex infusion for blood pressure goal less than 140 mmHg. He was started on hypertonic saline for worsening cerebral edema. 4/14 there was some concern for his airway. He was coughing after drinking nectar thick liquid and having upper airway secretions despite suctioning. When he became hypoxic requiring increased oxygen, PCCM was consulted.   Significant Hospital Events: Including procedures, antibiotic start and stop dates in addition to other pertinent events   7/12 admitted with [place] 7/14 intubated for airway protection given worsening hypoxia and witnessed aspiration; started on levaquin   7/15 tmax 101, HTS off, urinary retention/ sCr up -foley replaced, off cleviprex but hypotensive requiring vasopressors, repeated CTH, R PICC placed 7/16 no SBT given tachypnea at baseline, off HTS, neuro stable, tmax 100.6, improving sCr/ great UOP, off Neo, started on HTN meds  Interim History / Subjective:  Patient remained afebrile and off vasopressors Tolerating pressure support trial, awaiting patient's wife to come in for one-way  extubation  Objective   Blood pressure (!) 154/79, pulse 75, temperature 99 F (37.2 C), temperature source Axillary, resp. rate 20, height 5\' 6"  (1.676 m), weight 80 kg, SpO2 95 %.    Vent Mode: PSV;CPAP FiO2 (%):  [40 %] 40 % Set Rate:  [22 bmp] 22 bmp Vt Set:  [510 mL] 510 mL PEEP:  [5 cmH20] 5 cmH20 Pressure Support:  [5 cmH20] 5 cmH20 Plateau Pressure:  [14 cmH20-15 cmH20] 14 cmH20   Intake/Output Summary (Last 24 hours) at 04/18/2021 1139 Last data filed at 04/18/2021 0901 Gross per 24 hour  Intake 3196.89 ml  Output 2150 ml  Net 1046.89 ml   Filed Weights   04/13/2021 1821 04/16/21 0400 04/18/21 0449  Weight: 74.8 kg 79.7 kg 80 kg    Examination:   Physical exam: General: Acute on chronically ill-appearing elderly Caucasian male, orally intubated HEENT: [place]/AT, eyes anicteric.  ETT and OGT in place Neuro: Eyes open, following commands, moving the right side, plegic on left.  Right gaze deviation, does not cross midline Chest: Coarse breath sounds, no wheezes or rhonchi Heart: Irregularly irregular, no murmurs or gallops Abdomen: Soft, nontender, nondistended, bowel sounds present Skin: No rash   Resolved Hospital Problem list   Septic shock Postobstructive acute kidney injury Non-anion gap metabolic acidosis  Assessment & Plan:  Right-sided basal ganglia intraparenchymal hemorrhage likely due to hypertension Cerebral edema, improved Induced hypernatremia with hypertonic saline Continue secondary stroke prophylaxis SBP goal <160 Continue secondary stroke prophylaxis Patient is off hypertonic saline Serum sodium is trending down now it is 149 Decrease free water to 300 mL every 6 hours Hold antiplatelet therapy due to intraparenchymal hemorrhage  Acute hypoxemic respiratory  failure secondary to aspiration pneumonia with gram-positive cocci in pairs and chains Continue lung protective ventilation Patient is tolerating spontaneous breathing trial, watch for  respiratory distress VAP prevention protocol/ PPI I suspect given his deficits and weakness, he will be high risk for reintubation/ aspiration or fail extubation trial.   Awaiting patient's wife to come to proceed with one-way extubation, patient would not like to be reintubated or having tracheostomy Continue Levaquin  Newly diagnosed persistent atrial fibrillation Heart rate is well controlled Not a candidate for [facility] at this time given ICH  Continue as needed metoprolol  Seizure disorder Continue home Keppra Continue seizure precautions  Hypokalemia Hypophosphatemia Continue aggressive electrolyte supplements  Acute urinary retention Patient developed AKI due to acute urinary retention Continue Foley catheter Started on bethanechol, will give trial of void tomorrow  Chronic systolic congestive heart failure Patient looks euvolemic Monitor intake and output  Best Practice (right click and "Reselect all SmartList Selections" daily)   Diet/type: NPO; continue TF DVT prophylaxis: SCD GI prophylaxis: PPI Lines: PICC line Foley:  Yes, and it is still needed; inserted 7/15 2/2 urinary rentention/ AKI Code Status: DNR Last date of multidisciplinary goals of care discussion per primary; wife, [name] [phone], is currently hospitalized at [facility], updated on 7/17  Labs   CBC: Recent Labs  Lab 04/20/2021 1756 04/08/2021 1804 04/14/21 0409 04/14/21 0518 04/15/21 0404 04/16/21 0553 04/17/21 0527 04/18/21 0434  WBC 7.5   < > 5.0  --  10.6* 9.0 10.7* 9.1  NEUTROABS 6.2  --   --   --   --   --   --  6.8  HGB 16.0   < > 16.1 14.6 13.7 11.9* 12.0* 11.4*  HCT 47.4   < > 49.2 43.0 39.9 36.7* 36.3* 35.3*  MCV 93.3   < > 98.0  --  94.5 97.9 96.0 97.8  PLT 160   < > 146*  --  77* 69* 68* 82*   < > = values in this interval not displayed.    Basic Metabolic Panel: Recent Labs  Lab 04/15/21 0404 04/15/21 0411 04/15/21 1728 04/15/21 1948 04/16/21 0553 04/17/21 0527 04/18/21 0434   NA 161*  --   --  160* 160* 156* 149*  K 2.9*  --   --  3.1* 3.8 3.6 3.5  CL >130*  --   --  >130* >130* 121* 113*  CO2 22  --   --  25 28 30 31   GLUCOSE 116*  --   --  183* 187* 133* 168*  BUN 39*  --   --  37* 36* 32* 30*  CREATININE 1.54*  --   --  1.22 1.10 0.85 0.78  CALCIUM 7.9*  --   --  7.9* 8.0* 8.0* 8.2*  MG 1.9 1.8 2.4  --  2.1 1.9 2.0  PHOS 2.6 2.4* 1.4*  --  1.4* 2.8  --    GFR: Estimated Creatinine Clearance: 74.4 mL/min (by C-G formula based on SCr of 0.78 mg/dL). Recent Labs  Lab 04/15/21 0404 04/16/21 0553 04/17/21 0527 04/18/21 0434  PROCALCITON  --  13.93 7.42 4.04  WBC 10.6* 9.0 10.7* 9.1    Liver Function Tests: Recent Labs  Lab 04/20/2021 1756 04/17/21 0527  AST 22  --   ALT 20  --   ALKPHOS 59  --   BILITOT 1.8*  --   PROT 7.0  --   ALBUMIN 4.1 2.0*   No results for input(s): LIPASE, AMYLASE in the  last 168 hours. No results for input(s): AMMONIA in the last 168 hours.  ABG    Component Value Date/Time   PHART 7.259 (L) 04/14/2021 0518   PCO2ART 39.8 04/14/2021 0518   PO2ART 90 04/14/2021 0518   HCO3 17.8 (L) 04/14/2021 0518   TCO2 19 (L) 04/14/2021 0518   ACIDBASEDEF 9.0 (H) 04/14/2021 0518   O2SAT 96.0 04/14/2021 0518     Coagulation Profile: Recent Labs  Lab 04/01/2021 1756  INR 1.0    Cardiac Enzymes: No results for input(s): CKTOTAL, CKMB, CKMBINDEX, TROPONINI in the last 168 hours.  HbA1C: Hgb A1c MFr Bld  Date/Time Value Ref Range Status  04/13/2021 02:38 AM 6.2 (H) 4.8 - 5.6 % Final    Comment:    (NOTE) Pre diabetes:          5.7%-6.4%  Diabetes:              >6.4%  Glycemic control for   <7.0% adults with diabetes     CBG: Recent Labs  Lab 04/17/21 1921 04/17/21 2321 04/18/21 0330 04/18/21 0742 04/18/21 1124  GLUCAP 235* 222* 190* 174* 232*   Total critical care time: 41 minutes  Performed by: [place] care time was exclusive of separately billable procedures and treating other  patients.   Critical care was necessary to treat or prevent imminent or life-threatening deterioration.   Critical care was time spent personally by me on the following activities: development of treatment plan with patient and/or surrogate as well as nursing, discussions with consultants, evaluation of patient's response to treatment, examination of patient, obtaining history from patient or surrogate, ordering and performing treatments and interventions, ordering and review of laboratory studies, ordering and review of radiographic studies, pulse oximetry and re-evaluation of patient's condition.   Jacky Kindle MD [facility] See Amion for pager If no response to pager, please call [phone] until 7pm After 7pm, Please call E-link [phone]

## 2021-04-18 NOTE — Progress Notes (Addendum)
STROKE TEAM PROGRESS NOTE   SUBJECTIVE (INTERVAL HISTORY)  No acute events overnight, continues to tolerate pressure support trial. Waiting on patients wife to come in for one way extubation.   Na 149 today.  Vital signs stable.  Unable to reach patient's wife and she herself is admitted to the hospital in is likely being transferred to skilled nursing facility  Neurological examination is stable, following commands with right hand to show two fingers, give thumbs up, LUE and LLE are plegic.   OBJECTIVE Temp:  [98.8 F (37.1 C)-100.4 F (38 C)] 99.2 F (37.3 C) (07/19 1159) Pulse Rate:  [63-105] 72 (07/19 1500) Cardiac Rhythm: Normal sinus rhythm;Sinus tachycardia (07/19 0800) Resp:  [13-25] 22 (07/19 1500) BP: (99-176)/(55-94) 120/72 (07/19 1500) SpO2:  [92 %-98 %] 92 % (07/19 1500) FiO2 (%):  [40 %] 40 % (07/19 1108) Weight:  [80 kg] 80 kg (07/19 0449)  Recent Labs  Lab 04/17/21 2321 04/18/21 0330 04/18/21 0742 04/18/21 1124 04/18/21 1539  GLUCAP 222* 190* 174* 232* 266*   Recent Labs  Lab 04/15/21 0404 04/15/21 0411 04/15/21 1728 04/15/21 1948 04/16/21 0553 04/17/21 0527 04/18/21 0434  NA 161*  --   --  160* 160* 156* 149*  K 2.9*  --   --  3.1* 3.8 3.6 3.5  CL >130*  --   --  >130* >130* 121* 113*  CO2 22  --   --  25 28 30 31   GLUCOSE 116*  --   --  183* 187* 133* 168*  BUN 39*  --   --  37* 36* 32* 30*  CREATININE 1.54*  --   --  1.22 1.10 0.85 0.78  CALCIUM 7.9*  --   --  7.9* 8.0* 8.0* 8.2*  MG 1.9 1.8 2.4  --  2.1 1.9 2.0  PHOS 2.6 2.4* 1.4*  --  1.4* 2.8  --    Recent Labs  Lab 04/01/2021 1756 04/17/21 0527  AST 22  --   ALT 20  --   ALKPHOS 59  --   BILITOT 1.8*  --   PROT 7.0  --   ALBUMIN 4.1 2.0*   Recent Labs  Lab 03/31/2021 1756 04/07/2021 1804 04/14/21 0409 04/14/21 0518 04/15/21 0404 04/16/21 0553 04/17/21 0527 04/18/21 0434  WBC 7.5   < > 5.0  --  10.6* 9.0 10.7* 9.1  NEUTROABS 6.2  --   --   --   --   --   --  6.8  HGB 16.0   <  > 16.1 14.6 13.7 11.9* 12.0* 11.4*  HCT 47.4   < > 49.2 43.0 39.9 36.7* 36.3* 35.3*  MCV 93.3   < > 98.0  --  94.5 97.9 96.0 97.8  PLT 160   < > 146*  --  77* 69* 68* 82*   < > = values in this interval not displayed.   No results for input(s): CKTOTAL, CKMB, CKMBINDEX, TROPONINI in the last 168 hours. No results for input(s): LABPROT, INR in the last 72 hours.  No results for input(s): COLORURINE, LABSPEC, [place], GLUCOSEU, HGBUR, BILIRUBINUR, KETONESUR, PROTEINUR, UROBILINOGEN, NITRITE, LEUKOCYTESUR in the last 72 hours.  Invalid input(s): APPERANCEUR      Component Value Date/Time   CHOL 239 (H) 04/13/2021 0238   TRIG 62 04/14/2021 0409   HDL 72 04/13/2021 0238   CHOLHDL 3.3 04/13/2021 0238   VLDL 18 04/13/2021 0238   LDLCALC 149 (H) 04/13/2021 0238   Lab Results  Component Value  Date   HGBA1C 6.2 (H) 04/13/2021      Component Value Date/Time   LABOPIA NONE DETECTED 04/12/2021 0107   COCAINSCRNUR NONE DETECTED 04/12/2021 0107   LABBENZ NONE DETECTED 04/12/2021 0107   AMPHETMU NONE DETECTED 04/12/2021 0107   THCU NONE DETECTED 04/12/2021 0107   LABBARB NONE DETECTED 04/12/2021 0107    No results for input(s): ETH in the last 168 hours.  I have personally reviewed the radiological images below and agree with the radiology interpretations.  CT ANGIO HEAD W OR WO CONTRAST  Result Date: 04/12/2021 CLINICAL DATA:  80 year old male code stroke presentation on 04/23/2021 with right lentiform intra-axial hemorrhage. Subsequent encounter. EXAM: CT ANGIOGRAPHY HEAD AND NECK TECHNIQUE: Multidetector CT imaging of the head and neck was performed using the standard protocol during bolus administration of intravenous contrast. Multiplanar CT image reconstructions and MIPs were obtained to evaluate the vascular anatomy. Carotid stenosis measurements (when applicable) are obtained utilizing NASCET criteria, using the distal internal carotid diameter as the denominator. CONTRAST:  90mL  OMNIPAQUE IOHEXOL 350 MG/ML SOLN COMPARISON:  Head CT 04/08/2021. FINDINGS: CT HEAD Brain: Calcified atherosclerosis at the skull base. Hyperdense intra-axial hemorrhage centered at the right lentiform encompasses 62 by 28 x 49 mm (AP by transverse by CC) for an estimated blood volume of 43 mL, and has mildly extended since presentation (approximately 31 mL at that time). But surrounding edema and regional mass effect have not significantly changed. Partially effaced right lateral ventricle with no ventriculomegaly. No intraventricular or extra-axial extension of blood. No significant midline shift. Basilar cisterns remain patent. Stable gray-white matter differentiation elsewhere. 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 Calvarium and skull base: Intact, negative. Paranasal sinuses: Continued paranasal sinus opacification maximal in the ethmoids. Tympanic cavities and mastoids remain clear. Orbits: Visualized orbits and scalp soft tissues are within normal limits. Negative noncontrast deep soft tissue spaces of the face. CTA NECK Skeleton: Right side TMJ degeneration. Widespread cervical spine degeneration. No acute or suspicious osseous lesion identified. Upper chest: Mild upper lobe lung scarring mostly on the left. Retained secretions, bubbly opacity in the visible trachea. No superior mediastinal lymphadenopathy. Other neck: Negative.  No neck mass or lymphadenopathy. Aortic arch: Extensive soft and calcified arch atherosclerosis. Three vessel arch configuration. Great vessel origins relatively spared. Right carotid system: Mild brachiocephalic artery and right CCA origin atherosclerosis without stenosis. Mildly tortuous right CCA. Mostly calcified plaque at the right carotid bifurcation and posterior right ICA bulb without stenosis. Mildly tortuous ICA to the skull base. Left carotid system: Minimal plaque at the left CCA origin without stenosis. Soft plaque in the medial vessel at the level of the  larynx on series 5, image 131 without stenosis. Minor plaque at the left ICA origin without stenosis. Mildly tortuous left ICA to the skull base. Vertebral arteries: Soft and calcified plaque in the proximal right subclavian artery without stenosis. Right vertebral artery origin remains normal. Tortuous right V1 and proximal V2 segments. Patent right vertebral artery to the skull base without significant plaque or stenosis. Soft and calcified plaque in the proximal left subclavian artery without stenosis. Mild involvement of the left vertebral artery origin without stenosis. Tortuous left V1 and proximal V2 segments. Codominant left vertebral artery is patent to the skull base without significant plaque or stenosis. CTA HEAD Posterior circulation: Codominant distal vertebral arteries with mild calcified plaque mostly on the left. No significant stenosis. Patent PICA origins and vertebrobasilar junction. Patent basilar artery with mild irregularity but no significant stenosis.  Patent SCA and right PCA origins. Fetal left PCA origin. Right posterior communicating artery diminutive or absent. Bilateral P2 segments are mildly irregular without stenosis. But there is bilateral P3 segment stenosis, mild to moderate involving the inferior division on the left (series 10, image 31) and moderate to severe involving the right P3 trunk on series 10, image 26. Anterior circulation: Both ICA siphons are patent. Abundant siphon calcified plaque on both sides but no significant stenosis on the left. There is moderate stenosis of the right supraclinoid ICA on series 7, image 93. Normal left posterior communicating artery. Patent carotid termini. MCA and ACA origins are within normal limits. No convincing CTA spot sign associated with the right lentiform hemorrhage. Mildly dominant and tortuous right ACA A1 segment. Mild left A1 irregularity without stenosis. 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 Otherwise the bilateral ACA branches are within normal limits. Left MCA M1 segment and bifurcation are patent without stenosis. Left MCA branches are within normal limits. Right MCA M1 segment and bifurcation are patent without stenosis. Right MCA branches are within normal limits. Venous sinuses: Early contrast timing but grossly patent. Anatomic variants: Fetal left PCA origin. Mildly dominant right ACA A1. Review of the MIP images confirms the above findings IMPRESSION: 1. Right lentiform intra-axial hemorrhage has slightly enlarged since presentation, estimated blood volume now 43 mL (previously 31 mL). But there is no CTA spot sign, and mild regional edema and mass effect are stable. 2. 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 There is up to Moderate stenosis of the Right supraclinoid ICA due to calcified plaque. And there is Moderate To Severe bilateral PCA P3 segment stenosis. No significant Vertebrobasilar or cervical carotid stenosis. 3. Aortic Atherosclerosis (ICD10-I70.0). Electronically Signed   By: Genevie Ann M.D.   On: 04/12/2021 06:27   CT HEAD WO CONTRAST  Result Date: 04/14/2021 CLINICAL DATA:  Intracranial hemorrhage follow-up EXAM: CT HEAD WITHOUT CONTRAST TECHNIQUE: Contiguous axial images were obtained from the base of the skull through the vertex without intravenous contrast. COMPARISON:  CT head 04/08/2021.  MRI head 04/13/2021 FINDINGS: Brain: High-density acute hemorrhage in the right lateral basal ganglia measures approximately 5.7 x 2.5 x 3.6 cm. Hematoma size is stable. There is surrounding low-density edema. There is progressive mass-effect on the right lateral ventricle. Mild midline shift to the left slightly increased. Chronic lacunar infarct in the head of caudate on the left and in the left subinsular white matter, unchanged. No acute ischemic infarct or mass identified.  Vascular: Negative for hyperdense vessel Skull: Negative Sinuses/Orbits: Mucosal  edema paranasal sinuses.  Negative orbit Other: None IMPRESSION: Large hematoma right external capsule unchanged in size. There is increased mass-effect and edema with mild midline shift to the left. Electronically Signed   By: Franchot Gallo M.D.   On: 04/14/2021 14:52   MR BRAIN WO CONTRAST  Result Date: 04/13/2021 CLINICAL DATA:  80 year old male code stroke presentation on 04/15/2021 with right lentiform intra-axial hemorrhage. EXAM: MRI HEAD WITHOUT CONTRAST TECHNIQUE: Multiplanar, multiecho pulse sequences of the brain and surrounding structures were obtained without intravenous contrast. COMPARISON:  CTA head and neck 04/12/2021 and earlier. FINDINGS: Brain: Mix signal intra-axial hemorrhage centered at the right lentiform encompasses 68 by 26 by 45 mm (AP by transverse by CC) for an estimated blood volume a 40 mL, stable since 04/12/2021. Surrounding edema. Susceptibility artifact along the margins of  the hematoma on DWI. No larger area of restricted diffusion surrounding the blood. No restricted diffusion elsewhere. Stable mass effect on the right lateral ventricle. No intraventricular extension. No extra-axial extension. Mild leftward midline shift of 3-4 mm is stable. No ventriculomegaly. Basilar cisterns remain patent. No chronic cerebral blood products elsewhere on T2* imaging. 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 No cortical encephalomalacia. Brainstem and cerebellum appear negative. Cervicomedullary junction and pituitary are within normal limits. Vascular: Major intracranial vascular flow voids are preserved. Skull and upper cervical spine: Negative visible cervical spine. Visualized bone marrow signal is within normal limits. Sinuses/Orbits: Negative orbits. Moderate  ethmoid and sphenoid sinus mucosal thickening. Mild maxillary sinus involvement. Other: Mastoids are clear. Visible internal auditory structures appear normal. Negative visible scalp and face. IMPRESSION: 1. Right lentiform intra-axial hemorrhage with estimated blood volume 40 mL is stable since yesterday. Susceptibility along the margins on DWI with no larger area of infarction. Stable intracranial mass effect with mild leftward midline shift. 2. No underlying chronic cerebral blood products, but there are contralateral chronic lacunar infarcts in the left basal ganglia. Electronically Signed   By: Genevie Ann M.D.   On: 04/13/2021 11:50   DG CHEST PORT 1 VIEW  Result Date: 04/15/2021 CLINICAL DATA:  Ventilator dependent.  Evaluate endotracheal tube. EXAM: PORTABLE CHEST 1 VIEW COMPARISON:  04/13/2021 FINDINGS: Endotracheal tube is in place with tip 3.8 centimeters above the carina. Nasogastric tube is in place with tip beyond the edge of the image, at least to the level of the proximal stomach. RIGHT-sided PICC line tip overlies the level of the superior vena cava. Heart size is accentuated by portable technique. There has been interval development of patchy infiltrates in the LOWER lobes bilaterally, consistent with infectious infiltrates. No evidence for pulmonary edema. IMPRESSION: Interval development of bilateral LOWER lobe infiltrates. Electronically Signed   By: Nolon Nations M.D.   On: 04/15/2021 13:26   DG CHEST PORT 1 VIEW  Result Date: 04/13/2021 CLINICAL DATA:  Check endotracheal tube placement EXAM: PORTABLE CHEST 1 VIEW COMPARISON:  04/12/2021 FINDINGS: Endotracheal tube is noted in place 16 mm above the carina. Gastric catheter is noted coiled within the stomach. Cardiac shadow is stable. The lungs are well aerated bilaterally. Mild central vascular congestion is seen without edema. IMPRESSION: Endotracheal tube in satisfactory position. Gastric catheter within the stomach. Mild vascular  congestion without interstitial edema. Electronically Signed   By: Inez Catalina M.D.   On: 04/13/2021 21:55   DG CHEST PORT 1 VIEW  Result Date: 04/12/2021 CLINICAL DATA:  80 year old male with intracranial hemorrhage. Hypoxia. EXAM: PORTABLE CHEST 1 VIEW COMPARISON:  Chest radiographs 08/28/2006. CTA neck today reported separately. FINDINGS: Portable AP semi upright view at 0415 hours. Mildly lower lung volumes compared to 2007. Normal cardiac size and mediastinal contours. Visualized tracheal air column is within normal limits. Allowing for portable technique the lungs are clear. No pneumothorax or pleural effusion. Chronic left anterior rib cerclage wires are stable. Paucity of bowel gas in the upper abdomen. IMPRESSION: No acute cardiopulmonary abnormality. Electronically Signed   By: Genevie Ann M.D.   On: 04/12/2021 06:29   DG Swallowing Func-Speech Pathology  Result Date: 04/12/2021 Formatting of this result is different from the original. Objective Swallowing Evaluation: Type of Study: MBS-Modified Barium Swallow Study  Patient Details Name: Richard Summers MRN: [id number] Date of Birth: September 03, 1941 Today's  Date: 04/12/2021 Time: SLP Start Time (ACUTE ONLY): 41 -SLP Stop Time (ACUTE ONLY): 1323 SLP Time Calculation (min) (ACUTE ONLY): 16 min Past Medical History: Past Medical History: Diagnosis Date  Allergic rhinitis   Allergy   Barrett's esophagus   Cataract cortical, senile   Coronary artery disease involving coronary bypass graft of native heart   Epilepsy (HCC)   GERD (gastroesophageal reflux disease)   Glaucoma   Hyperlipidemia   Hypertension   Insomnia   MGUS (monoclonal gammopathy of unknown significance)   Myocardial infarction ([place])   Nephrolithiasis   Osteoporosis   S/P triple vessel bypass   Sleep apnea   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 Plates and screws installed  TONSILLECTOMY    triple bypass    VASECTOMY   HPI: 80 yr old man  with history of epilepsy, Barrett's esophagus, GERD, HTN, MI s/p bypass, and OSA who had a fall in the shower with sudden left sided  hemiplegia with a fixed rt gaze. CT revealed right Basal Ganglia hemorrhage. CT repeated am 7/13 with right lentiform intra-axial hemorrhage has slightly enlarged since presentation.  No data recorded Assessment / Plan / Recommendation CHL IP CLINICAL IMPRESSIONS 04/12/2021 Clinical Impression Oral phase of swallow marked by decreased cohesion, ROM and manipulation resulted in anterior spill, left lateral sulci residue, lingual residue, holding and transit delays. Lingual residue spilled to valleculae and cleared with cues and spontaneous swallows. Pharyngeal and laryngeal mobility and strength were adequate once swallow was initiated. Poor timing to close larynx occured with thin liquid barium resulting in incomplete laryngeal closure and aspiration with reflexive cough (PAS 7) unable to clear trachea. Pt was very restless, pushing to his left, distracted and therapist unable to attempt compensatory strategies during study. Recommend Dys 1 (puree), nectar thick liquids, crush pills, check left buccal cavity for pocketing, limit distractions, straws allowed and full supervision and assist. SLP Visit Diagnosis Dysphagia, oropharyngeal phase (R13.12) Attention and concentration deficit following -- Frontal lobe and executive function deficit following -- Impact on safety and function Moderate aspiration risk;Mild aspiration risk   CHL IP TREATMENT RECOMMENDATION 04/12/2021 Treatment Recommendations Therapy as outlined in treatment plan below   Prognosis 04/12/2021 Prognosis for Safe Diet Advancement Good Barriers to Reach Goals Cognitive deficits Barriers/Prognosis Comment -- CHL IP DIET RECOMMENDATION 04/12/2021 SLP Diet  Recommendations Dysphagia 1 (Puree) solids;Nectar thick liquid Liquid Administration via Straw;Cup Medication Administration Crushed with puree Compensations Minimize environmental distractions;Slow rate;Small sips/bites;Lingual sweep for clearance of pocketing;Monitor for anterior loss Postural Changes Seated upright at 90 degrees   CHL IP OTHER RECOMMENDATIONS 04/12/2021 Recommended Consults -- Oral Care Recommendations Oral care BID Other Recommendations --   CHL IP FOLLOW UP RECOMMENDATIONS 04/12/2021 Follow up Recommendations Inpatient Rehab   CHL IP FREQUENCY AND DURATION 04/12/2021 Speech Therapy Frequency (ACUTE ONLY) min 2x/week Treatment Duration 2 weeks      CHL IP ORAL PHASE 04/12/2021 Oral Phase Impaired Oral - Pudding Teaspoon -- Oral - Pudding Cup -- Oral - Honey Teaspoon -- Oral - Honey Cup -- Oral - Nectar Teaspoon Left anterior bolus loss;Reduced posterior propulsion;Decreased bolus cohesion;Left pocketing in lateral sulci;Lingual/palatal residue Oral - Nectar Cup Left anterior bolus loss;Reduced posterior propulsion;Decreased bolus cohesion;Left pocketing in lateral sulci;Lingual/palatal residue  Oral - Nectar Straw Reduced posterior propulsion;Delayed oral transit;Lingual/palatal residue Oral - Thin Teaspoon Left anterior bolus loss;Reduced posterior propulsion;Decreased bolus cohesion;Holding of bolus Oral - Thin Cup Left anterior bolus loss;Reduced posterior propulsion;Decreased bolus cohesion;Holding of bolus Oral - Thin Straw -- Oral - Puree Holding of bolus;Lingual/palatal residue;Delayed oral transit Oral - Mech Soft -- Oral - Regular Left pocketing in lateral sulci;Delayed oral transit Oral - Multi-Consistency -- Oral - Pill -- Oral Phase - Comment --  CHL IP PHARYNGEAL PHASE 04/12/2021 Pharyngeal Phase Impaired Pharyngeal- Pudding Teaspoon -- Pharyngeal -- Pharyngeal- Pudding Cup -- Pharyngeal -- Pharyngeal- Honey Teaspoon -- Pharyngeal -- Pharyngeal- Honey Cup -- Pharyngeal -- Pharyngeal-  Nectar Teaspoon WFL Pharyngeal -- Pharyngeal- Nectar Cup Pharyngeal residue - valleculae Pharyngeal -- Pharyngeal- Nectar Straw WFL Pharyngeal -- Pharyngeal- Thin Teaspoon WFL Pharyngeal -- Pharyngeal- Thin Cup Penetration/Aspiration during swallow Pharyngeal Material enters airway, passes BELOW cords and not ejected out despite cough attempt by patient Pharyngeal- Thin Straw -- Pharyngeal -- Pharyngeal- Puree -- Pharyngeal -- Pharyngeal- Mechanical Soft -- Pharyngeal -- Pharyngeal- Regular -- Pharyngeal -- Pharyngeal- Multi-consistency -- Pharyngeal -- Pharyngeal- Pill -- Pharyngeal -- Pharyngeal Comment --  No flowsheet data found. [place] Siren 04/12/2021, 2:31 PM              ECHOCARDIOGRAM COMPLETE  Result Date: 04/12/2021    ECHOCARDIOGRAM REPORT   Patient Name:   Richard Summers Date of Exam: 04/12/2021 Medical Rec #:  [id number]      Height:       66.0 in Accession #:    [id number]     Weight:       164.9 lb Date of Birth:  1941-04-25      BSA:          1.842 m Patient Age:    80 years       BP:           146/75 mmHg Patient Gender: M              HR:           80 bpm. Exam Location:  Inpatient Procedure: 2D Echo, Cardiac Doppler and Color Doppler Indications:    Stroke I63.9  History:        Patient has no prior history of Echocardiogram examinations. CAD                 and Previous Myocardial Infarction; Risk Factors:Dyslipidemia                 and Hypertension.  Sonographer:    Bernadene Person RDCS Referring Phys: [id number] [place]  1. Left ventricular ejection fraction, by estimation, is 45%. The left ventricle demonstrates regional wall motion abnormalities with basal inferior akinesis, basal to mid inferolateral akinesis, basal anterolateral hypokinesis. There is mild left ventricular hypertrophy. Left ventricular diastolic parameters are consistent with Grade I diastolic dysfunction (impaired relaxation).  2. Right ventricular systolic function is normal. The right  ventricular size is normal. Tricuspid regurgitation signal is inadequate for assessing PA pressure.  3. Left atrial size was mildly dilated.  4. The mitral valve is normal in structure. Trivial mitral valve regurgitation. No evidence of mitral stenosis.  5. The aortic valve is tricuspid. Aortic valve regurgitation is not visualized. Mild aortic valve sclerosis is present, with no evidence of aortic valve stenosis. FINDINGS  Left Ventricle: Left ventricular ejection fraction, by estimation, is 45%. The left ventricle has mildly decreased function. The left ventricle demonstrates regional  wall motion abnormalities. The left ventricular internal cavity size was normal in size. There is mild left ventricular hypertrophy. Left ventricular diastolic parameters are consistent with Grade I diastolic dysfunction (impaired relaxation). Right Ventricle: The right ventricular size is normal. No increase in right ventricular wall thickness. Right ventricular systolic function is normal. Tricuspid regurgitation signal is inadequate for assessing PA pressure. Left Atrium: Left atrial size was mildly dilated. Right Atrium: Right atrial size was normal in size. Pericardium: There is no evidence of pericardial effusion. Mitral Valve: The mitral valve is normal in structure. Mild mitral annular calcification. Trivial mitral valve regurgitation. No evidence of mitral valve stenosis. Tricuspid Valve: The tricuspid valve is normal in structure. Tricuspid valve regurgitation is not demonstrated. Aortic Valve: The aortic valve is tricuspid. Aortic valve regurgitation is not visualized. Mild aortic valve sclerosis is present, with no evidence of aortic valve stenosis. Pulmonic Valve: The pulmonic valve was normal in structure. Pulmonic valve regurgitation is not visualized. Aorta: The aortic root is normal in size and structure. Venous: The inferior vena cava was not well visualized. IAS/Shunts: No atrial level shunt detected by color flow  Doppler.  LEFT VENTRICLE PLAX 2D LVIDd:         3.90 cm LVIDs:         2.70 cm LV PW:         1.10 cm LV IVS:        1.00 cm LVOT diam:     2.30 cm LV SV:         74 LV SV Index:   40 LVOT Area:     4.15 cm  RIGHT VENTRICLE RV S prime:     12.50 cm/s TAPSE (M-mode): 1.7 cm LEFT ATRIUM             Index       RIGHT ATRIUM           Index LA diam:        5.00 cm 2.71 cm/m  RA Area:     17.70 cm LA Vol (A2C):   55.0 ml 29.85 ml/m RA Volume:   51.20 ml  27.79 ml/m LA Vol (A4C):   59.6 ml 32.35 ml/m LA Biplane Vol: 60.6 ml 32.89 ml/m  AORTIC VALVE LVOT Vmax:   97.13 cm/s LVOT Vmean:  61.600 cm/s LVOT VTI:    0.178 m  AORTA Ao Root diam: 3.70 cm Ao Asc diam:  3.00 cm  SHUNTS Systemic VTI:  0.18 m Systemic Diam: 2.30 cm Loralie Champagne MD Electronically signed by Loralie Champagne MD Signature Date/Time: 04/12/2021/3:29:37 PM    Final    CT HEAD CODE STROKE WO CONTRAST  Result Date: 04/08/2021 CLINICAL DATA:  Code stroke. EXAM: CT HEAD WITHOUT CONTRAST TECHNIQUE: Contiguous axial images were obtained from the base of the skull through the vertex without intravenous contrast. COMPARISON:  None. FINDINGS: Brain: Acute parenchymal hemorrhage is present centered in the right basal ganglia region with extension into the corona radiata superiorly and temporal lobe inferiorly. Mild surrounding edema. Regional mass effect is present with partial effacement of the right lateral ventricle. No substantial midline shift. No intraventricular extension. No hydrocephalus. Gray-white differentiation is preserved. 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 Additional patchy low-attenuation in the supratentorial white matter probably reflects chronic microvascular ischemic changes. Vascular: There is intracranial atherosclerotic calcification at the skull base. Skull: Unremarkable. Sinuses/Orbits: Ethmoid dominant mucosal thickening. Orbits are unremarkable. Other: Mastoid air cells are clear. IMPRESSION: Acute parenchymal  hemorrhage centered  in the right basal ganglia region with mild edema and regional mass effect. No intraventricular extension. These results were communicated to Dr. Curly Shores at 6:11 pm on 04/30/2021 by text page via the [facility] messaging system. Electronically Signed   By: Macy Mis M.D.   On: 04/08/2021 18:15   [place] EKG SITE RITE  Result Date: 04/14/2021 If Site Rite image not attached, placement could not be confirmed due to current cardiac rhythm.  CT ANGIO NECK CODE STROKE  Result Date: 04/12/2021 CLINICAL DATA:  80 year old male code stroke presentation on 04/27/2021 with right lentiform intra-axial hemorrhage. Subsequent encounter. EXAM: CT ANGIOGRAPHY HEAD AND NECK TECHNIQUE: Multidetector CT imaging of the head and neck was performed using the standard protocol during bolus administration of intravenous contrast. Multiplanar CT image reconstructions and MIPs were obtained to evaluate the vascular anatomy. Carotid stenosis measurements (when applicable) are obtained utilizing NASCET criteria, using the distal internal carotid diameter as the denominator. CONTRAST:  10mL OMNIPAQUE IOHEXOL 350 MG/ML SOLN COMPARISON:  Head CT 04/03/2021. FINDINGS: CT HEAD Brain: Calcified atherosclerosis at the skull base. Hyperdense intra-axial hemorrhage centered at the right lentiform encompasses 62 by 28 x 49 mm (AP by transverse by CC) for an estimated blood volume of 43 mL, and has mildly extended since presentation (approximately 31 mL at that time). But surrounding edema and regional mass effect have not significantly changed. Partially effaced right lateral ventricle with no ventriculomegaly. No intraventricular or extra-axial extension of blood. No significant midline shift. Basilar cisterns remain patent. Stable gray-white matter differentiation elsewhere. 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 Calvarium and skull base: Intact, negative. Paranasal sinuses: Continued paranasal sinus opacification  maximal in the ethmoids. Tympanic cavities and mastoids remain clear. Orbits: Visualized orbits and scalp soft tissues are within normal limits. Negative noncontrast deep soft tissue spaces of the face. CTA NECK Skeleton: Right side TMJ degeneration. Widespread cervical spine degeneration. No acute or suspicious osseous lesion identified. Upper chest: Mild upper lobe lung scarring mostly on the left. Retained secretions, bubbly opacity in the visible trachea. No superior mediastinal lymphadenopathy. Other neck: Negative.  No neck mass or lymphadenopathy. Aortic arch: Extensive soft and calcified arch atherosclerosis. Three vessel arch configuration. Great vessel origins relatively spared. Right carotid system: Mild brachiocephalic artery and right CCA origin atherosclerosis without stenosis. Mildly tortuous right CCA. Mostly calcified plaque at the right carotid bifurcation and posterior right ICA bulb without stenosis. Mildly tortuous ICA to the skull base. Left carotid system: Minimal plaque at the left CCA origin without stenosis. Soft plaque in the medial vessel at the level of the larynx on series 5, image 131 without stenosis. Minor plaque at the left ICA origin without stenosis. Mildly tortuous left ICA to the skull base. Vertebral arteries: Soft and calcified plaque in the proximal right subclavian artery without stenosis. Right vertebral artery origin remains normal. Tortuous right V1 and proximal V2 segments. Patent right vertebral artery to the skull base without significant plaque or stenosis. Soft and calcified plaque in the proximal left subclavian artery without stenosis. Mild involvement of the left vertebral artery origin without stenosis. Tortuous left V1 and proximal V2 segments. Codominant left vertebral artery is patent to the skull base without significant plaque or stenosis. CTA HEAD Posterior circulation: Codominant distal vertebral arteries with mild calcified plaque mostly on the left. No  significant stenosis. Patent PICA origins and vertebrobasilar junction. Patent basilar artery with mild irregularity but no significant stenosis. Patent SCA and right PCA origins. Fetal left PCA origin. Right  posterior communicating artery diminutive or absent. Bilateral P2 segments are mildly irregular without stenosis. But there is bilateral P3 segment stenosis, mild to moderate involving the inferior division on the left (series 10, image 31) and moderate to severe involving the right P3 trunk on series 10, image 26. Anterior circulation: Both ICA siphons are patent. Abundant siphon calcified plaque on both sides but no significant stenosis on the left. There is moderate stenosis of the right supraclinoid ICA on series 7, image 93. Normal left posterior communicating artery. Patent carotid termini. MCA and ACA origins are within normal limits. No convincing CTA spot sign associated with the right lentiform hemorrhage. Mildly dominant and tortuous right ACA A1 segment. Mild left A1 irregularity without stenosis. 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 Otherwise the bilateral ACA branches are within normal limits. Left MCA M1 segment and bifurcation are patent without stenosis. Left MCA branches are within normal limits. Right MCA M1 segment and bifurcation are patent without stenosis. Right MCA branches are within normal limits. Venous sinuses: Early contrast timing but grossly patent. Anatomic variants: Fetal left PCA origin. Mildly dominant right ACA A1. Review of the MIP images confirms the above findings IMPRESSION: 1. Right lentiform intra-axial hemorrhage has slightly enlarged since presentation, estimated blood volume now 43 mL (previously 31 mL). But there is no CTA spot sign, and mild regional edema and mass effect are stable. 2. 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  There is up to Moderate stenosis of the Right supraclinoid ICA due to calcified plaque. And there is Moderate To Severe bilateral PCA P3 segment stenosis. No significant Vertebrobasilar or cervical carotid stenosis. 3. Aortic Atherosclerosis (ICD10-I70.0). Electronically Signed   By: Genevie Ann M.D.   On: 04/12/2021 06:27      PHYSICAL EXAM  Temp:  [98.8 F (37.1 C)-100.4 F (38 C)] 99.2 F (37.3 C) (07/19 1159) Pulse Rate:  [63-105] 72 (07/19 1500) Resp:  [13-25] 22 (07/19 1500) BP: (99-176)/(55-94) 120/72 (07/19 1500) SpO2:  [92 %-98 %] 92 % (07/19 1500) FiO2 (%):  [40 %] 40 % (07/19 1108) Weight:  [80 kg] 80 kg (07/19 0449)  General -elderly Caucasian male, intubated on sedation.  Ophthalmologic - fundi not visualized due to noncooperation.  Cardiovascular - Regular rate and rhythm, not in afib today.  Neuro - intubated on sedation, eyes closed,  following commands. With forced eye opening, eyes in mid position, able to look to the left only to midline.  Right gaze deviation not blinking to visual threat, doll's eyes sluggish, not tracking, PERRL. Corneal reflex weakly present bilaterally, gag and cough present. Breathing over the vent.  Facial symmetry not able to test due to ET tube.  Tongue protrusion not cooperative. On pain stimulation, able to move right side well and trace movement in the left side.  We will follow only occasional commands on the right DTR diminished and no babinski. Sensation, coordination and gait not tested.   ASSESSMENT/PLAN Mr. Richard Summers is a 80 y.o. male with history of hypertension, hyperlipidemia, MI status post CABG x3, OSA, seizure on Keppra admitted for left-sided weakness, left neglect, fall. No tPA given due to [place].    ICH:  right BG ICH likely hypertensive etiology CT head right BG ICH CTA head and neck moderate stenosis of the right supraclinoid ICA, moderate to severe bilateral P3.  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 CT repeat showed  slight increase of right BG hemorrhage, no CT spot sign, mild regional edema and mass-effect MRI stable right BG hematoma, minimal midline shift CT head repeat 7/15 large stable hematoma right BG, increased mass-effect with edema and mild midline shift 2D Echo EF 45% LDL 149 HgbA1c 6.2 SCDs for VTE prophylaxis No antithrombotic prior to admission, now on No antithrombotic due to [place] Ongoing aggressive stroke risk factor management Therapy recommendations: Pending Disposition: Pending  Cerebral edema CT head repeat showed slight increase of right BG ICH, mild regional edema and mass-effect Given the size of ICH, put on 3% saline with Na goal 150-155. This was stopped and are currently trending Na On FW MRI stable right BG hematoma, minimal midline shift  Respiratory failure ?  Aspiration Intubated overnight On vent and sedation CCM on board  New diagnosed paroxysmal A. fib EKG showed A. Fib  Telemetry A. Fib -> now sinus Rate controlled Not AC candidate  History of seizure On home Keppra XR 2000 daily Continue Keppra 1 g twice daily -> 500 bid for renal adjustment  AKI Urinary retention Creatinine 0.75-1.1-1.79-2.34-2.77-1.54 Could be post renal Foley catheter placed On FW CCM following  Hypertension & hypotension Stable on Cleviprex -> Neo  BP goal < 160 Avoid low BP CCM on board Long term BP goal normotensive  Hyperlipidemia Home meds: Lipitor 10 LDL 149, goal < 70 Hold off Lipitor for now Consider to resume statin at discharge  Dysphagia Was on dysphagia 1 with nectar thick liquid Not n.p.o. Speech on board Consider tube feeding when stable  Other Stroke Risk Factors Advanced age Coronary artery disease/MI status post CABG x3 Obstructive sleep apnea, on CPAP at home  Other Active Problems MGUS Hypokalemia, K 3.2-> supplement ->3.6   Hospital day # 7 Plan continue weaning off ventilatory support as per critical care team and one-way extubation  and no reintubation as per palliative care team's discussion with patient's wife who herself unfortunately is sick and is admitted to [facility] hospital.  Appreciate help from palliative care team to determine goals of care .  Continue strict blood pressure control with systolic below 026.  Wean off ventilatory support and extubate as per critical care team.  No family available at the bedside for discussion.  I called the patient's wife's listed mobile phone number and was unable to leave a message and nobody answered.  Discussed with Dr. Tacy Learn critical care team MD This patient is critically ill due to respiratory failure, right large BG ICH, aspiration, AKI, hypotension, cerebral edema new diagnosed A. fib and at significant risk of neurological worsening, death form brain herniation, hematoma expansion, renal failure, recurrent stroke, heart failure. This patient's care requires constant monitoring of vital signs, hemodynamics, respiratory and cardiac monitoring, review of multiple databases, neurological assessment, discussion with family, other specialists and medical decision making of high complexity. I spent 30 minutes of neurocritical care time in the care of this patient.  I discussed with CCM team   Antony Contras, MD Stroke Neurology 04/18/2021 3:47 PM    To contact Stroke Continuity provider, please refer to [email or web address] After hours, contact General Neurology

## 2021-04-18 NOTE — Progress Notes (Signed)
SLP Cancellation Note  Patient Details Name: Richard Summers MRN: [id number] DOB: November 11, 1940   Cancelled treatment:        Pt intubated now since 7/14. Will follow along for swallow, cognition when able.    Houston Siren 04/18/2021, 9:25 AM  Orbie Pyo Colvin Caroli.Ed Risk analyst [phone] Office [phone]

## 2021-04-18 NOTE — Progress Notes (Signed)
Daily Progress Note   Patient Name: Natale Thoma       Date: 04/18/2021 DOB: September 17, 1941  Age: 80 y.o. MRN#: [id number] Attending Physician: Stroke, Md, MD Primary Care Physician: Harrel Lemon, MD Admit Date: 03/31/2021 Length of Stay: 7 days  Reason for Consultation/Follow-up: Establishing goals of care  HPI/Patient Profile:  80 y.o. 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 He initially presented to emergency department on 04/20/2021 after his wife heard him fall in the shower. On EMS arrival he was unable to move his left side. Code stroke was called on arrival to the ED and CT scan showed intracranial hemorrhage and cerebral edema. On 7/14, he was intubated for airway protection in the setting of worsening hypoxia and witnessed aspiration event.  Discussion was had with the patient's wife [name] who is admitted to [facility]. She indicated patient is a DNR and would not want to be on a ventilator and the plan was made for terminal/compassionate extubation in the next few days. Patient is caregiver for his wife and she is essentially bed-bound. She has a planned d/c dispo of SNF/Rehab.   Daughter [name] lives in [place] and has applied for emergency permission to fly, however does not want extubation delayed for her visit. There is a request for the patient to be transported to [facility] for visit and extubation. [facility]-link virtual visit is the back-up plan.  Arrangement were made for [facility] transport to pick up patient's wife from [facility] at d/c and bring her to [facility] to visit husband prior to extubation. [facility] will also pick her up and bring her to SNF; her POA to accompany her. They're aware staff cannot legally provide any care/assistance while she's here. Her TOC was [place] [phone].  Current Medications: Scheduled Meds:   bethanechol  10 mg Per Tube QID   chlorhexidine gluconate (MEDLINE KIT)  15  mL Mouth Rinse BID   Chlorhexidine Gluconate Cloth  6 each Topical Daily   enoxaparin (LOVENOX) injection  40 mg Subcutaneous Q24H   feeding supplement (PROSource TF)  45 mL Per Tube TID   free water  300 mL Per Tube Q6H   insulin aspart  0-9 Units Subcutaneous Q4H   levETIRAcetam  1,000 mg Per Tube BID   mouth rinse  15 mL Mouth Rinse 10 times per day   pantoprazole sodium  40 mg Per Tube Daily   polyethylene glycol  17 g Per Tube Daily   senna-docusate  1 tablet Per Tube BID   sodium chloride flush  10-40 mL Intracatheter Q12H   sodium chloride flush  3 mL Intravenous Once    Continuous Infusions:  dexmedetomidine (PRECEDEX) IV infusion Stopped (04/16/21 1840)   feeding supplement (VITAL 1.5 CAL) 60 mL/hr at 04/16/21 0807   fentaNYL infusion INTRAVENOUS 50 mcg/hr (04/18/21 0800)   levofloxacin (LEVAQUIN) IV 750 mg (04/17/21 2329)    PRN Meds: acetaminophen **OR** acetaminophen (TYLENOL) oral liquid 160 mg/5 mL **OR** acetaminophen, albuterol, fentaNYL (SUBLIMAZE) injection, fentaNYL (SUBLIMAZE) injection, labetalol, metoprolol tartrate, sodium chloride flush  Subjective:   Subjective: Chart Reviewed. Updates received. Patient Assessed. Created space and opportunity for patient  and family to explore thoughts and feelings regarding current medical situation.  Today's Discussion: Patient unresponsive. Attempted to call the patient's wife, but no answer and no voicemail box/unable to leave message. Discussed plan with [name] (pt wife) Social Worker/TOC and informed that [name] does not want her husband to  be extubated while she is there. She wants to visit and then extubate after she leaves.  Review of Systems  Unable to perform ROS: Intubated   Objective:   Vital Signs: BP (!) 154/94   Pulse (!) 105   Temp 99.2 F (37.3 C) (Axillary)   Resp (!) 23   Ht 5' 6" (1.676 m)   Wt 80 kg   SpO2 95%   BMI 28.47 kg/m  SpO2: SpO2: 95 % O2 Device: O2 Device: Ventilator O2 Flow Rate:  O2 Flow Rate (L/min): 3 L/min  Physical Exam: Physical Exam Constitutional:      General: He is sleeping.     Interventions: He is intubated.  HENT:     Head: Normocephalic and atraumatic.  Pulmonary:     Effort: No respiratory distress. He is intubated.  Abdominal:     General: Abdomen is flat.     Palpations: Abdomen is soft.  Skin:    General: Skin is warm and dry.    SpO2: SpO2: 95 % O2 Device:SpO2: 95 % O2 Flow Rate: .O2 Flow Rate (L/min): 3 L/min  IO: Intake/output summary:  Intake/Output Summary (Last 24 hours) at 04/18/2021 1208 Last data filed at 04/18/2021 1200 Gross per 24 hour  Intake 2956.89 ml  Output 2280 ml  Net 676.89 ml    LBM: Last BM Date:  (pta) Baseline Weight: Weight: 74.8 kg Most recent weight: Weight: 80 kg   Palliative Assessment/Data: 10%   Assessment & Plan:   SUMMARY OF RECOMMENDATIONS   Make DNR based on wife's information per last PMT note Stop tube feeds, free water, prosource Will maintain other medications for now to continue current level of care until wife can visit Anticipate compassionate extubation tomorrow after wife able to visit Start Robinul 0.75m q6 hrs for secretions Stop unnecessary medications (lovenox, abx, PPI  Code Status: DNR  Goals of Care/Recommendations: Planned terminal extubation tomorrow after wife visit At that point shift to full comfort care  Symptom Management: Robinul for secretions Continue Fentanyl gtt, indication for pain/dyspnea Add Haldol prn for agitation, Ativan prn for anxiety, Zofran prn N/V  Prognosis: Hours - Days  Discharge Planning: Anticipated Hospital Death  Discussed with: Nursing staff, PCCM physician, [facility] Center[facility]  Thank you for allowing [place] participate in the care of ACottrell GentlesPMT will continue to support holistically.  Time Total: 75 min  Visit consisted of counseling and education dealing with the complex and emotionally intense issues of symptom management  and palliative care in the setting of serious and potentially life-threatening illness. Greater than 50%  of this time was spent counseling and coordinating care related to the above assessment and plan.  EWalden Field NP Palliative Medicine Team  Team Phone # [phone](Nights/Weekends)  04/18/2021, 12:08 PM

## 2021-04-18 NOTE — Progress Notes (Signed)
[facility] ADULT ICU REPLACEMENT PROTOCOL   The patient does apply for the [facility] Adult ICU Electrolyte Replacment Protocol based on the criteria listed below:   1.Exclusion criteria: TCTS patients, ECMO patients and Hypothermia Protocol, and   Dialysis patients 2. Is GFR >/= 30 ml/min? Yes.    Patient's GFR today is >60 3. Is SCr </= 2? No. Patient's SCr is 0.78 mg/dL 4. Did SCr increase >/= 0.5 in 24 hours? No. 5.Pt's weight >40kg  Yes.   6. Abnormal electrolyte(s): K= 3.5  7. Electrolytes replaced per protocol 8.  Call MD STAT for K+ </= 2.5, Phos </= 1, or Mag </= 1 Physician:  n/a  Richard Summers 04/18/2021 6:17 AM

## 2021-04-19 DIAGNOSIS — G4733 Obstructive sleep apnea (adult) (pediatric): Secondary | ICD-10-CM | POA: Diagnosis not present

## 2021-04-19 DIAGNOSIS — I10 Essential (primary) hypertension: Secondary | ICD-10-CM

## 2021-04-19 DIAGNOSIS — R0609 Other forms of dyspnea: Secondary | ICD-10-CM

## 2021-04-19 DIAGNOSIS — T17908A Unspecified foreign body in respiratory tract, part unspecified causing other injury, initial encounter: Secondary | ICD-10-CM | POA: Diagnosis not present

## 2021-04-19 DIAGNOSIS — Z66 Do not resuscitate: Secondary | ICD-10-CM

## 2021-04-19 DIAGNOSIS — I619 Nontraumatic intracerebral hemorrhage, unspecified: Secondary | ICD-10-CM | POA: Diagnosis not present

## 2021-04-19 LAB — CBC WITH DIFFERENTIAL/PLATELET
Abs Immature Granulocytes: 0.47 10*3/uL — ABNORMAL HIGH (ref 0.00–0.07)
Basophils Absolute: 0.1 10*3/uL (ref 0.0–0.1)
Basophils Relative: 1 %
Eosinophils Absolute: 0.3 10*3/uL (ref 0.0–0.5)
Eosinophils Relative: 3 %
HCT: 36.5 % — ABNORMAL LOW (ref 39.0–52.0)
Hemoglobin: 11.9 g/dL — ABNORMAL LOW (ref 13.0–17.0)
Immature Granulocytes: 5 %
Lymphocytes Relative: 10 %
Lymphs Abs: 0.9 10*3/uL (ref 0.7–4.0)
MCH: 31.8 pg (ref 26.0–34.0)
MCHC: 32.6 g/dL (ref 30.0–36.0)
MCV: 97.6 fL (ref 80.0–100.0)
Monocytes Absolute: 1.2 10*3/uL — ABNORMAL HIGH (ref 0.1–1.0)
Monocytes Relative: 12 %
Neutro Abs: 6.5 10*3/uL (ref 1.7–7.7)
Neutrophils Relative %: 69 %
Platelets: 109 10*3/uL — ABNORMAL LOW (ref 150–400)
RBC: 3.74 MIL/uL — ABNORMAL LOW (ref 4.22–5.81)
RDW: 13 % (ref 11.5–15.5)
WBC: 9.4 10*3/uL (ref 4.0–10.5)
nRBC: 0.2 % (ref 0.0–0.2)

## 2021-04-19 LAB — BASIC METABOLIC PANEL
Anion gap: 7 (ref 5–15)
BUN: 27 mg/dL — ABNORMAL HIGH (ref 8–23)
CO2: 30 mmol/L (ref 22–32)
Calcium: 8.5 mg/dL — ABNORMAL LOW (ref 8.9–10.3)
Chloride: 109 mmol/L (ref 98–111)
Creatinine, Ser: 0.74 mg/dL (ref 0.61–1.24)
GFR, Estimated: >60 mL/min (ref >60)
Glucose, Bld: 121 mg/dL — ABNORMAL HIGH (ref 70–99)
Potassium: 3.7 mmol/L (ref 3.5–5.1)
Sodium: 146 mmol/L — ABNORMAL HIGH (ref 135–145)

## 2021-04-19 LAB — GLUCOSE, CAPILLARY
Glucose-Capillary: 108 mg/dL — ABNORMAL HIGH (ref 70–99)
Glucose-Capillary: 140 mg/dL — ABNORMAL HIGH (ref 70–99)
Glucose-Capillary: 134 mg/dL — ABNORMAL HIGH (ref 70–99)

## 2021-04-19 LAB — PHOSPHORUS: Phosphorus: 3.4 mg/dL (ref 2.5–4.6)

## 2021-04-19 LAB — MAGNESIUM: Magnesium: 2 mg/dL (ref 1.7–2.4)

## 2021-04-19 MED ORDER — FENTANYL BOLUS VIA INFUSION
50.0000 ug | INTRAVENOUS | Status: DC | PRN
  Administered 2021-04-19: 50 ug via INTRAVENOUS
  Filled 2021-04-19: qty 50

## 2021-04-19 MED ORDER — LORAZEPAM 2 MG/ML IJ SOLN
2.0000 mg | INTRAMUSCULAR | Status: DC | PRN
  Administered 2021-04-19: 2 mg via INTRAVENOUS
  Filled 2021-04-19: qty 1

## 2021-04-19 MED ORDER — GLYCOPYRROLATE 0.2 MG/ML IJ SOLN
0.4000 mg | Freq: Four times a day (QID) | INTRAMUSCULAR | Status: DC
  Administered 2021-04-19: 0.4 mg via INTRAVENOUS
  Filled 2021-04-19: qty 2

## 2021-04-19 MED ORDER — FENTANYL CITRATE (PF) 100 MCG/2ML IJ SOLN: 100.0000 ug | INTRAMUSCULAR | Status: DC | PRN

## 2021-04-19 MED ORDER — FENTANYL CITRATE (PF) 100 MCG/2ML IJ SOLN: 25.0000 ug | INTRAMUSCULAR | Status: DC | PRN

## 2021-04-19 MED ORDER — FENTANYL BOLUS VIA INFUSION
30.0000 ug | INTRAVENOUS | Status: DC | PRN
  Administered 2021-04-19 (×2): 30 ug via INTRAVENOUS
  Filled 2021-04-19: qty 30

## 2021-04-19 MED ORDER — LEVOFLOXACIN IN D5W 750 MG/150ML IV SOLN
750.0000 mg | INTRAVENOUS | Status: DC
  Administered 2021-04-19: 750 mg via INTRAVENOUS
  Filled 2021-04-19: qty 150

## 2021-05-01 NOTE — Progress Notes (Signed)
PT Cancellation Note  Patient Details Name: Malahki Gasaway MRN: [id number] DOB: 1941-03-18   Cancelled Treatment:    Reason Eval/Treat Not Completed: Other (comment);Medical issues which prohibited therapy. Pt going comfort care at this time. PT will sign off.   Moishe Spice, PT, DPT Acute Rehabilitation Services  Pager: [phone] Office: [place] 2021-04-21, 2:34 PM

## 2021-05-01 NOTE — Progress Notes (Signed)
Patient ID: Richard Summers, male   DOB: Oct 25, 1940, 80 y.o.   MRN: [id number]     Progress Note from the Palliative Medicine Team at [facility]   Patient Name: Richard Summers        Date: 04-May-2021 DOB: 1941-03-16  Age: 80 y.o. MRN#: [id number] Attending Physician: Stroke, Md, MD Primary Care Physician: Harrel Lemon, MD Admit Date: 04/07/2021   Medical records reviewed   80 y.o. 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 He initially presented to emergency department on 04/21/2021 after his wife heard him fall in the shower. On EMS arrival he was unable to move his left side. Code stroke was called on arrival to the ED and CT scan showed intracranial hemorrhage and cerebral edema. On 7/14, he was intubated for airway protection in the setting of worsening hypoxia and witnessed aspiration event.  Ongoing conversation with patient's wife who is an inpatient at [facility].  Decision made for patient to be documented as  a DNR and would not want to be on a ventilator,  plan was made for terminal/compassionate extubation in the next few days.  Plan put in place with Sarah Boswell/licensed clinical social worker at [facility] that once patient's wife is discharged from the hospital she will have the opportunity to visit her husband here at [facility] before transport to skilled nursing facility/rehab.  After visit patient will be liberated from the vent, focus of care will shift to comfort and dignity focusing on the natural death..  This NP in conversation with [facility] Sarah Charlynn Grimes LCSW and [place] NP with PMT in preparation for arrival of spouse to the bedside to visit with her husband before she transitions to SNF for long term care.    This NP visited patient at the bedside as a follow up with wife and her companion [name]/HPOA.  Education offered on the steps that will be  followed for a liberation from the vent, ensuring comfort and quality at end-of-life.  Education offered on the natural trajectory and expectations at end-of-life.  Questions and concerns addressed.  Emotional support offered.  Plan of Care:  -DNR/DNI -one way extubation, shift to full comfort allowing for a natural death after wife has opportunity to visit -Continuous fentanyl infusion with as needed boluses -Ativan boluses as needed for agitation, Robinul for terminal secretions -Prognosis is likely hours to days.   Discussed with Dr Tacy Learn and bedside RN and RT  Total time spent on the unit was 60 minutes  PMT will continue to support holistically  Greater than 50% of the time was spent in counseling and coordination of care  Wadie Lessen NP  Palliative Medicine Team Team Phone # [phone] Pager [phone]

## 2021-05-01 NOTE — Discharge Summary (Signed)
Patient ID: Romulus Crafts MRN: [id number] DOB/AGE: 79-Jan-1942 79 y.o.  Admit date: 05-08-21 Death date: 2021/05/16  Admission Diagnoses: Left-sided weakness  Cause of Death: Respiratory failure secondary to large right sided parenchymal hemorrhage due to hypertension with cytotoxic edema.  Patient made DNR and comfort care upon family request  Pertinent Medical Diagnosis: Principal Problem:   Hemorrhagic stroke ([place]) Cytotoxic cerebral edema Right to left midline shift and brain herniation Respiratory failure Active Problems:   Hypertension   Barrett's esophagus   Ischemic cardiomyopathy   MR (mitral regurgitation)   OSA on CPAP   Hypoxia   Aspiration into airway   Malnutrition of moderate degree   Hospital Course: Jadian Droz is a 80 y.o. 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  Reportedly he had cooked dinner for his wife around 5 PM.  She then heard him fall while he was taking a shower and activated EMS who found him cold and wet, half out of the shower, unable to move his left side, unable to recognize his left side, unable to see on the left, unaware that he was having any problems.  Code stroke was activated and head CT on arrival showed hemorrhage.  EMS noted blood pressures in the 130s over 70s, heart rates in the 70s, satting 95% on room air.  He was not considered for tPA due to intracerebral hemorrhage.  ICH score was 1 on admission.  NIH stroke scale was 16. CT scan of the head on admission showed a large right basal ganglia hemorrhage without intraventricular extension but cytotoxic edema with mild right to left brain herniation.  Volume of the hemorrhage was approximately 45 cubic cc.  Patient was admitted to the intensive care unit for blood pressure was tightly  controlled.  He was started on hypertonic saline to control cytotoxic cerebral edema.  He was intubated for respiratory failure and inability to protect his airway.  Critical care team was consulted and managed his ventilatory status.  Follow-up CT scan showed stable appearance of the hematoma but persistent cytotoxic cerebral edema and right left midline shift.  Patient was found to be arousable and following simple commands on the right side but had persistent dense left hemiplegia and left-sided neglect and left field cut and right gaze deviation.  Patient's wife is unfortunately also sick and admitted to [facility] and could not come to visit him.  Given his poor prognosis and large hemorrhage and likely poor quality of life palliative care team was consulted which contacted family about goals of care.  After multiple discussions patient's wife finally came to see him on the day of his death after being discharged from the hospital herself and made him DNR a and agreed to one-way extubation.  Shortly after extubation however the patient developed respiratory distress and was not comfortable patient's wife did not change him to full comfort care measures only and he was kept comfortable and passed away shortly thereafter.   .  SignedAntony Contras 04/21/2021, 7:29 AM

## 2021-05-01 NOTE — Progress Notes (Signed)
NAME:  Richard Summers, MRN:  [id number], DOB:  [birth year], LOS: 8 ADMISSION DATE:  04/22/2021, CONSULTATION DATE:  7/14 REFERRING MD:  Dr. Cheral Marker, CHIEF COMPLAINT:  ICH   History of Present Illness:  Patient is encephalopathic and/or intubated. Therefore history has been obtained from chart review.   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  According to reports the patient's wife heard him fall while taking a shower and called EMS.  Upon their arrival he was unable to move his left side.  He was transported to [facility] emergency department as a code stroke and CT scan upon arrival demonstrated intercranial hemorrhage.  Cerebral edema also demonstrated on scan.  He was admitted to the neuro ICU for close monitoring and Cleviprex infusion for blood pressure goal less than 140 mmHg. He was started on hypertonic saline for worsening cerebral edema. 4/14 there was some concern for his airway. He was coughing after drinking nectar thick liquid and having upper airway secretions despite suctioning. When he became hypoxic requiring increased oxygen, PCCM was consulted.   Significant Hospital Events: Including procedures, antibiotic start and stop dates in addition to other pertinent events   7/12 admitted with [place] 7/14 intubated for airway protection given worsening hypoxia and witnessed aspiration; started on levaquin   7/15 tmax 101, HTS off, urinary retention/ sCr up -foley replaced, off cleviprex but hypotensive requiring vasopressors, repeated CTH, R PICC placed 7/16 no SBT given tachypnea at baseline, off HTS, neuro stable, tmax 100.6, improving sCr/ great UOP, off Neo, started on HTN meds  Interim History / Subjective:  Patient is spiked fever with T-max 100.9 He gets tachypneic easily on pressure support trial, had to be switched back on full  vent support  Objective   Blood pressure (!) 148/86, pulse 98, temperature (!) 100.9 F (38.3 C), temperature source Axillary, resp. rate (!) 25, height 5\' 6"  (1.676 m), weight 77.6 kg, SpO2 95 %.    Vent Mode: CPAP;PSV FiO2 (%):  [40 %] 40 % Set Rate:  [22 bmp] 22 bmp Vt Set:  [510 mL] 510 mL PEEP:  [5 cmH20] 5 cmH20 Pressure Support:  [5 cmH20] 5 cmH20 Plateau Pressure:  [13 cmH20-19 cmH20] 19 cmH20   Intake/Output Summary (Last 24 hours) at 04/20/2021 0973 Last data filed at April 20, 2021 0800 Gross per 24 hour  Intake 1163.88 ml  Output 1958 ml  Net -794.12 ml   Filed Weights   04/16/21 0400 04/18/21 0449 2021-04-20 0419  Weight: 79.7 kg 80 kg 77.6 kg    Examination:   Physical exam: General: Acute on chronically ill-appearing elderly Caucasian male, orally intubated HEENT: [place]/AT, eyes anicteric.  ETT and OGT in place Neuro: Eyes open, following commands, moving the right side, plegic on left.  Right gaze deviation, does not cross midline Chest: Coarse breath sounds, no wheezes or rhonchi Heart: Irregularly irregular, no murmurs or gallops Abdomen: Soft, nontender, nondistended, bowel sounds present Skin: No rash   Resolved Hospital Problem list   Septic shock Postobstructive acute kidney injury Non-anion gap metabolic acidosis Hypokalemia Hypomagnesemia  Assessment & Plan:  Right-sided basal ganglia intraparenchymal hemorrhage likely due to hypertension Cerebral edema, improved Induced hypernatremia with hypertonic saline Continue secondary stroke prophylaxis SBP goal <160 Continue secondary stroke prophylaxis Patient is off hypertonic saline Serum sodium is trending down now it is 146 Decrease free water to 200 mL every 6 hours Hold antiplatelet  therapy due to intraparenchymal hemorrhage  Acute hypoxemic respiratory failure secondary to aspiration pneumonia with gram-positive cocci in pairs and chains Continue lung protective ventilation Patient is getting  tachypneic unresponsive breathing trial today Antibiotics were stopped after 4 days of treatment He spiked fever again with T-max 100.9 Will restart levofloxacin to complete total of 7 days therapy VAP prevention protocol/ PPI Patient is scheduled for one-way extubation today after his wife comes and visit him  Newly diagnosed persistent atrial fibrillation Heart rate is well controlled Not a candidate for [facility] at this time given ICH  Continue as needed metoprolol  Seizure disorder Continue home Keppra Continue seizure precautions  Acute urinary retention Patient developed AKI due to acute urinary retention Continue Foley catheter, trial of void today Continue bethanechol  Chronic systolic congestive heart failure Patient looks euvolemic Monitor intake and output  Best Practice (right click and "Reselect all SmartList Selections" daily)   Diet/type: NPO; continue TF DVT prophylaxis: SCD GI prophylaxis: PPI Lines: PICC line Foley: Yes, trial of void today Code Status: DNR Last date of multidisciplinary goals of care discussion per primary; palliative care had phone conversation with patient's wife, [name] [phone], is currently hospitalized at [facility], she is planning to visit patient today  Labs   CBC: Recent Labs  Lab 04/15/21 0404 04/16/21 0553 04/17/21 0527 04/18/21 0434 05/11/2021 0429  WBC 10.6* 9.0 10.7* 9.1 9.4  NEUTROABS  --   --   --  6.8 6.5  HGB 13.7 11.9* 12.0* 11.4* 11.9*  HCT 39.9 36.7* 36.3* 35.3* 36.5*  MCV 94.5 97.9 96.0 97.8 97.6  PLT 77* 69* 68* 82* 109*    Basic Metabolic Panel: Recent Labs  Lab 04/15/21 0411 04/15/21 1728 04/15/21 1948 04/16/21 0553 04/17/21 0527 04/18/21 0434 05/11/21 0429  NA  --   --  160* 160* 156* 149* 146*  K  --   --  3.1* 3.8 3.6 3.5 3.7  CL  --   --  >130* >130* 121* 113* 109  CO2  --   --  25 28 30 31 30   GLUCOSE  --   --  183* 187* 133* 168* 121*  BUN  --   --  37* 36* 32* 30* 27*  CREATININE  --   --   1.22 1.10 0.85 0.78 0.74  CALCIUM  --   --  7.9* 8.0* 8.0* 8.2* 8.5*  MG 1.8 2.4  --  2.1 1.9 2.0 2.0  PHOS 2.4* 1.4*  --  1.4* 2.8  --  3.4   GFR: Estimated Creatinine Clearance: 73.4 mL/min (by C-G formula based on SCr of 0.74 mg/dL). Recent Labs  Lab 04/16/21 0553 04/17/21 0527 04/18/21 0434 May 11, 2021 0429  PROCALCITON 13.93 7.42 4.04  --   WBC 9.0 10.7* 9.1 9.4    Liver Function Tests: Recent Labs  Lab 04/17/21 0527  ALBUMIN 2.0*   No results for input(s): LIPASE, AMYLASE in the last 168 hours. No results for input(s): AMMONIA in the last 168 hours.  ABG    Component Value Date/Time   PHART 7.259 (L) 04/14/2021 0518   PCO2ART 39.8 04/14/2021 0518   PO2ART 90 04/14/2021 0518   HCO3 17.8 (L) 04/14/2021 0518   TCO2 19 (L) 04/14/2021 0518   ACIDBASEDEF 9.0 (H) 04/14/2021 0518   O2SAT 96.0 04/14/2021 0518     Coagulation Profile: No results for input(s): INR, PROTIME in the last 168 hours.   Cardiac Enzymes: No results for input(s): CKTOTAL, CKMB, CKMBINDEX, TROPONINI in the last  168 hours.  HbA1C: Hgb A1c MFr Bld  Date/Time Value Ref Range Status  04/13/2021 02:38 AM 6.2 (H) 4.8 - 5.6 % Final    Comment:    (NOTE) Pre diabetes:          5.7%-6.4%  Diabetes:              >6.4%  Glycemic control for   <7.0% adults with diabetes     CBG: Recent Labs  Lab 04/18/21 1539 04/18/21 1929 04/18/21 2312 2021/05/14 0325 05/14/21 0745  GLUCAP 266* 217* 139* 108* 140*   Total critical care time: 36 minutes  Performed by: [place] care time was exclusive of separately billable procedures and treating other patients.   Critical care was necessary to treat or prevent imminent or life-threatening deterioration.   Critical care was time spent personally by me on the following activities: development of treatment plan with patient and/or surrogate as well as nursing, discussions with consultants, evaluation of patient's response to treatment,  examination of patient, obtaining history from patient or surrogate, ordering and performing treatments and interventions, ordering and review of laboratory studies, ordering and review of radiographic studies, pulse oximetry and re-evaluation of patient's condition.   Jacky Kindle MD [place] See Amion for pager If no response to pager, please call [phone] until 7pm After 7pm, Please call E-link [phone]

## 2021-05-01 NOTE — Progress Notes (Signed)
Pharmacy Antibiotic Note  Nazaire Cordial is a 80 y.o. male admitted on 04/12/2021 with pneumonia.  Pharmacy has been consulted for Levaquin dosing.  ID: PNA - Tmax 100.9 (?central), WBC 9.4, PC down to 4.  Levaquin 7/15 >>  7/13 MRSA PCR - negative 7/14 TA - normal flora   Plan: -Continue Levaquin to 750mg  IV Q24H. D#6.  Pharmacy will sign off. Please reconsult for further dosing assitance.   Height: 5\' 6"  (167.6 cm) Weight: 77.6 kg (171 lb 1.2 oz) IBW/kg (Calculated) : 63.8  Temp (24hrs), Avg:99.5 F (37.5 C), Min:98.4 F (36.9 C), Max:100.9 F (38.3 C)  Recent Labs  Lab 04/15/21 0404 04/15/21 1948 04/16/21 0553 04/17/21 0527 04/18/21 0434 12-May-2021 0429  WBC 10.6*  --  9.0 10.7* 9.1 9.4  CREATININE 1.54* 1.22 1.10 0.85 0.78 0.74    Estimated Creatinine Clearance: 73.4 mL/min (by C-G formula based on SCr of 0.74 mg/dL).    Allergies  Allergen Reactions   Penicillins Anaphylaxis, Hives and Other (See Comments)   Sulfa Antibiotics Anaphylaxis, Hives and Other (See Comments)   Ezetimibe Other (See Comments)   Rosuvastatin Other (See Comments)   Statins     Other reaction(s): Other (See Comments) Confusion and mania     Alba Perillo S. Alford Highland, PharmD, BCPS Clinical Staff Pharmacist [email or web address]  Wayland Salinas 05/12/2021 1:18 PM

## 2021-05-01 NOTE — Progress Notes (Addendum)
STROKE TEAM PROGRESS NOTE   SUBJECTIVE (INTERVAL HISTORY)  No acute events overnight, continues to tolerate pressure support trial. Waiting on patients wife to come in for one way extubation.   Na 146 today.  Vital signs and neurological exam are stable. Waiting on patients wife to arrive for one way extubation.   Neurological examination is stable, following commands with right hand to show two fingers, give thumbs up, LUE and LLE are plegic.   OBJECTIVE Temp:  [98.8 F (37.1 C)-100.9 F (38.3 C)] 100.1 F (37.8 C) (07/20 1200) Pulse Rate:  [64-105] 104 (07/20 1500) Cardiac Rhythm: Normal sinus rhythm;Sinus tachycardia (07/20 0800) Resp:  [20-32] 28 (07/20 1500) BP: (104-153)/(59-90) 130/67 (07/20 1500) SpO2:  [75 %-95 %] 75 % (07/20 1500) FiO2 (%):  [40 %] 40 % (07/20 1050) Weight:  [77.6 kg] 77.6 kg (07/20 0419)  Recent Labs  Lab 04/18/21 1929 04/18/21 2312 04/24/21 0325 April 24, 2021 0745 04/24/2021 1126  GLUCAP 217* 139* 108* 140* 134*   Recent Labs  Lab 04/15/21 0411 04/15/21 1728 04/15/21 1948 04/16/21 0553 04/17/21 0527 04/18/21 0434 04/24/2021 0429  NA  --   --  160* 160* 156* 149* 146*  K  --   --  3.1* 3.8 3.6 3.5 3.7  CL  --   --  >130* >130* 121* 113* 109  CO2  --   --  25 28 30 31 30   GLUCOSE  --   --  183* 187* 133* 168* 121*  BUN  --   --  37* 36* 32* 30* 27*  CREATININE  --   --  1.22 1.10 0.85 0.78 0.74  CALCIUM  --   --  7.9* 8.0* 8.0* 8.2* 8.5*  MG 1.8 2.4  --  2.1 1.9 2.0 2.0  PHOS 2.4* 1.4*  --  1.4* 2.8  --  3.4   Recent Labs  Lab 04/17/21 0527  ALBUMIN 2.0*   Recent Labs  Lab 04/15/21 0404 04/16/21 0553 04/17/21 0527 04/18/21 0434 04-24-2021 0429  WBC 10.6* 9.0 10.7* 9.1 9.4  NEUTROABS  --   --   --  6.8 6.5  HGB 13.7 11.9* 12.0* 11.4* 11.9*  HCT 39.9 36.7* 36.3* 35.3* 36.5*  MCV 94.5 97.9 96.0 97.8 97.6  PLT 77* 69* 68* 82* 109*   No results for input(s): CKTOTAL, CKMB, CKMBINDEX, TROPONINI in the last 168 hours. No results for  input(s): LABPROT, INR in the last 72 hours.  No results for input(s): COLORURINE, LABSPEC, [place], GLUCOSEU, HGBUR, BILIRUBINUR, KETONESUR, PROTEINUR, UROBILINOGEN, NITRITE, LEUKOCYTESUR in the last 72 hours.  Invalid input(s): APPERANCEUR      Component Value Date/Time   CHOL 239 (H) 04/13/2021 0238   TRIG 62 04/14/2021 0409   HDL 72 04/13/2021 0238   CHOLHDL 3.3 04/13/2021 0238   VLDL 18 04/13/2021 0238   LDLCALC 149 (H) 04/13/2021 0238   Lab Results  Component Value Date   HGBA1C 6.2 (H) 04/13/2021      Component Value Date/Time   LABOPIA NONE DETECTED 04/12/2021 0107   COCAINSCRNUR NONE DETECTED 04/12/2021 0107   LABBENZ NONE DETECTED 04/12/2021 0107   AMPHETMU NONE DETECTED 04/12/2021 0107   THCU NONE DETECTED 04/12/2021 0107   LABBARB NONE DETECTED 04/12/2021 0107    No results for input(s): ETH in the last 168 hours.  I have personally reviewed the radiological images below and agree with the radiology interpretations.  CT ANGIO HEAD W OR WO CONTRAST  Result Date: 04/12/2021 CLINICAL DATA:  80 year old male code  stroke presentation on 04/27/2021 with right lentiform intra-axial hemorrhage. Subsequent encounter. EXAM: CT ANGIOGRAPHY HEAD AND NECK TECHNIQUE: Multidetector CT imaging of the head and neck was performed using the standard protocol during bolus administration of intravenous contrast. Multiplanar CT image reconstructions and MIPs were obtained to evaluate the vascular anatomy. Carotid stenosis measurements (when applicable) are obtained utilizing NASCET criteria, using the distal internal carotid diameter as the denominator. CONTRAST:  27mL OMNIPAQUE IOHEXOL 350 MG/ML SOLN COMPARISON:  Head CT 04/28/2021. FINDINGS: CT HEAD Brain: Calcified atherosclerosis at the skull base. Hyperdense intra-axial hemorrhage centered at the right lentiform encompasses 62 by 28 x 49 mm (AP by transverse by CC) for an estimated blood volume of 43 mL, and has mildly extended since  presentation (approximately 31 mL at that time). But surrounding edema and regional mass effect have not significantly changed. Partially effaced right lateral ventricle with no ventriculomegaly. No intraventricular or extra-axial extension of blood. No significant midline shift. Basilar cisterns remain patent. Stable gray-white matter differentiation elsewhere. 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 Calvarium and skull base: Intact, negative. Paranasal sinuses: Continued paranasal sinus opacification maximal in the ethmoids. Tympanic cavities and mastoids remain clear. Orbits: Visualized orbits and scalp soft tissues are within normal limits. Negative noncontrast deep soft tissue spaces of the face. CTA NECK Skeleton: Right side TMJ degeneration. Widespread cervical spine degeneration. No acute or suspicious osseous lesion identified. Upper chest: Mild upper lobe lung scarring mostly on the left. Retained secretions, bubbly opacity in the visible trachea. No superior mediastinal lymphadenopathy. Other neck: Negative.  No neck mass or lymphadenopathy. Aortic arch: Extensive soft and calcified arch atherosclerosis. Three vessel arch configuration. Great vessel origins relatively spared. Right carotid system: Mild brachiocephalic artery and right CCA origin atherosclerosis without stenosis. Mildly tortuous right CCA. Mostly calcified plaque at the right carotid bifurcation and posterior right ICA bulb without stenosis. Mildly tortuous ICA to the skull base. Left carotid system: Minimal plaque at the left CCA origin without stenosis. Soft plaque in the medial vessel at the level of the larynx on series 5, image 131 without stenosis. Minor plaque at the left ICA origin without stenosis. Mildly tortuous left ICA to the skull base. Vertebral arteries: Soft and calcified plaque in the proximal right subclavian artery without stenosis. Right vertebral artery origin remains normal. Tortuous right V1 and proximal  V2 segments. Patent right vertebral artery to the skull base without significant plaque or stenosis. Soft and calcified plaque in the proximal left subclavian artery without stenosis. Mild involvement of the left vertebral artery origin without stenosis. Tortuous left V1 and proximal V2 segments. Codominant left vertebral artery is patent to the skull base without significant plaque or stenosis. CTA HEAD Posterior circulation: Codominant distal vertebral arteries with mild calcified plaque mostly on the left. No significant stenosis. Patent PICA origins and vertebrobasilar junction. Patent basilar artery with mild irregularity but no significant stenosis. Patent SCA and right PCA origins. Fetal left PCA origin. Right posterior communicating artery diminutive or absent. Bilateral P2 segments are mildly irregular without stenosis. But there is bilateral P3 segment stenosis, mild to moderate involving the inferior division on the left (series 10, image 31) and moderate to severe involving the right P3 trunk on series 10, image 26. Anterior circulation: Both ICA siphons are patent. Abundant siphon calcified plaque on both sides but no significant stenosis on the left. There is moderate stenosis of the right supraclinoid ICA on series 7, image 93. Normal left posterior communicating artery. Patent carotid termini.  MCA and ACA origins are within normal limits. No convincing CTA spot sign associated with the right lentiform hemorrhage. Mildly dominant and tortuous right ACA A1 segment. Mild left A1 irregularity without stenosis. 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 Otherwise the bilateral ACA branches are within normal limits. Left MCA M1 segment and bifurcation are patent without stenosis. Left MCA branches are within normal limits. Right MCA M1 segment and bifurcation are patent without stenosis. Right MCA branches are within normal  limits. Venous sinuses: Early contrast timing but grossly patent. Anatomic variants: Fetal left PCA origin. Mildly dominant right ACA A1. Review of the MIP images confirms the above findings IMPRESSION: 1. Right lentiform intra-axial hemorrhage has slightly enlarged since presentation, estimated blood volume now 43 mL (previously 31 mL). But there is no CTA spot sign, and mild regional edema and mass effect are stable. 2. 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 There is up to Moderate stenosis of the Right supraclinoid ICA due to calcified plaque. And there is Moderate To Severe bilateral PCA P3 segment stenosis. No significant Vertebrobasilar or cervical carotid stenosis. 3. Aortic Atherosclerosis (ICD10-I70.0). Electronically Signed   By: Genevie Ann M.D.   On: 04/12/2021 06:27   CT HEAD WO CONTRAST  Result Date: 04/14/2021 CLINICAL DATA:  Intracranial hemorrhage follow-up EXAM: CT HEAD WITHOUT CONTRAST TECHNIQUE: Contiguous axial images were obtained from the base of the skull through the vertex without intravenous contrast. COMPARISON:  CT head 04/23/2021.  MRI head 04/13/2021 FINDINGS: Brain: High-density acute hemorrhage in the right lateral basal ganglia measures approximately 5.7 x 2.5 x 3.6 cm. Hematoma size is stable. There is surrounding low-density edema. There is progressive mass-effect on the right lateral ventricle. Mild midline shift to the left slightly increased. Chronic lacunar infarct in the head of caudate on the left and in the left subinsular white matter, unchanged. No acute ischemic infarct or mass identified. Vascular: Negative for hyperdense vessel Skull: Negative Sinuses/Orbits: Mucosal  edema paranasal sinuses.  Negative orbit Other: None IMPRESSION: Large hematoma right external capsule unchanged in size. There is increased mass-effect and edema with mild midline shift to the left. Electronically Signed   By: Franchot Gallo M.D.    On: 04/14/2021 14:52   MR BRAIN WO CONTRAST  Result Date: 04/13/2021 CLINICAL DATA:  80 year old male code stroke presentation on 04/09/2021 with right lentiform intra-axial hemorrhage. EXAM: MRI HEAD WITHOUT CONTRAST TECHNIQUE: Multiplanar, multiecho pulse sequences of the brain and surrounding structures were obtained without intravenous contrast. COMPARISON:  CTA head and neck 04/12/2021 and earlier. FINDINGS: Brain: Mix signal intra-axial hemorrhage centered at the right lentiform encompasses 68 by 26 by 45 mm (AP by transverse by CC) for an estimated blood volume a 40 mL, stable since 04/12/2021. Surrounding edema. Susceptibility artifact along the margins of the hematoma on DWI. No larger area of restricted diffusion surrounding the blood. No restricted diffusion elsewhere. Stable mass effect on the right lateral ventricle. No intraventricular extension. No extra-axial extension. Mild leftward midline shift of 3-4 mm is stable. No ventriculomegaly. Basilar cisterns remain patent. No chronic cerebral blood products elsewhere on T2* imaging. 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 No cortical encephalomalacia. Brainstem and cerebellum appear negative. Cervicomedullary junction and pituitary are within normal limits. Vascular: Major intracranial vascular flow  voids are preserved. Skull and upper cervical spine: Negative visible cervical spine. Visualized bone marrow signal is within normal limits. Sinuses/Orbits: Negative orbits. Moderate ethmoid and sphenoid sinus mucosal thickening. Mild maxillary sinus involvement. Other: Mastoids are clear. Visible internal auditory structures appear normal. Negative visible scalp and face. IMPRESSION: 1. Right lentiform intra-axial hemorrhage with estimated blood volume 40 mL is stable since yesterday. Susceptibility along the  margins on DWI with no larger area of infarction. Stable intracranial mass effect with mild leftward midline shift. 2. No underlying chronic cerebral blood products, but there are contralateral chronic lacunar infarcts in the left basal ganglia. Electronically Signed   By: Genevie Ann M.D.   On: 04/13/2021 11:50   DG CHEST PORT 1 VIEW  Result Date: 04/15/2021 CLINICAL DATA:  Ventilator dependent.  Evaluate endotracheal tube. EXAM: PORTABLE CHEST 1 VIEW COMPARISON:  04/13/2021 FINDINGS: Endotracheal tube is in place with tip 3.8 centimeters above the carina. Nasogastric tube is in place with tip beyond the edge of the image, at least to the level of the proximal stomach. RIGHT-sided PICC line tip overlies the level of the superior vena cava. Heart size is accentuated by portable technique. There has been interval development of patchy infiltrates in the LOWER lobes bilaterally, consistent with infectious infiltrates. No evidence for pulmonary edema. IMPRESSION: Interval development of bilateral LOWER lobe infiltrates. Electronically Signed   By: Nolon Nations M.D.   On: 04/15/2021 13:26   DG CHEST PORT 1 VIEW  Result Date: 04/13/2021 CLINICAL DATA:  Check endotracheal tube placement EXAM: PORTABLE CHEST 1 VIEW COMPARISON:  04/12/2021 FINDINGS: Endotracheal tube is noted in place 16 mm above the carina. Gastric catheter is noted coiled within the stomach. Cardiac shadow is stable. The lungs are well aerated bilaterally. Mild central vascular congestion is seen without edema. IMPRESSION: Endotracheal tube in satisfactory position. Gastric catheter within the stomach. Mild vascular congestion without interstitial edema. Electronically Signed   By: Inez Catalina M.D.   On: 04/13/2021 21:55   DG CHEST PORT 1 VIEW  Result Date: 04/12/2021 CLINICAL DATA:  80 year old male with intracranial hemorrhage. Hypoxia. EXAM: PORTABLE CHEST 1 VIEW COMPARISON:  Chest radiographs 08/28/2006. CTA neck today reported  separately. FINDINGS: Portable AP semi upright view at 0415 hours. Mildly lower lung volumes compared to 2007. Normal cardiac size and mediastinal contours. Visualized tracheal air column is within normal limits. Allowing for portable technique the lungs are clear. No pneumothorax or pleural effusion. Chronic left anterior rib cerclage wires are stable. Paucity of bowel gas in the upper abdomen. IMPRESSION: No acute cardiopulmonary abnormality. Electronically Signed   By: Genevie Ann M.D.   On: 04/12/2021 06:29   DG Swallowing Func-Speech Pathology  Result Date: 04/12/2021 Formatting of this result is different from the original. Objective Swallowing Evaluation: Type of Study: MBS-Modified Barium Swallow Study  Patient Details Name: Richard Summers MRN: [id number] Date of Birth: [birth year] Today's Date: 04/12/2021 Time: SLP Start Time (ACUTE ONLY): 76 -SLP Stop Time (ACUTE ONLY): 1323 SLP Time Calculation (min) (ACUTE ONLY): 16 min Past Medical History: Past Medical History: Diagnosis Date  Allergic rhinitis   Allergy   Barrett's esophagus   Cataract cortical, senile   Coronary artery disease involving coronary bypass graft of native heart   Epilepsy (HCC)   GERD (gastroesophageal reflux disease)   Glaucoma   Hyperlipidemia   Hypertension   Insomnia   MGUS (monoclonal gammopathy of unknown significance)   Myocardial infarction ([place])   Nephrolithiasis   Osteoporosis   S/P triple  vessel bypass   Sleep apnea   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 Plates and screws installed  TONSILLECTOMY    triple bypass    VASECTOMY   HPI: 80 yr old man  with history of epilepsy, Barrett's esophagus, GERD, HTN, MI s/p bypass, and OSA who had a fall in the shower with sudden left sided  hemiplegia with a fixed rt  gaze. CT revealed right Basal Ganglia hemorrhage. CT repeated am 7/13 with right lentiform intra-axial hemorrhage has slightly enlarged since presentation.  No data recorded Assessment / Plan / Recommendation CHL IP CLINICAL IMPRESSIONS 04/12/2021 Clinical Impression Oral phase of swallow marked by decreased cohesion, ROM and manipulation resulted in anterior spill, left lateral sulci residue, lingual residue, holding and transit delays. Lingual residue spilled to valleculae and cleared with cues and spontaneous swallows. Pharyngeal and laryngeal mobility and strength were adequate once swallow was initiated. Poor timing to close larynx occured with thin liquid barium resulting in incomplete laryngeal closure and aspiration with reflexive cough (PAS 7) unable to clear trachea. Pt was very restless, pushing to his left, distracted and therapist unable to attempt compensatory strategies during study. Recommend Dys 1 (puree), nectar thick liquids, crush pills, check left buccal cavity for pocketing, limit distractions, straws allowed and full supervision and assist. SLP Visit Diagnosis Dysphagia, oropharyngeal phase (R13.12) Attention and concentration deficit following -- Frontal lobe and executive function deficit following -- Impact on safety and function Moderate aspiration risk;Mild aspiration risk   CHL IP TREATMENT RECOMMENDATION 04/12/2021 Treatment Recommendations Therapy as outlined in treatment plan below   Prognosis 04/12/2021 Prognosis for Safe Diet Advancement Good Barriers to Reach Goals Cognitive deficits Barriers/Prognosis Comment -- CHL IP DIET RECOMMENDATION 04/12/2021 SLP Diet Recommendations Dysphagia 1 (Puree) solids;Nectar thick liquid Liquid Administration via Straw;Cup Medication Administration Crushed with puree Compensations Minimize environmental distractions;Slow rate;Small sips/bites;Lingual sweep for clearance of pocketing;Monitor for anterior loss Postural Changes Seated upright at 90  degrees   CHL IP OTHER RECOMMENDATIONS 04/12/2021 Recommended Consults -- Oral Care Recommendations Oral care BID Other Recommendations --   CHL IP FOLLOW UP RECOMMENDATIONS 04/12/2021 Follow up Recommendations Inpatient Rehab   CHL IP FREQUENCY AND DURATION 04/12/2021 Speech Therapy Frequency (ACUTE ONLY) min 2x/week Treatment Duration 2 weeks      CHL IP ORAL PHASE 04/12/2021 Oral Phase Impaired Oral - Pudding Teaspoon -- Oral - Pudding Cup -- Oral - Honey Teaspoon -- Oral - Honey Cup -- Oral - Nectar Teaspoon Left anterior bolus loss;Reduced posterior propulsion;Decreased bolus cohesion;Left pocketing in lateral sulci;Lingual/palatal residue Oral - Nectar Cup Left anterior bolus loss;Reduced posterior propulsion;Decreased bolus cohesion;Left pocketing in lateral sulci;Lingual/palatal residue Oral - Nectar Straw Reduced posterior propulsion;Delayed oral transit;Lingual/palatal residue Oral - Thin Teaspoon Left anterior bolus loss;Reduced posterior propulsion;Decreased bolus cohesion;Holding of bolus Oral - Thin Cup Left anterior bolus loss;Reduced posterior propulsion;Decreased bolus cohesion;Holding of bolus Oral - Thin Straw -- Oral - Puree Holding of bolus;Lingual/palatal residue;Delayed oral transit Oral - Mech Soft -- Oral - Regular Left pocketing in lateral sulci;Delayed oral transit Oral - Multi-Consistency -- Oral - Pill -- Oral Phase - Comment --  CHL IP PHARYNGEAL PHASE 04/12/2021 Pharyngeal Phase Impaired Pharyngeal- Pudding Teaspoon -- Pharyngeal -- Pharyngeal- Pudding Cup -- Pharyngeal -- Pharyngeal- Honey  Teaspoon -- Pharyngeal -- Pharyngeal- Honey Cup -- Pharyngeal -- Pharyngeal- Nectar Teaspoon WFL Pharyngeal -- Pharyngeal- Nectar Cup Pharyngeal residue - valleculae Pharyngeal -- Pharyngeal- Nectar Straw WFL Pharyngeal -- Pharyngeal- Thin Teaspoon WFL Pharyngeal -- Pharyngeal- Thin Cup Penetration/Aspiration during swallow Pharyngeal Material enters airway, passes BELOW cords and not ejected out despite  cough attempt by patient Pharyngeal- Thin Straw -- Pharyngeal -- Pharyngeal- Puree -- Pharyngeal -- Pharyngeal- Mechanical Soft -- Pharyngeal -- Pharyngeal- Regular -- Pharyngeal -- Pharyngeal- Multi-consistency -- Pharyngeal -- Pharyngeal- Pill -- Pharyngeal -- Pharyngeal Comment --  No flowsheet data found. [place] Siren 04/12/2021, 2:31 PM              ECHOCARDIOGRAM COMPLETE  Result Date: 04/12/2021    ECHOCARDIOGRAM REPORT   Patient Name:   Richard Summers Date of Exam: 04/12/2021 Medical Rec #:  [id number]      Height:       66.0 in Accession #:    [id number]     Weight:       164.9 lb Date of Birth:  November 09, 1940      BSA:          1.842 m Patient Age:    17 years       BP:           146/75 mmHg Patient Gender: M              HR:           80 bpm. Exam Location:  Inpatient Procedure: 2D Echo, Cardiac Doppler and Color Doppler Indications:    Stroke I63.9  History:        Patient has no prior history of Echocardiogram examinations. CAD                 and Previous Myocardial Infarction; Risk Factors:Dyslipidemia                 and Hypertension.  Sonographer:    Bernadene Person RDCS Referring Phys: [id number] [place]  1. Left ventricular ejection fraction, by estimation, is 45%. The left ventricle demonstrates regional wall motion abnormalities with basal inferior akinesis, basal to mid inferolateral akinesis, basal anterolateral hypokinesis. There is mild left ventricular hypertrophy. Left ventricular diastolic parameters are consistent with Grade I diastolic dysfunction (impaired relaxation).  2. Right ventricular systolic function is normal. The right ventricular size is normal. Tricuspid regurgitation signal is inadequate for assessing PA pressure.  3. Left atrial size was mildly dilated.  4. The mitral valve is normal in structure. Trivial mitral valve regurgitation. No evidence of mitral stenosis.  5. The aortic valve is tricuspid. Aortic valve regurgitation is not visualized. Mild  aortic valve sclerosis is present, with no evidence of aortic valve stenosis. FINDINGS  Left Ventricle: Left ventricular ejection fraction, by estimation, is 45%. The left ventricle has mildly decreased function. The left ventricle demonstrates regional wall motion abnormalities. The left ventricular internal cavity size was normal in size. There is mild left ventricular hypertrophy. Left ventricular diastolic parameters are consistent with Grade I diastolic dysfunction (impaired relaxation). Right Ventricle: The right ventricular size is normal. No increase in right ventricular wall thickness. Right ventricular systolic function is normal. Tricuspid regurgitation signal is inadequate for assessing PA pressure. Left Atrium: Left atrial size was mildly dilated. Right Atrium: Right atrial size was normal in size. Pericardium: There is no evidence of pericardial effusion. Mitral Valve: The mitral valve is normal in structure. Mild mitral annular calcification. Trivial mitral valve  regurgitation. No evidence of mitral valve stenosis. Tricuspid Valve: The tricuspid valve is normal in structure. Tricuspid valve regurgitation is not demonstrated. Aortic Valve: The aortic valve is tricuspid. Aortic valve regurgitation is not visualized. Mild aortic valve sclerosis is present, with no evidence of aortic valve stenosis. Pulmonic Valve: The pulmonic valve was normal in structure. Pulmonic valve regurgitation is not visualized. Aorta: The aortic root is normal in size and structure. Venous: The inferior vena cava was not well visualized. IAS/Shunts: No atrial level shunt detected by color flow Doppler.  LEFT VENTRICLE PLAX 2D LVIDd:         3.90 cm LVIDs:         2.70 cm LV PW:         1.10 cm LV IVS:        1.00 cm LVOT diam:     2.30 cm LV SV:         74 LV SV Index:   40 LVOT Area:     4.15 cm  RIGHT VENTRICLE RV S prime:     12.50 cm/s TAPSE (M-mode): 1.7 cm LEFT ATRIUM             Index       RIGHT ATRIUM           Index  LA diam:        5.00 cm 2.71 cm/m  RA Area:     17.70 cm LA Vol (A2C):   55.0 ml 29.85 ml/m RA Volume:   51.20 ml  27.79 ml/m LA Vol (A4C):   59.6 ml 32.35 ml/m LA Biplane Vol: 60.6 ml 32.89 ml/m  AORTIC VALVE LVOT Vmax:   97.13 cm/s LVOT Vmean:  61.600 cm/s LVOT VTI:    0.178 m  AORTA Ao Root diam: 3.70 cm Ao Asc diam:  3.00 cm  SHUNTS Systemic VTI:  0.18 m Systemic Diam: 2.30 cm Loralie Champagne MD Electronically signed by Loralie Champagne MD Signature Date/Time: 04/12/2021/3:29:37 PM    Final    CT HEAD CODE STROKE WO CONTRAST  Result Date: 04/14/2021 CLINICAL DATA:  Code stroke. EXAM: CT HEAD WITHOUT CONTRAST TECHNIQUE: Contiguous axial images were obtained from the base of the skull through the vertex without intravenous contrast. COMPARISON:  None. FINDINGS: Brain: Acute parenchymal hemorrhage is present centered in the right basal ganglia region with extension into the corona radiata superiorly and temporal lobe inferiorly. Mild surrounding edema. Regional mass effect is present with partial effacement of the right lateral ventricle. No substantial midline shift. No intraventricular extension. No hydrocephalus. Gray-white differentiation is preserved. 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 Additional patchy low-attenuation in the supratentorial white matter probably reflects chronic microvascular ischemic changes. Vascular: There is intracranial atherosclerotic calcification at the skull base. Skull: Unremarkable. Sinuses/Orbits: Ethmoid dominant mucosal thickening. Orbits are unremarkable. Other: Mastoid air cells are clear. IMPRESSION: Acute parenchymal hemorrhage centered in the right basal ganglia region with mild edema and regional mass effect. No intraventricular extension. These results were communicated to Dr. Curly Shores at 6:11 pm on 04/24/2021 by text page via the [facility] messaging system. Electronically Signed   By: Macy Mis M.D.   On: 04/04/2021 18:15   [place] EKG SITE  RITE  Result Date: 04/14/2021 If Site Rite image not attached, placement could not be confirmed due to current cardiac rhythm.  CT ANGIO NECK CODE STROKE  Result Date: 04/12/2021 CLINICAL DATA:  80 year old male code stroke presentation on 04/07/2021 with right lentiform intra-axial hemorrhage. Subsequent encounter.  EXAM: CT ANGIOGRAPHY HEAD AND NECK TECHNIQUE: Multidetector CT imaging of the head and neck was performed using the standard protocol during bolus administration of intravenous contrast. Multiplanar CT image reconstructions and MIPs were obtained to evaluate the vascular anatomy. Carotid stenosis measurements (when applicable) are obtained utilizing NASCET criteria, using the distal internal carotid diameter as the denominator. CONTRAST:  17mL OMNIPAQUE IOHEXOL 350 MG/ML SOLN COMPARISON:  Head CT 04/21/2021. FINDINGS: CT HEAD Brain: Calcified atherosclerosis at the skull base. Hyperdense intra-axial hemorrhage centered at the right lentiform encompasses 62 by 28 x 49 mm (AP by transverse by CC) for an estimated blood volume of 43 mL, and has mildly extended since presentation (approximately 31 mL at that time). But surrounding edema and regional mass effect have not significantly changed. Partially effaced right lateral ventricle with no ventriculomegaly. No intraventricular or extra-axial extension of blood. No significant midline shift. Basilar cisterns remain patent. Stable gray-white matter differentiation elsewhere. 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 Calvarium and skull base: Intact, negative. Paranasal sinuses: Continued paranasal sinus opacification maximal in the ethmoids. Tympanic cavities and mastoids remain clear. Orbits: Visualized orbits and scalp soft tissues are within normal limits. Negative noncontrast deep soft tissue spaces of the face. CTA NECK Skeleton: Right side TMJ degeneration. Widespread cervical spine degeneration. No acute or suspicious osseous lesion  identified. Upper chest: Mild upper lobe lung scarring mostly on the left. Retained secretions, bubbly opacity in the visible trachea. No superior mediastinal lymphadenopathy. Other neck: Negative.  No neck mass or lymphadenopathy. Aortic arch: Extensive soft and calcified arch atherosclerosis. Three vessel arch configuration. Great vessel origins relatively spared. Right carotid system: Mild brachiocephalic artery and right CCA origin atherosclerosis without stenosis. Mildly tortuous right CCA. Mostly calcified plaque at the right carotid bifurcation and posterior right ICA bulb without stenosis. Mildly tortuous ICA to the skull base. Left carotid system: Minimal plaque at the left CCA origin without stenosis. Soft plaque in the medial vessel at the level of the larynx on series 5, image 131 without stenosis. Minor plaque at the left ICA origin without stenosis. Mildly tortuous left ICA to the skull base. Vertebral arteries: Soft and calcified plaque in the proximal right subclavian artery without stenosis. Right vertebral artery origin remains normal. Tortuous right V1 and proximal V2 segments. Patent right vertebral artery to the skull base without significant plaque or stenosis. Soft and calcified plaque in the proximal left subclavian artery without stenosis. Mild involvement of the left vertebral artery origin without stenosis. Tortuous left V1 and proximal V2 segments. Codominant left vertebral artery is patent to the skull base without significant plaque or stenosis. CTA HEAD Posterior circulation: Codominant distal vertebral arteries with mild calcified plaque mostly on the left. No significant stenosis. Patent PICA origins and vertebrobasilar junction. Patent basilar artery with mild irregularity but no significant stenosis. Patent SCA and right PCA origins. Fetal left PCA origin. Right posterior communicating artery diminutive or absent. Bilateral P2 segments are mildly irregular without stenosis. But  there is bilateral P3 segment stenosis, mild to moderate involving the inferior division on the left (series 10, image 31) and moderate to severe involving the right P3 trunk on series 10, image 26. Anterior circulation: Both ICA siphons are patent. Abundant siphon calcified plaque on both sides but no significant stenosis on the left. There is moderate stenosis of the right supraclinoid ICA on series 7, image 93. Normal left posterior communicating artery. Patent carotid termini. MCA and ACA origins are within normal limits. No convincing CTA  spot sign associated with the right lentiform hemorrhage. Mildly dominant and tortuous right ACA A1 segment. Mild left A1 irregularity without stenosis. 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 Otherwise the bilateral ACA branches are within normal limits. Left MCA M1 segment and bifurcation are patent without stenosis. Left MCA branches are within normal limits. Right MCA M1 segment and bifurcation are patent without stenosis. Right MCA branches are within normal limits. Venous sinuses: Early contrast timing but grossly patent. Anatomic variants: Fetal left PCA origin. Mildly dominant right ACA A1. Review of the MIP images confirms the above findings IMPRESSION: 1. Right lentiform intra-axial hemorrhage has slightly enlarged since presentation, estimated blood volume now 43 mL (previously 31 mL). But there is no CTA spot sign, and mild regional edema and mass effect are stable. 2. 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 There is up to Moderate stenosis of the Right supraclinoid ICA due to calcified plaque. And there is Moderate To Severe bilateral PCA P3 segment stenosis. No significant Vertebrobasilar or cervical carotid stenosis. 3. Aortic Atherosclerosis (ICD10-I70.0). Electronically Signed   By: Genevie Ann M.D.   On: 04/12/2021 06:27       PHYSICAL EXAM  Temp:  [98.8 F (37.1 C)-100.9 F (38.3 C)] 100.1 F (37.8 C) (07/20 1200) Pulse Rate:  [64-105] 104 (07/20 1500) Resp:  [20-32] 28 (07/20 1500) BP: (104-153)/(59-90) 130/67 (07/20 1500) SpO2:  [75 %-95 %] 75 % (07/20 1500) FiO2 (%):  [40 %] 40 % (07/20 1050) Weight:  [77.6 kg] 77.6 kg (07/20 0419)  General -elderly Caucasian male, intubated on sedation.  Ophthalmologic - fundi not visualized due to noncooperation.  Cardiovascular - Regular rate and rhythm, not in afib today.  Neuro - intubated on sedation, eyes closed, but awakens easily following commands. With forced eye opening, eyes in mid position, able to look to the left only to midline.  Right gaze deviation not blinking to visual threat, doll's eyes sluggish, not tracking, PERRL. Corneal reflex weakly present bilaterally, gag and cough present. Breathing over the vent.  Facial symmetry not able to test due to ET tube.  Tongue protrusion not cooperative. On pain stimulation, able to move right side well and trace movement in the left side.  We will follow only occasional commands on the right DTR diminished and no babinski. Sensation, coordination and gait not tested.   ASSESSMENT/PLAN Richard Summers is a 80 y.o. male with history of hypertension, hyperlipidemia, MI status post CABG x3, OSA, seizure on Keppra admitted for left-sided weakness, left neglect, fall. No tPA given due to [place].    ICH:  right BG ICH likely hypertensive etiology CT head right BG ICH CTA head and neck moderate stenosis of the right supraclinoid ICA, moderate to severe bilateral P3.  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 CT repeat showed slight increase of right BG hemorrhage, no CT spot sign, mild regional edema and mass-effect MRI stable right BG hematoma, minimal midline shift CT head repeat 7/15 large stable hematoma right BG, increased mass-effect with edema and mild midline shift 2D Echo EF 45% LDL 149 HgbA1c  6.2 SCDs for VTE prophylaxis No antithrombotic prior to admission, now on No antithrombotic due to [place] Ongoing aggressive stroke risk factor management Therapy recommendations: Pending Disposition: Pending  Cerebral edema CT head repeat showed slight increase of right BG ICH, mild regional edema and mass-effect Given the  size of ICH, put on 3% saline with Na goal 150-155. This was stopped and are currently trending Na On FW MRI stable right BG hematoma, minimal midline shift  Respiratory failure ?  Aspiration Intubated overnight On vent and sedation CCM on board  New diagnosed paroxysmal A. fib EKG showed A. Fib  Telemetry A. Fib -> now sinus Rate controlled Not AC candidate  History of seizure On home Keppra XR 2000 daily Continue Keppra 1 g twice daily -> 500 bid for renal adjustment  AKI Urinary retention Creatinine 0.75-1.1-1.79-2.34-2.77-1.54 Could be post renal Foley catheter placed On FW CCM following  Hypertension & hypotension Stable on Cleviprex -> Neo  BP goal < 160 Avoid low BP CCM on board Long term BP goal normotensive  Hyperlipidemia Home meds: Lipitor 10 LDL 149, goal < 70 Hold off Lipitor for now Consider to resume statin at discharge  Dysphagia Was on dysphagia 1 with nectar thick liquid Not n.p.o. Speech on board Consider tube feeding when stable  Other Stroke Risk Factors Advanced age Coronary artery disease/MI status post CABG x3 Obstructive sleep apnea, on CPAP at home  Other Active Problems MGUS Hypokalemia, K 3.2-> supplement ->3.6   Hospital day # 8 Plan continue weaning off ventilatory support as per critical care team and one-way extubation and no reintubation as per palliative care team's discussion with patient's wife who herself unfortunately is sick and is being discharged from [facility].  But plans to visit today prior to his extubation.Appreciate help from palliative care team to determine goals of care .   Continue strict blood pressure control with systolic below 144.  Wean off ventilatory support and extubate as per critical care team.  No family available at the bedside for discussion.   Discussed with Dr. Tacy Learn critical care team MD This patient is critically ill due to respiratory failure, right large BG ICH, aspiration, AKI, hypotension, cerebral edema new diagnosed A. fib and at significant risk of neurological worsening, death form brain herniation, hematoma expansion, renal failure, recurrent stroke, heart failure. This patient's care requires constant monitoring of vital signs, hemodynamics, respiratory and cardiac monitoring, review of multiple databases, neurological assessment, discussion with family, other specialists and medical decision making of high complexity. I spent 30 minutes of neurocritical care time in the care of this patient.  I discussed with CCM team   Antony Contras, MD Stroke Neurology 27-Apr-2021 4:31 PM    To contact Stroke Continuity provider, please refer to [email or web address] After hours, contact General Neurology

## 2021-05-01 NOTE — Progress Notes (Signed)
RT note- Patient found on full support at this time.

## 2021-05-01 NOTE — Progress Notes (Signed)
RT note-Patient was extubated for comfort care at this time, O2 was placed initially and now is on room air.

## 2021-05-01 DEATH — deceased

## 2021-08-16 ENCOUNTER — Other Ambulatory Visit: Payer: Medicare Other

## 2022-02-13 ENCOUNTER — Ambulatory Visit: Payer: Medicare Other | Admitting: Oncology

## 2022-02-13 ENCOUNTER — Other Ambulatory Visit: Payer: Medicare Other

## 2022-03-05 ENCOUNTER — Encounter: Payer: Medicare Other | Admitting: Dermatology

## 2023-03-18 IMAGING — CT CT HEAD W/O CM
4 series · 16 of 47 positions shown, 18 images · non-contrast
Comparison: CT head 04/11/2021.  MRI head 04/13/2021

CLINICAL DATA: Intracranial hemorrhage follow-up

EXAM:
CT HEAD WITHOUT CONTRAST
TECHNIQUE: Contiguous axial images were obtained from the base of the skull
through the vertex without intravenous contrast.

[Series 3: head without · axial · non-contrast · 0.46mm/px · z∈[-138,-28]mm · 6 of 32 slices shown, 8 images]
[im 5/32  brain]
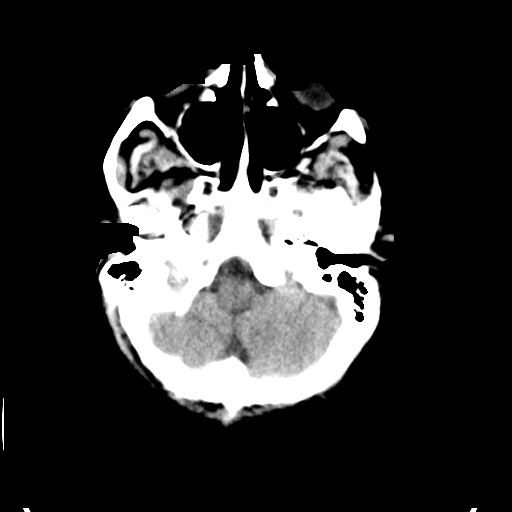
[im 5/32  bone]
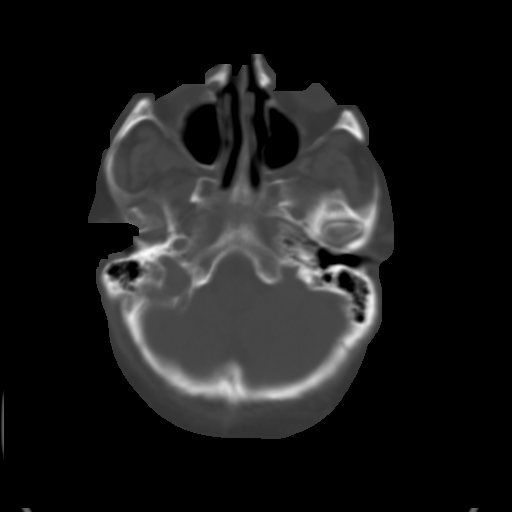
[im 9/32  brain]
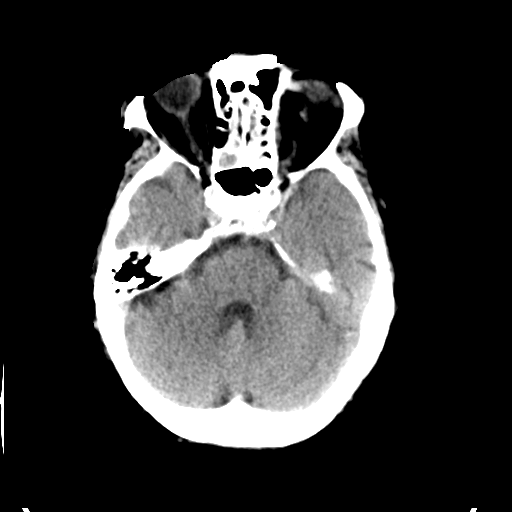
[im 14/32  brain]
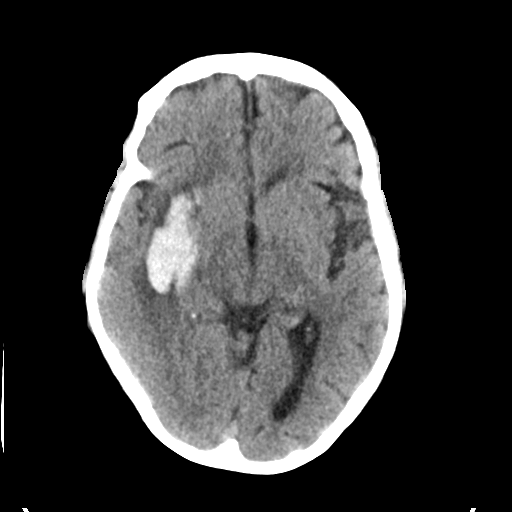
[im 18/32  brain]
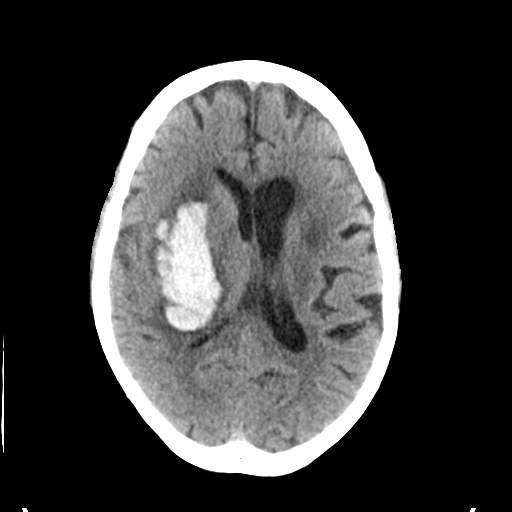
[im 23/32  brain]
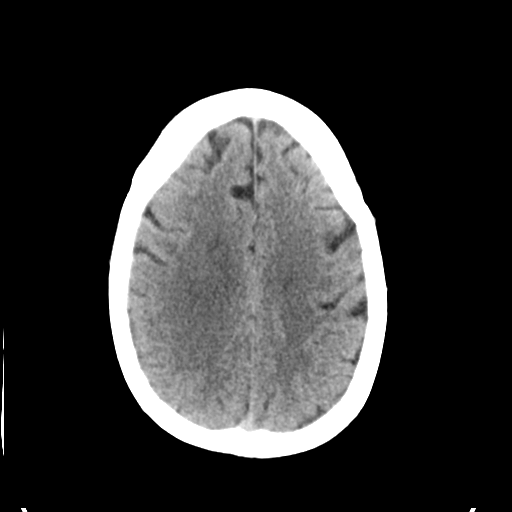
[im 23/32  bone]
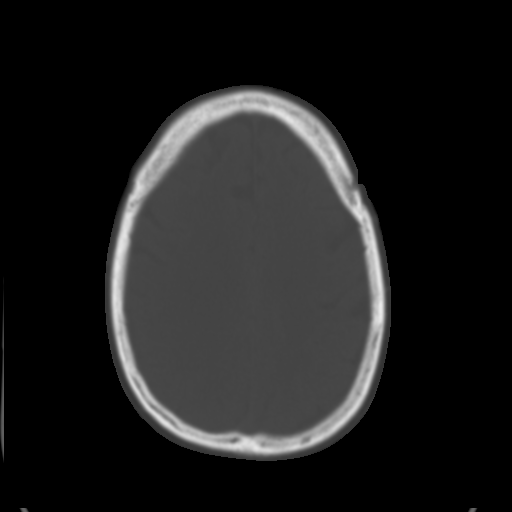
[im 27/32  brain]
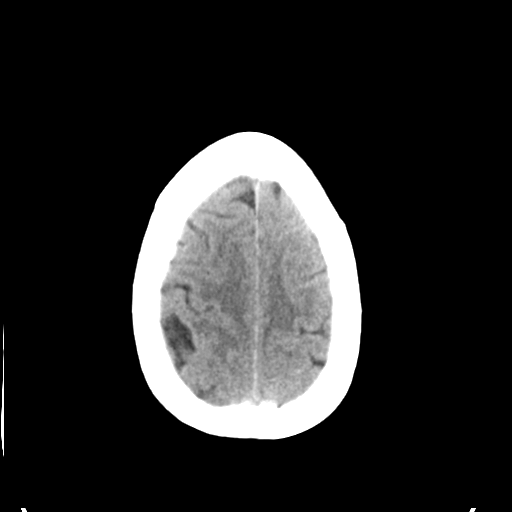

[Series 4: head bone · axial · 0.46mm/px · z∈[-144,-90]mm · 4 of 81 slices shown]
[im 8/81  bone]
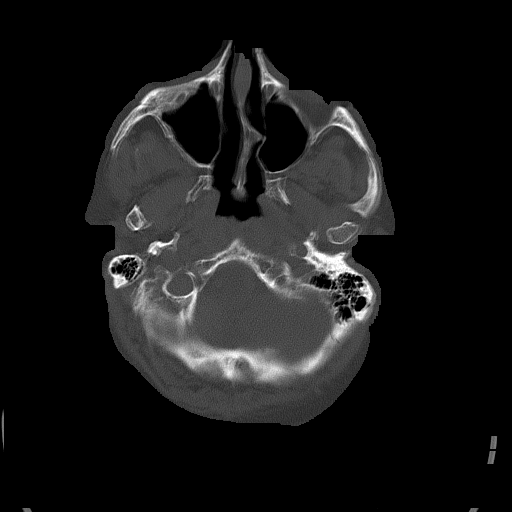
[im 16/81  bone]
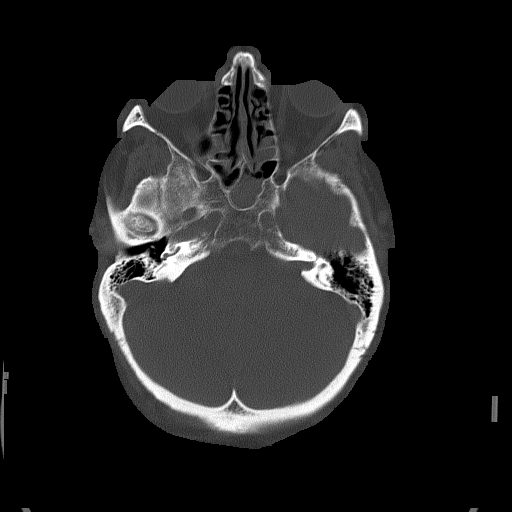
[im 27/81  bone]
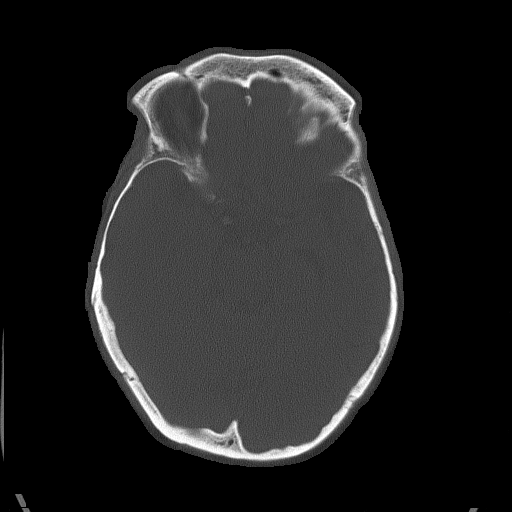
[im 35/81  bone]
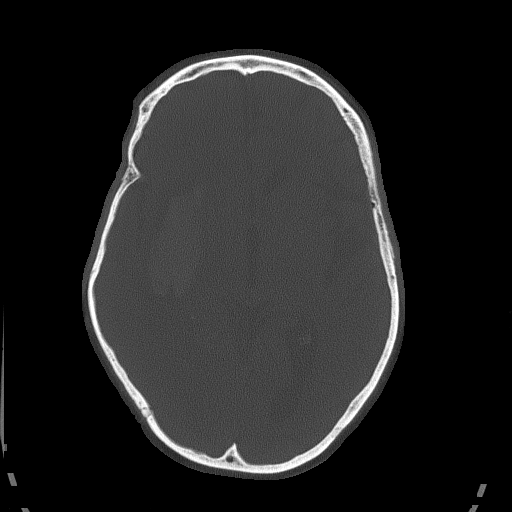

[Series 5: head without cor · coronal · non-contrast · 0.33mm/px · 3 of 72 slices shown]
[im 24/72  brain]
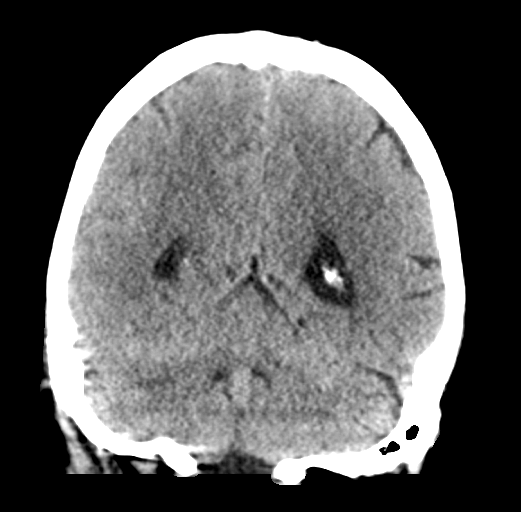
[im 32/72  brain]
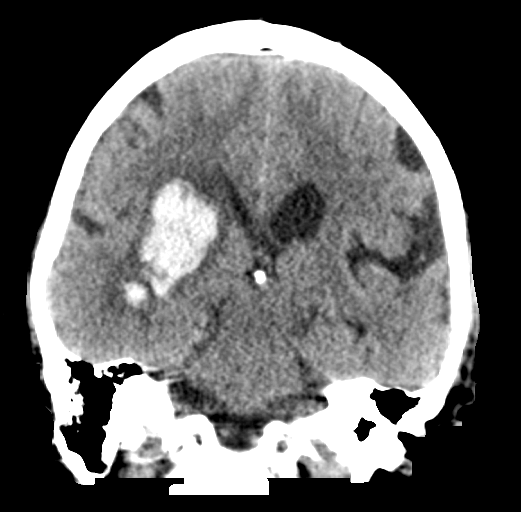
[im 40/72  brain]
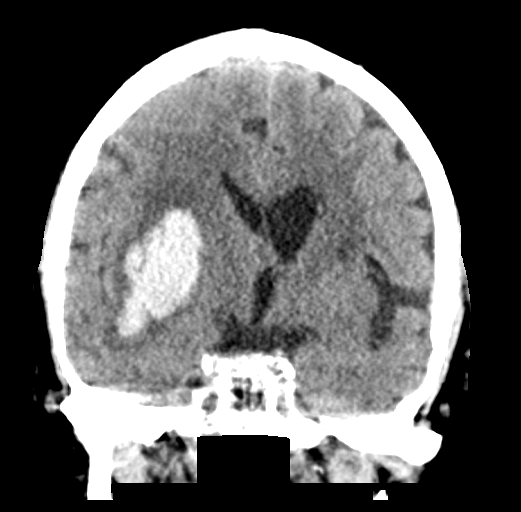

[Series 6: head without sag · sagittal · non-contrast · 0.32mm/px · 3 of 59 slices shown]
[im 20/59  brain]
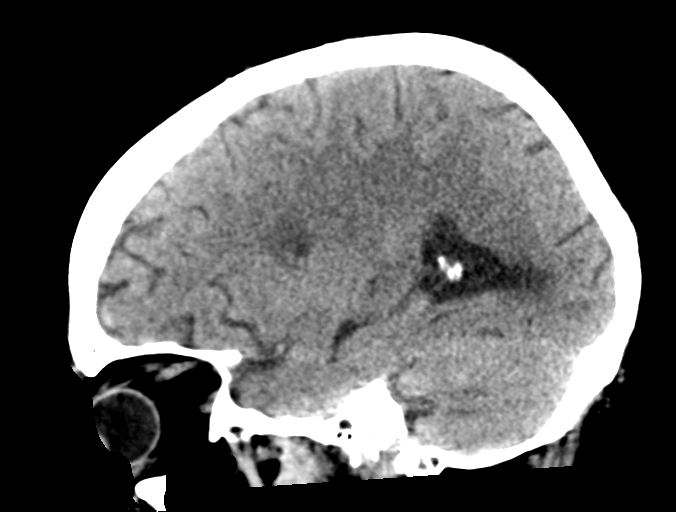
[im 30/59  brain]
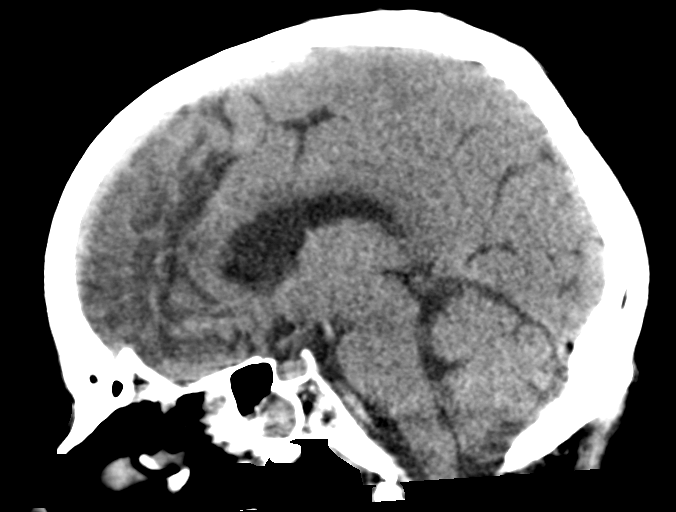
[im 39/59  brain]
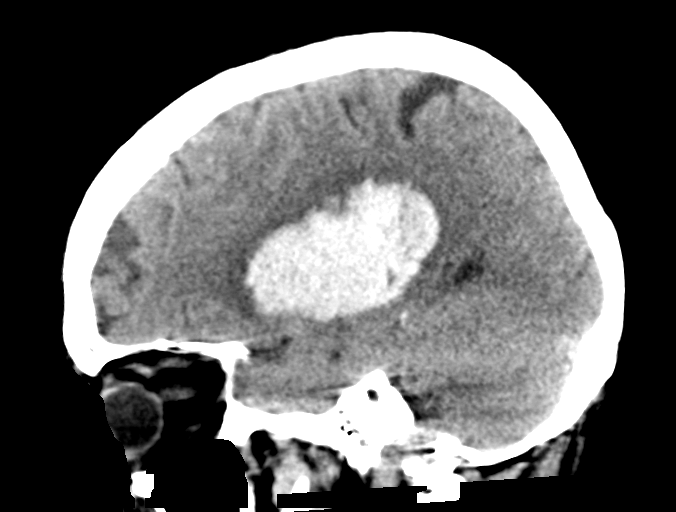

[16 of 47 positions shown; findings below may reference images not displayed]

FINDINGS: Brain: High-density acute hemorrhage in the right lateral basal
ganglia measures approximately 5.7 x 2.5 x 3.6 cm. Hematoma size is
stable. There is surrounding low-density edema.

There is progressive mass-effect on the right lateral ventricle.
Mild midline shift to the left slightly increased.

Chronic lacunar infarct in the head of caudate on the left and in
the left subinsular white matter, unchanged. No acute ischemic
infarct or mass identified.

Vascular: Negative for hyperdense vessel

Skull: Negative

Sinuses/Orbits: Mucosal  edema paranasal sinuses.  Negative orbit

Other: None
IMPRESSION: Large hematoma right external capsule unchanged in size. There is
increased mass-effect and edema with mild midline shift to the left.
